# Patient Record
Sex: Male | Born: 1940 | Race: White | Hispanic: No | Marital: Married | Smoking: Former smoker
Health system: Southern US, Community
[De-identification: ages and names within clinical notes are randomized; demographics above are authoritative.]

## PROBLEM LIST (undated history)

## (undated) DIAGNOSIS — E782 Mixed hyperlipidemia: Secondary | ICD-10-CM

## (undated) DIAGNOSIS — E785 Hyperlipidemia, unspecified: Secondary | ICD-10-CM

## (undated) DIAGNOSIS — M1990 Unspecified osteoarthritis, unspecified site: Secondary | ICD-10-CM

## (undated) DIAGNOSIS — I10 Essential (primary) hypertension: Secondary | ICD-10-CM

## (undated) DIAGNOSIS — I2699 Other pulmonary embolism without acute cor pulmonale: Secondary | ICD-10-CM

## (undated) DIAGNOSIS — Z9989 Dependence on other enabling machines and devices: Secondary | ICD-10-CM

## (undated) DIAGNOSIS — E119 Type 2 diabetes mellitus without complications: Secondary | ICD-10-CM

## (undated) DIAGNOSIS — E559 Vitamin D deficiency, unspecified: Secondary | ICD-10-CM

## (undated) DIAGNOSIS — I714 Abdominal aortic aneurysm, without rupture: Secondary | ICD-10-CM

## (undated) DIAGNOSIS — G459 Transient cerebral ischemic attack, unspecified: Secondary | ICD-10-CM

## (undated) DIAGNOSIS — J189 Pneumonia, unspecified organism: Secondary | ICD-10-CM

## (undated) DIAGNOSIS — F0390 Unspecified dementia without behavioral disturbance: Secondary | ICD-10-CM

## (undated) DIAGNOSIS — Z9981 Dependence on supplemental oxygen: Secondary | ICD-10-CM

## (undated) DIAGNOSIS — J45909 Unspecified asthma, uncomplicated: Secondary | ICD-10-CM

## (undated) DIAGNOSIS — G4733 Obstructive sleep apnea (adult) (pediatric): Secondary | ICD-10-CM

## (undated) DIAGNOSIS — K922 Gastrointestinal hemorrhage, unspecified: Secondary | ICD-10-CM

## (undated) DIAGNOSIS — I429 Cardiomyopathy, unspecified: Secondary | ICD-10-CM

## (undated) DIAGNOSIS — I480 Paroxysmal atrial fibrillation: Secondary | ICD-10-CM

## (undated) DIAGNOSIS — Z7189 Other specified counseling: Secondary | ICD-10-CM

## (undated) HISTORY — DX: Other specified counseling: Z71.89

## (undated) HISTORY — DX: Abdominal aortic aneurysm, without rupture: I71.4

## (undated) HISTORY — DX: Vitamin D deficiency, unspecified: E55.9

## (undated) HISTORY — PX: JOINT REPLACEMENT: SHX530

## (undated) HISTORY — PX: BACK SURGERY: SHX140

## (undated) HISTORY — PX: ABDOMINAL SURGERY: SHX537

## (undated) HISTORY — DX: Mixed hyperlipidemia: E78.2

## (undated) HISTORY — PX: TOTAL KNEE ARTHROPLASTY: SHX125

## (undated) HISTORY — DX: Unspecified osteoarthritis, unspecified site: M19.90

---

## 1990-11-13 HISTORY — PX: LUMBAR DISC SURGERY: SHX700

## 1997-11-13 DIAGNOSIS — I714 Abdominal aortic aneurysm, without rupture, unspecified: Secondary | ICD-10-CM

## 1997-11-13 HISTORY — DX: Abdominal aortic aneurysm, without rupture: I71.4

## 1997-11-13 HISTORY — PX: ABDOMINAL SURGERY: SHX537

## 1997-11-13 HISTORY — DX: Abdominal aortic aneurysm, without rupture, unspecified: I71.40

## 2002-09-20 ENCOUNTER — Emergency Department (HOSPITAL_COMMUNITY): Admission: EM | Admit: 2002-09-20 | Discharge: 2002-09-20 | Payer: Self-pay | Admitting: Emergency Medicine

## 2002-11-13 HISTORY — PX: COLONOSCOPY WITH ESOPHAGOGASTRODUODENOSCOPY (EGD): SHX5779

## 2002-12-25 ENCOUNTER — Encounter: Payer: Self-pay | Admitting: Unknown Physician Specialty

## 2002-12-25 ENCOUNTER — Ambulatory Visit (HOSPITAL_COMMUNITY): Admission: RE | Admit: 2002-12-25 | Discharge: 2002-12-25 | Payer: Self-pay | Admitting: Unknown Physician Specialty

## 2003-02-27 ENCOUNTER — Encounter (INDEPENDENT_AMBULATORY_CARE_PROVIDER_SITE_OTHER): Payer: Self-pay | Admitting: Specialist

## 2003-02-27 ENCOUNTER — Ambulatory Visit (HOSPITAL_COMMUNITY): Admission: RE | Admit: 2003-02-27 | Discharge: 2003-02-27 | Payer: Self-pay | Admitting: Gastroenterology

## 2004-10-11 ENCOUNTER — Ambulatory Visit: Payer: Self-pay | Admitting: Family Medicine

## 2004-12-20 ENCOUNTER — Ambulatory Visit: Payer: Self-pay | Admitting: Family Medicine

## 2005-02-09 ENCOUNTER — Ambulatory Visit: Payer: Self-pay | Admitting: Family Medicine

## 2005-03-28 ENCOUNTER — Ambulatory Visit: Payer: Self-pay | Admitting: Family Medicine

## 2005-09-20 ENCOUNTER — Ambulatory Visit: Payer: Self-pay | Admitting: Family Medicine

## 2005-11-22 ENCOUNTER — Ambulatory Visit: Payer: Self-pay | Admitting: Family Medicine

## 2006-01-25 ENCOUNTER — Ambulatory Visit: Payer: Self-pay | Admitting: Family Medicine

## 2006-04-24 ENCOUNTER — Ambulatory Visit: Payer: Self-pay | Admitting: Family Medicine

## 2006-06-18 ENCOUNTER — Ambulatory Visit: Payer: Self-pay | Admitting: Family Medicine

## 2006-10-17 ENCOUNTER — Ambulatory Visit: Payer: Self-pay | Admitting: Family Medicine

## 2006-12-31 ENCOUNTER — Ambulatory Visit: Payer: Self-pay | Admitting: Family Medicine

## 2007-02-25 ENCOUNTER — Ambulatory Visit: Payer: Self-pay | Admitting: Family Medicine

## 2007-04-23 ENCOUNTER — Ambulatory Visit: Payer: Self-pay | Admitting: Family Medicine

## 2011-03-31 NOTE — Op Note (Signed)
Rodney Orozco, Rodney Orozco                          ACCOUNT NO.:  [id number]   MEDICAL RECORD NO.:  [id number]                   PATIENT TYPE:  AMB   LOCATION:  ENDO                                 FACILITY:  [facility]   PHYSICIAN:  John C. Madilyn Fireman, M.D.                 DATE OF BIRTH:  [birth year]   DATE OF PROCEDURE:  02/27/2003  DATE OF DISCHARGE:                                 OPERATIVE REPORT   PROCEDURE:  Esophagogastroduodenoscopy with biopsy.   INDICATION FOR PROCEDURE:  Patient under consideration for EGD after surgery  for perforated ulcer.  We have discussed indications for EGD, and he decided  to hold off on this earlier after treatment for eradication of Helicobacter  but subsequently on CT scan was found to have a questionable inflammatory  process in the right upper quadrant, possibly associated with the hepatic  flexure area of the colon.  We thus decided to perform a colonoscopy for  this reason and for colon cancer screening and decided to pursue EGD as  well.   DESCRIPTION OF PROCEDURE:  The patient was placed in the left lateral  decubitus position and placed on the pulse monitor with continuous low-flow  oxygen delivered by nasal cannula.  He was sedated with 75 mcg IV fentanyl  and 6 mg IV Versed.  The Olympus video endoscope was advanced under direct  vision into the oropharynx and esophagus.  The esophagus was straight and of  normal caliber with the squamocolumnar line at 38 cm.  There was no visible  hiatal hernia, ring, stricture, or other abnormality at the GE junction.  The stomach was entered, and a small amount of liquid secretions were  suctioned from the fundus.  Retroflexed view of the cardia was unremarkable.  The fundus and body appeared normal.  The antrum showed some erythema and  granularity consistent with mild antral gastritis and no focal erosions or  ulcers.  The pylorus was nondeformed and easily allowed passage of the  endoscope tip into the  duodenum.  Both the bulb and second portion were well-  inspected and appeared to be within normal limits, and I could not obviously  identify any surgical changes.  The scope was withdrawn back into the  stomach, and a CLOtest was obtained.  The scope was then withdrawn and the  patient prepared for colonoscopy.  He tolerated the procedure well, and  there were no immediate complications.   IMPRESSION:  1. Antral gastritis.  2. Otherwise normal endoscopy.   PLAN:  1. Await CLOtest  2. Will proceed with colonoscopy.                                               John C. Madilyn Fireman, M.D.    JCH/MEDQ  D:  02/27/2003  T:  02/27/2003  Job:  [id number]

## 2011-03-31 NOTE — Op Note (Signed)
Rodney Orozco, Rodney Orozco                          ACCOUNT NO.:  [id number]   MEDICAL RECORD NO.:  [id number]                   PATIENT TYPE:  AMB   LOCATION:  ENDO                                 FACILITY:  [facility]   PHYSICIAN:  John C. Madilyn Fireman, M.D.                 DATE OF BIRTH:  August 27, 1941   DATE OF PROCEDURE:  02/27/2003  DATE OF DISCHARGE:                                 OPERATIVE REPORT   PROCEDURE:  Colonoscopy.   INDICATIONS FOR PROCEDURE:  Colon cancer screening.  Also, the patient had a  questionable area of inflammation or thickening in right upper quadrant felt  possibly to be involving the hepatic flexure area of the colon.   DESCRIPTION OF PROCEDURE:  The patient was placed in the left lateral  decubitus position and placed on the pulse monitor with continuous low-flow  oxygen delivered by nasal cannula.  He was sedated with 12.5 mcg IV fentanyl  and 2.5 mg IV Versed in addition to the medicine given for the previous EGD.  The Olympus video colonoscope was inserted into the rectum and advanced to  the cecum, confirmed by transillumination at McBurney's point and  visualization of the ileocecal valve and appendiceal orifice.  The prep was  generally good distal to the hepatic flexure, but it was suboptimal proximal  to this, and I could not rule out small lesions from the cecum and ascending  colon less than 1 cm in diameter.  Also, the colon was very long and  redundant, and it took approximately 20 minutes to reach it.  The cecum  appeared normal with no masses, polyps, diverticula, or other mucosal  abnormalities.  At least one diverticulum was seen in the ascending colon.  There was no specific inflammatory changes, mass, or intrinsic narrowing or  suggestion of extrinsic compression in the ascending colon, hepatic flexure  area, or transverse colon, and the remainder of the transverse colon  appeared normal.  The descending colon likewise appeared normal.  Within the  sigmoid colon, there was seen a few scattered diverticula and no other  abnormalities.  Within the rectum there was a 6 mm sessile polyp at  approximately 12 cm from the anal verge, and this was fulgurated by hot  biopsy.  The scope was then withdrawn, and the patient returned to the  recovery room in stable condition.  He tolerated the procedure well, and  there were no immediate complications.   IMPRESSION:  1. Long, redundant colon.  2. Small rectal polyp.  3. Few scattered diverticula.                                               John C. Madilyn Fireman, M.D.    JCH/MEDQ  D:  02/27/2003  T:  02/27/2003  Job:  [id number]

## 2013-10-13 DIAGNOSIS — J189 Pneumonia, unspecified organism: Secondary | ICD-10-CM

## 2013-10-13 HISTORY — DX: Pneumonia, unspecified organism: J18.9

## 2013-12-14 DIAGNOSIS — J189 Pneumonia, unspecified organism: Secondary | ICD-10-CM

## 2013-12-14 HISTORY — DX: Pneumonia, unspecified organism: J18.9

## 2014-01-06 ENCOUNTER — Inpatient Hospital Stay (HOSPITAL_COMMUNITY)
Admission: EM | Admit: 2014-01-06 | Discharge: 2014-01-08 | DRG: 193 | Disposition: A | Discharge status: Home / Self Care | Payer: Medicare HMO | Attending: Internal Medicine | Admitting: Internal Medicine

## 2014-01-06 ENCOUNTER — Emergency Department (HOSPITAL_COMMUNITY): Payer: Medicare HMO

## 2014-01-06 ENCOUNTER — Encounter (HOSPITAL_COMMUNITY): Payer: Self-pay | Admitting: Emergency Medicine

## 2014-01-06 DIAGNOSIS — Z7982 Long term (current) use of aspirin: Secondary | ICD-10-CM

## 2014-01-06 DIAGNOSIS — J101 Influenza due to other identified influenza virus with other respiratory manifestations: Secondary | ICD-10-CM

## 2014-01-06 DIAGNOSIS — IMO0001 Reserved for inherently not codable concepts without codable children: Secondary | ICD-10-CM | POA: Diagnosis present

## 2014-01-06 DIAGNOSIS — J984 Other disorders of lung: Secondary | ICD-10-CM | POA: Diagnosis present

## 2014-01-06 DIAGNOSIS — Z87891 Personal history of nicotine dependence: Secondary | ICD-10-CM

## 2014-01-06 DIAGNOSIS — J9600 Acute respiratory failure, unspecified whether with hypoxia or hypercapnia: Secondary | ICD-10-CM | POA: Diagnosis present

## 2014-01-06 DIAGNOSIS — J441 Chronic obstructive pulmonary disease with (acute) exacerbation: Secondary | ICD-10-CM

## 2014-01-06 DIAGNOSIS — E860 Dehydration: Secondary | ICD-10-CM | POA: Diagnosis present

## 2014-01-06 DIAGNOSIS — J13 Pneumonia due to Streptococcus pneumoniae: Secondary | ICD-10-CM

## 2014-01-06 DIAGNOSIS — E785 Hyperlipidemia, unspecified: Secondary | ICD-10-CM | POA: Diagnosis present

## 2014-01-06 DIAGNOSIS — E1165 Type 2 diabetes mellitus with hyperglycemia: Secondary | ICD-10-CM

## 2014-01-06 DIAGNOSIS — J1100 Influenza due to unidentified influenza virus with unspecified type of pneumonia: Principal | ICD-10-CM | POA: Diagnosis present

## 2014-01-06 DIAGNOSIS — R911 Solitary pulmonary nodule: Secondary | ICD-10-CM | POA: Diagnosis present

## 2014-01-06 DIAGNOSIS — I10 Essential (primary) hypertension: Secondary | ICD-10-CM | POA: Diagnosis present

## 2014-01-06 DIAGNOSIS — J189 Pneumonia, unspecified organism: Secondary | ICD-10-CM

## 2014-01-06 DIAGNOSIS — E119 Type 2 diabetes mellitus without complications: Secondary | ICD-10-CM

## 2014-01-06 DIAGNOSIS — J9601 Acute respiratory failure with hypoxia: Secondary | ICD-10-CM

## 2014-01-06 HISTORY — DX: Pneumonia, unspecified organism: J18.9

## 2014-01-06 HISTORY — DX: Hyperlipidemia, unspecified: E78.5

## 2014-01-06 HISTORY — DX: Essential (primary) hypertension: I10

## 2014-01-06 LAB — CBC WITH DIFFERENTIAL/PLATELET
Basophils Absolute: 0.1 10*3/uL (ref 0.0–0.1)
Basophils Relative: 1 % (ref 0–1)
EOS ABS: 0.2 10*3/uL (ref 0.0–0.7)
Eosinophils Relative: 2 % (ref 0–5)
HCT: 45 % (ref 39.0–52.0)
HEMOGLOBIN: 15.5 g/dL (ref 13.0–17.0)
LYMPHS PCT: 8 % — AB (ref 12–46)
Lymphs Abs: 0.8 10*3/uL (ref 0.7–4.0)
MCH: 30.7 pg (ref 26.0–34.0)
MCHC: 34.4 g/dL (ref 30.0–36.0)
MCV: 89.1 fL (ref 78.0–100.0)
MONOS PCT: 11 % (ref 3–12)
Monocytes Absolute: 1.1 10*3/uL — ABNORMAL HIGH (ref 0.1–1.0)
NEUTROS PCT: 79 % — AB (ref 43–77)
Neutro Abs: 7.8 10*3/uL — ABNORMAL HIGH (ref 1.7–7.7)
Platelets: 235 10*3/uL (ref 150–400)
RBC: 5.05 MIL/uL (ref 4.22–5.81)
RDW: 13.3 % (ref 11.5–15.5)
WBC: 9.8 10*3/uL (ref 4.0–10.5)

## 2014-01-06 LAB — PRO B NATRIURETIC PEPTIDE: Pro B Natriuretic peptide (BNP): 301.5 pg/mL — ABNORMAL HIGH (ref 0–125)

## 2014-01-06 LAB — COMPREHENSIVE METABOLIC PANEL
ALK PHOS: 98 U/L (ref 39–117)
ALT: 33 U/L (ref 0–53)
AST: 30 U/L (ref 0–37)
Albumin: 4.3 g/dL (ref 3.5–5.2)
BILIRUBIN TOTAL: 0.3 mg/dL (ref 0.3–1.2)
BUN: 12 mg/dL (ref 6–23)
CHLORIDE: 94 meq/L — AB (ref 96–112)
CO2: 24 mEq/L (ref 19–32)
Calcium: 9 mg/dL (ref 8.4–10.5)
Creatinine, Ser: 1.01 mg/dL (ref 0.50–1.35)
GFR calc non Af Amer: 73 mL/min — ABNORMAL LOW (ref >90)
GFR, EST AFRICAN AMERICAN: 85 mL/min — AB (ref >90)
GLUCOSE: 224 mg/dL — AB (ref 70–99)
POTASSIUM: 4 meq/L (ref 3.7–5.3)
Sodium: 133 mEq/L — ABNORMAL LOW (ref 137–147)
Total Protein: 8.3 g/dL (ref 6.0–8.3)

## 2014-01-06 LAB — MRSA PCR SCREENING: MRSA by PCR: NEGATIVE

## 2014-01-06 LAB — TROPONIN I: Troponin I: <0.3 ng/mL (ref <0.30)

## 2014-01-06 LAB — INFLUENZA PANEL BY PCR (TYPE A & B)
H1N1 flu by pcr: NOT DETECTED
INFLAPCR: NEGATIVE
Influenza B By PCR: POSITIVE — AB

## 2014-01-06 LAB — RAPID STREP SCREEN (MED CTR MEBANE ONLY): Streptococcus, Group A Screen (Direct): POSITIVE — AB

## 2014-01-06 LAB — GLUCOSE, CAPILLARY: GLUCOSE-CAPILLARY: 251 mg/dL — AB (ref 70–99)

## 2014-01-06 MED ORDER — INSULIN ASPART 100 UNIT/ML ~~LOC~~ SOLN
0.0000 [IU] | Freq: Every day | SUBCUTANEOUS | Status: DC
  Administered 2014-01-06: 3 [IU] via SUBCUTANEOUS

## 2014-01-06 MED ORDER — VANCOMYCIN HCL 10 G IV SOLR
2000.0000 mg | Freq: Once | INTRAVENOUS | Status: AC
  Administered 2014-01-06: 2000 mg via INTRAVENOUS
  Filled 2014-01-06: qty 2000

## 2014-01-06 MED ORDER — INSULIN ASPART 100 UNIT/ML ~~LOC~~ SOLN
0.0000 [IU] | Freq: Three times a day (TID) | SUBCUTANEOUS | Status: DC
Start: 2014-01-07 — End: 2014-01-07
  Administered 2014-01-07: 8 [IU] via SUBCUTANEOUS

## 2014-01-06 MED ORDER — ASPIRIN EC 81 MG PO TBEC
81.0000 mg | DELAYED_RELEASE_TABLET | Freq: Every day | ORAL | Status: DC
  Administered 2014-01-06 – 2014-01-08 (×3): 81 mg via ORAL
  Filled 2014-01-06 (×3): qty 1

## 2014-01-06 MED ORDER — ALBUTEROL SULFATE (2.5 MG/3ML) 0.083% IN NEBU: 2.5000 mg | INHALATION_SOLUTION | Freq: Four times a day (QID) | RESPIRATORY_TRACT | Status: DC

## 2014-01-06 MED ORDER — SODIUM CHLORIDE 0.9 % IV BOLUS (SEPSIS)
1000.0000 mL | Freq: Once | INTRAVENOUS | Status: AC
  Administered 2014-01-06: 1000 mL via INTRAVENOUS

## 2014-01-06 MED ORDER — ACETAMINOPHEN 325 MG PO TABS
650.0000 mg | ORAL_TABLET | Freq: Four times a day (QID) | ORAL | Status: DC | PRN
Start: 2014-01-06 — End: 2014-01-08
  Administered 2014-01-06: 650 mg via ORAL
  Filled 2014-01-06: qty 2

## 2014-01-06 MED ORDER — SODIUM CHLORIDE 0.9 % IV SOLN
INTRAVENOUS | Status: AC
  Administered 2014-01-07: 05:00:00 via INTRAVENOUS

## 2014-01-06 MED ORDER — GUAIFENESIN-DM 100-10 MG/5ML PO SYRP: 5.0000 mL | ORAL_SOLUTION | ORAL | Status: DC | PRN

## 2014-01-06 MED ORDER — ACETAMINOPHEN 650 MG RE SUPP: 650.0000 mg | Freq: Four times a day (QID) | RECTAL | Status: DC | PRN

## 2014-01-06 MED ORDER — GUAIFENESIN ER 600 MG PO TB12
600.0000 mg | ORAL_TABLET | Freq: Two times a day (BID) | ORAL | Status: DC
  Administered 2014-01-06 – 2014-01-08 (×4): 600 mg via ORAL
  Filled 2014-01-06 (×4): qty 1

## 2014-01-06 MED ORDER — ONDANSETRON HCL 4 MG/2ML IJ SOLN: 4.0000 mg | Freq: Four times a day (QID) | INTRAMUSCULAR | Status: DC | PRN

## 2014-01-06 MED ORDER — IPRATROPIUM-ALBUTEROL 0.5-2.5 (3) MG/3ML IN SOLN
3.0000 mL | Freq: Four times a day (QID) | RESPIRATORY_TRACT | Status: DC
  Administered 2014-01-06 – 2014-01-07 (×5): 3 mL via RESPIRATORY_TRACT
  Filled 2014-01-06 (×5): qty 3

## 2014-01-06 MED ORDER — IPRATROPIUM BROMIDE 0.02 % IN SOLN: 0.5000 mg | Freq: Four times a day (QID) | RESPIRATORY_TRACT | Status: DC

## 2014-01-06 MED ORDER — DEXTROSE 5 % IV SOLN
INTRAVENOUS | Status: AC
  Filled 2014-01-06: qty 1

## 2014-01-06 MED ORDER — SODIUM CHLORIDE 0.9 % IV SOLN
1000.0000 mL | INTRAVENOUS | Status: DC
  Administered 2014-01-06: 1000 mL via INTRAVENOUS

## 2014-01-06 MED ORDER — SODIUM CHLORIDE 0.9 % IV SOLN: INTRAVENOUS | Status: DC

## 2014-01-06 MED ORDER — VANCOMYCIN HCL 10 G IV SOLR
1500.0000 mg | Freq: Two times a day (BID) | INTRAVENOUS | Status: DC
  Administered 2014-01-07: 1500 mg via INTRAVENOUS
  Filled 2014-01-06 (×3): qty 1500

## 2014-01-06 MED ORDER — DEXTROSE 5 % IV SOLN
1.0000 g | Freq: Three times a day (TID) | INTRAVENOUS | Status: DC
  Administered 2014-01-07 (×2): 1 g via INTRAVENOUS
  Filled 2014-01-06 (×5): qty 1

## 2014-01-06 MED ORDER — METHYLPREDNISOLONE SODIUM SUCC 125 MG IJ SOLR
125.0000 mg | Freq: Once | INTRAMUSCULAR | Status: AC
  Administered 2014-01-06: 125 mg via INTRAVENOUS
  Filled 2014-01-06: qty 2

## 2014-01-06 MED ORDER — IOHEXOL 350 MG/ML SOLN
100.0000 mL | Freq: Once | INTRAVENOUS | Status: AC | PRN
  Administered 2014-01-06: 100 mL via INTRAVENOUS

## 2014-01-06 MED ORDER — DILTIAZEM HCL ER COATED BEADS 240 MG PO CP24
240.0000 mg | ORAL_CAPSULE | Freq: Every day | ORAL | Status: DC
  Administered 2014-01-06 – 2014-01-08 (×3): 240 mg via ORAL
  Filled 2014-01-06 (×3): qty 1

## 2014-01-06 MED ORDER — SODIUM CHLORIDE 0.9 % IJ SOLN
3.0000 mL | Freq: Two times a day (BID) | INTRAMUSCULAR | Status: DC
  Administered 2014-01-07: 3 mL via INTRAVENOUS

## 2014-01-06 MED ORDER — LISINOPRIL 10 MG PO TABS
40.0000 mg | ORAL_TABLET | Freq: Every day | ORAL | Status: DC
  Administered 2014-01-06 – 2014-01-08 (×3): 40 mg via ORAL
  Filled 2014-01-06 (×3): qty 4

## 2014-01-06 MED ORDER — ONDANSETRON HCL 4 MG PO TABS: 4.0000 mg | ORAL_TABLET | Freq: Four times a day (QID) | ORAL | Status: DC | PRN

## 2014-01-06 MED ORDER — ALBUTEROL SULFATE (2.5 MG/3ML) 0.083% IN NEBU
5.0000 mg | INHALATION_SOLUTION | Freq: Once | RESPIRATORY_TRACT | Status: AC
  Administered 2014-01-06: 5 mg via RESPIRATORY_TRACT
  Filled 2014-01-06: qty 6

## 2014-01-06 MED ORDER — DEXTROSE 5 % IV SOLN
2.0000 g | Freq: Once | INTRAVENOUS | Status: AC
  Administered 2014-01-06: 2 g via INTRAVENOUS

## 2014-01-06 MED ORDER — ALBUTEROL SULFATE (2.5 MG/3ML) 0.083% IN NEBU: 2.5000 mg | INHALATION_SOLUTION | RESPIRATORY_TRACT | Status: DC | PRN

## 2014-01-06 MED ORDER — METHYLPREDNISOLONE SODIUM SUCC 125 MG IJ SOLR
60.0000 mg | Freq: Four times a day (QID) | INTRAMUSCULAR | Status: DC
  Administered 2014-01-07 – 2014-01-08 (×7): 60 mg via INTRAVENOUS
  Filled 2014-01-06 (×7): qty 2

## 2014-01-06 MED ORDER — ENOXAPARIN SODIUM 60 MG/0.6ML ~~LOC~~ SOLN
50.0000 mg | SUBCUTANEOUS | Status: DC
  Administered 2014-01-06 – 2014-01-07 (×2): 50 mg via SUBCUTANEOUS
  Filled 2014-01-06 (×2): qty 0.6

## 2014-01-06 NOTE — ED Notes (Signed)
Feels weak,  Cough, sob, Had pneumonia in Dec.    Nausea.

## 2014-01-06 NOTE — ED Provider Notes (Signed)
CSN: [id number]     Arrival date & time 01/06/14  1531 History  This chart was scribed for Ezequiel Essex, MD by Ludger Nutting, ED Scribe. This patient was seen in room APA04/APA04 and the patient's care was started 3:47 PM.    Chief Complaint  Patient presents with  . Shortness of Breath      The history is provided by the patient. No language interpreter was used.    HPI Comments: Rodney Orozco is a 73 y.o. male with past medical history of pneumonia, DM, and HTN who presents to the Emergency Department complaining of 1 day of gradual onset, gradually worsening, constant SOB. He reports associated nausea, 1 episode of postussive vomiting, subjective fevers, and cough productive of sputum.  He denies sick contacts. He denies history of cardiac disease or COPD. He denies abdominal pain, diarrhea, chest pain. He does not use an inhaler at home. He is a former smoker.    Past Medical History  Diagnosis Date  . Diabetes mellitus without complication   . Pneumonia December 2014    Had hemoptysis and admitted at [facility].  . Hypertension   . Dyslipidemia    Past Surgical History  Procedure Laterality Date  . Knee surgery    . Back surgery    . Abdominal surgery     History reviewed. No pertinent family history. History  Substance Use Topics  . Smoking status: Former Smoker -- 1.00 packs/day for 55 years    Types: Cigarettes    Quit date: 11/07/2013  . Smokeless tobacco: Not on file  . Alcohol Use: No    Review of Systems  A complete 10 system review of systems was obtained and all systems are negative except as noted in the HPI and PMH.    Allergies  Lipitor and Penicillins  Home Medications   Current Outpatient Rx  Name  Route  Sig  Dispense  Refill  . aspirin EC 81 MG tablet   Oral   Take 81 mg by mouth daily.         Marland Kitchen diltiazem (CARDIZEM CD) 240 MG 24 hr capsule   Oral   Take 240 mg by mouth daily.         Marland Kitchen glipiZIDE (GLUCOTROL XL) 10 MG 24 hr  tablet   Oral   Take 10 mg by mouth daily.         . hydrochlorothiazide (HYDRODIURIL) 25 MG tablet   Oral   Take 25 mg by mouth daily.         Marland Kitchen lisinopril (PRINIVIL,ZESTRIL) 40 MG tablet   Oral   Take 40 mg by mouth daily.         . metFORMIN (GLUCOPHAGE-XR) 500 MG 24 hr tablet   Oral   Take 1,000 mg by mouth 2 (two) times daily.         . rosuvastatin (CRESTOR) 20 MG tablet   Oral   Take 20 mg by mouth 3 (three) times a week.          BP 161/82  Pulse 118  Temp(Src) 98.5 F (36.9 C) (Oral)  Resp 24  Ht 5\' 10"  (1.778 m)  Wt 238 lb (107.956 kg)  BMI 34.15 kg/m2  SpO2 92% Physical Exam  Nursing note and vitals reviewed. Constitutional: He is oriented to person, place, and time. He appears well-developed and well-nourished.  HENT:  Head: Normocephalic and atraumatic.  Neck: Normal range of motion. Neck supple.  Cardiovascular: Regular rhythm, normal  heart sounds and intact distal pulses.  Tachycardia present.   Pulmonary/Chest: Tachypnea noted. No respiratory distress. He has decreased breath sounds (at the bases). He has no wheezes.  No leg swelling.   Abdominal: He exhibits no distension.  Musculoskeletal: He exhibits no edema.  Neurological: He is alert and oriented to person, place, and time.  Skin: Skin is warm and dry.  Psychiatric: He has a normal mood and affect.    ED Course  Procedures (including critical care time)  DIAGNOSTIC STUDIES: Oxygen Saturation is 92% on RA, low by my interpretation.    COORDINATION OF CARE: 3:55 PM Discussed treatment plan with pt at bedside and pt agreed to plan.   Labs Review Labs Reviewed  CBC WITH DIFFERENTIAL - Abnormal; Notable for the following:    Neutrophils Relative % 79 (*)    Neutro Abs 7.8 (*)    Lymphocytes Relative 8 (*)    Monocytes Absolute 1.1 (*)    All other components within normal limits  COMPREHENSIVE METABOLIC PANEL - Abnormal; Notable for the following:    Sodium 133 (*)     Chloride 94 (*)    Glucose, Bld 224 (*)    GFR calc non Af Amer 73 (*)    GFR calc Af Amer 85 (*)    All other components within normal limits  PRO B NATRIURETIC PEPTIDE - Abnormal; Notable for the following:    Pro B Natriuretic peptide (BNP) 301.5 (*)    All other components within normal limits  RAPID STREP SCREEN  TROPONIN I  INFLUENZA PANEL BY PCR (TYPE A & B, H1N1)   Imaging Review Dg Chest 2 View  01/06/2014   CLINICAL DATA:  Shortness of breath.  Nausea.  EXAM: CHEST  2 VIEW  COMPARISON:  11/08/2013.  FINDINGS: Interval patchy opacity at the right lung base. Stable calcified pleural plaque on the right. Interval minimal linear atelectasis at the left lung base. Mildly progressive diffuse peribronchial thickening. Thoracic spine degenerative changes. Normal sized heart.  IMPRESSION: 1. Interval patchy opacity at the right lung base suspicious for pneumonia. 2. Progressive bronchitic changes. 3. Interval minimal linear atelectasis at the left lung base. 4. Stable right calcified pleural plaque compatible with previous asbestos exposure.   Electronically Signed   By: Enrique Sack M.D.   On: 01/06/2014 16:17    EKG Interpretation    Date/Time:  Tuesday January 06 2014 15:32:25 EST Ventricular Rate:  101 PR Interval:  148 QRS Duration: 106 QT Interval:  354 QTC Calculation: 459 R Axis:   -53 Text Interpretation:  Sinus tachycardia Pulmonary disease pattern Left anterior fascicular block Abnormal ECG No previous ECGs available No previous ECGs available Confirmed by Wyvonnia Dusky  MD, Taylormarie Register ([id number]) on 01/06/2014 3:56:08 PM            MDM   Final diagnoses:  HCAP (healthcare-associated pneumonia)   Shortness of breath since last night with generalized weakness and nonproductive cough. No chest pain or fever. One episode of posttussive emesis. No abdominal pain.  he is hypoxic on room air to 80%. Mild tachycardia and mild tachypnea. Chest x-ray shows right lower lobe pneumonia.  He was last hospitalized in December at [facility].  Antibiotics started for HCAP.  Presumably has undiagnosed COPD and is given nebulizers and steroids as well. Abnormal noncontrast CT at [facility] in December.  CTA done today in setting of tachycardia and hypoxia.  No PE seen today. 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 Given oxygen requirement and increased work  of breathing, will need admission. D/w Dr. Algis Liming.  I personally performed the services described in this documentation, which was scribed in my presence. The recorded information has been reviewed and is accurate.   Ezequiel Essex, MD 01/06/14 [phone]

## 2014-01-06 NOTE — H&P (Addendum)
History and Physical  Rodney Orozco LFY:[id number] DOB: 1941-06-10 DOA: 01/06/2014  Referring physician: EDP PCP: Rodney Mustache, MD  Outpatient Specialists:  1. None  Chief Complaint: Cough, fever and difficulty breathing  HPI: Rodney Orozco is a 73 y.o. male with history of DM 2, HTN, dyslipidemia, former heavy smoker, hospitalized at [facility] 12/26-12/29 for? Atypical pneumonia. 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 He was treated with moxifloxacin for hemoptysis presumed to be secondary to LUL. Patient states that he has done well until 2 days ago when he started noticing increased urinary frequency without dysuria, fevers-unknown severity, no chills, nonproductive cough, no hemoptysis, worsening dyspnea on exertion, generalized weakness, nausea, poor appetite but no chest pain. Patient also had mild sore throat but denies earache or headache. No vomiting, abdominal pain or diarrhea. In the ED, afebrile, mild tachycardia, hypoxic, glucose 224, troponin x1 negative, probe BNP 301.5 and chest x-ray suggestive of right lung base pneumonia, progressive bronchitic changes. Patient has received a dose of IV vancomycin and aztreonam for presumed healthcare associated pneumonia. EDP has requested CTA chest to further evaluate given the recent significantly abnormal CT chest without contrast, significant hypoxia in the absence of fever or leukocytosis.  Review of Systems: All systems reviewed and apart from history of presenting illness, are negative.  Past Medical History  Diagnosis Date  . Diabetes mellitus without complication   . Pneumonia December  2014    Had hemoptysis and admitted at [facility].  . Hypertension   . Dyslipidemia    Past Surgical History  Procedure Laterality Date  . Knee surgery    . Back surgery    . Abdominal surgery     Social History:  reports that he quit smoking about 1 months ago. His smoking use included Cigarettes. He has a 55 pack-year smoking history. He does not have any smokeless tobacco history on file. He reports that he does not drink alcohol or use illicit drugs. Married. Independent of activities of daily living.  Allergies  Allergen Reactions  . Lipitor [Atorvastatin]   . Penicillins     History reviewed. No pertinent family history. negative family history.  Prior to Admission medications   Medication Sig Start Date End Date Taking? Authorizing Provider  aspirin EC 81 MG tablet Take 81 mg by mouth daily. 11/11/13  Yes Historical Provider, MD  diltiazem (CARDIZEM CD) 240 MG 24 hr capsule Take 240 mg by mouth daily.   Yes Historical Provider, MD  glipiZIDE (GLUCOTROL XL) 10 MG 24 hr tablet Take 10 mg by mouth daily.   Yes Historical Provider, MD  hydrochlorothiazide (HYDRODIURIL) 25 MG tablet Take 25 mg by mouth daily. 11/11/13 11/11/14 Yes Historical Provider, MD  lisinopril (PRINIVIL,ZESTRIL) 40 MG tablet Take 40 mg by mouth daily.   Yes Historical Provider, MD  metFORMIN (GLUCOPHAGE-XR) 500 MG 24 hr tablet Take 1,000 mg by mouth 2 (two) times daily.   Yes Historical Provider, MD  rosuvastatin (CRESTOR) 20 MG tablet Take 20 mg by mouth 3 (three) times a week.    Historical Provider, MD   Physical Exam: Filed Vitals:   01/06/14 1535 01/06/14 1635 01/06/14 1650 01/06/14 1800  BP: 152/100  144/73 161/82  Pulse: 104  114  118  Temp: 98.5 F (36.9 C)  98.5 F (36.9 C)   TempSrc: Oral     Resp: 18  24 24   Height: 5\' 10"  (1.778 m)     Weight: 107.956 kg (238 lb)     SpO2: 92% 95% 93% 92%     General exam: Moderately built and nourished elderly male patient, lying propped up on  the gurney in mild respiratory distress.  Head, eyes and ENT: Nontraumatic and normocephalic. Pupils equally reacting to light and accommodation. Oral mucosa dry. Throat mildly congested but without drainage.  Neck: Supple. No JVD, carotid bruit or thyromegaly.  Lymphatics: No lymphadenopathy.  Respiratory system: Reduced breath sounds bilaterally, especially in the bases with scattered bibasal coarse crackle but without wheezing or rhonchi. Mild increased work of breathing.  Cardiovascular system: S1 and S2 heard, mild regular tachycardia. No JVD, murmurs, gallops, clicks or pedal edema.  Gastrointestinal system: Abdomen is nondistended, soft and nontender. Normal bowel sounds heard. No organomegaly or masses appreciated.  Central nervous system: Alert and oriented. No focal neurological deficits.  Extremities: Symmetric 5 x 5 power. Peripheral pulses symmetrically felt.   Skin: No rashes or acute findings.  Musculoskeletal system: Negative exam.  Psychiatry: Pleasant and cooperative.   Labs on Admission:  Basic Metabolic Panel:  Recent Labs Lab 01/06/14 1617  NA 133*  K 4.0  CL 94*  CO2 24  GLUCOSE 224*  BUN 12  CREATININE 1.01  CALCIUM 9.0   Liver Function Tests:  Recent Labs Lab 01/06/14 1617  AST 30  ALT 33  ALKPHOS 98  BILITOT 0.3  PROT 8.3  ALBUMIN 4.3   No results found for this basename: LIPASE, AMYLASE,  in the last 168 hours No results found for this basename: AMMONIA,  in the last 168 hours CBC:  Recent Labs Lab 01/06/14 1617  WBC 9.8  NEUTROABS 7.8*  HGB 15.5  HCT 45.0  MCV 89.1  PLT 235   Cardiac Enzymes:  Recent Labs Lab 01/06/14 1617  TROPONINI <0.30    BNP (last 3 results)  Recent Labs  01/06/14 1617  PROBNP 301.5*   CBG: No results found for this basename: GLUCAP,  in the last 168 hours  Radiological Exams on Admission: Dg Chest 2 View  01/06/2014   CLINICAL DATA:  Shortness of breath.  Nausea.  EXAM: CHEST  2  VIEW  COMPARISON:  11/08/2013.  FINDINGS: Interval patchy opacity at the right lung base. Stable calcified pleural plaque on the right. Interval minimal linear atelectasis at the left lung base. Mildly progressive diffuse peribronchial thickening. Thoracic spine degenerative changes. Normal sized heart.  IMPRESSION: 1. Interval patchy opacity at the right lung base suspicious for pneumonia. 2. Progressive bronchitic changes. 3. Interval minimal linear atelectasis at the left lung base. 4. Stable right calcified pleural plaque compatible with previous asbestos exposure.   Electronically Signed   By: Enrique Sack M.D.   On: 01/06/2014 16:17    EKG: Independently reviewed. Sinus tachycardia at 101 beats per minute, LAD,? RBBB no acute changes.  Assessment/Plan Principal Problem:   HCAP (healthcare-associated pneumonia) Active Problems:   Diabetes mellitus without complication   Hypertension   Dyslipidemia   Acute respiratory failure with hypoxia   COPD with acute exacerbation   Healthcare-associated pneumonia   1. Presumed healthcare associated pneumonia: Placed on droplet isolation pending influenza panel PCR. Continue IV vancomycin and aztreonam. Followup CTA chest results. 2. COPD exacerbation: Likely precipitated by pneumonia. Oxygen, bronchodilator nebulization, antibiotics and IV Solu-Medrol.  Does not have formal diagnosis of COPD-will need PFTs as outpatient once acute issues have resolved. 3. Acute hypoxic respiratory failure: Secondary to pneumonia, COPD and underlying chronic lung disease. Management as above. 4. Dehydration: Secondary to poor oral intake. Brief IV fluids 5. Uncontrolled type II DM: Hold oral hypoglycemics. SSI. 6. Hypertension: Controlled. Continue home medications 7. History of dyslipidemia 8. Chronic lung disease by CT chest: Follow up CTA chest. No history of asbestos exposure. Former heavy smoker. 83. Former smoker     Code Status: Full  Family Communication:  Discussed with spouse at bedside  Disposition Plan: Home in medically stable   Time spent: 12 minutes  Libero Puthoff, MD, FACP, [place]. [facility] Pager [phone]  If 7PM-7AM, please contact night-coverage [email or web address] Password [facility] 01/06/2014, 6:38 PM

## 2014-01-06 NOTE — Progress Notes (Signed)
ANTIBIOTIC CONSULT NOTE - INITIAL  Pharmacy Consult for Vancomycin Indication: pneumonia  Allergies  Allergen Reactions  . Lipitor [Atorvastatin]   . Penicillins    Patient Measurements: Height: 5\' 10"  (177.8 cm) Weight: 238 lb (107.956 kg) IBW/kg (Calculated) : 73  Vital Signs: Temp: 98.5 F (36.9 C) (02/24 1535) Temp src: Oral (02/24 1535) BP: 152/100 mmHg (02/24 1535) Pulse Rate: 104 (02/24 1535) Intake/Output from previous day:   Intake/Output from this shift:    Labs: No results found for this basename: WBC, HGB, PLT, LABCREA, CREATININE,  in the last 72 hours CrCl is unknown because no creatinine reading has been taken. No results found for this basename: VANCOTROUGH, VANCOPEAK, VANCORANDOM, GENTTROUGH, GENTPEAK, GENTRANDOM, TOBRATROUGH, TOBRAPEAK, TOBRARND, AMIKACINPEAK, AMIKACINTROU, AMIKACIN,  in the last 72 hours   Microbiology: No results found for this or any previous visit (from the past 720 hour(s)).  Medical History: Past Medical History  Diagnosis Date  . Diabetes mellitus without complication   . Pneumonia   . Hypertension    Medications:  Scheduled:  . albuterol  5 mg Nebulization Once   Assessment: 73yo male admitted with cough and SOB.  Pt treated for pneumonia in December per notes.  CrCl is unknown because no creatinine reading has been taken.  Goal of Therapy:  Vancomycin trough level 15-20 mcg/ml  Plan:  Vancomycin 2000mg  IV now x 1 dose (loading dose) then F/U SCr and renal fxn for maintenance dose. Check trough at steady state Monitor labs, renal fxn, and cultures  Hart Robinsons A 01/06/2014,4:33 PM

## 2014-01-06 NOTE — Progress Notes (Signed)
ANTIBIOTIC CONSULT NOTE - INITIAL  Pharmacy Consult for Vancomycin and Aztreonam Indication: pneumonia  Allergies  Allergen Reactions  . Lipitor [Atorvastatin]   . Penicillins    Patient Measurements: Height: 5\' 10"  (177.8 cm) Weight: 238 lb (107.956 kg) IBW/kg (Calculated) : 73  Vital Signs: Temp: 98.5 F (36.9 C) (02/24 1650) Temp src: Oral (02/24 1535) BP: 161/82 mmHg (02/24 1800) Pulse Rate: 118 (02/24 1800) Intake/Output from previous day:   Intake/Output from this shift:    Labs:  Recent Labs  01/06/14 1617  WBC 9.8  HGB 15.5  PLT 235  CREATININE 1.01   Estimated Creatinine Clearance: 83.7 ml/min (by C-G formula based on Cr of 1.01). No results found for this basename: VANCOTROUGH, VANCOPEAK, VANCORANDOM, GENTTROUGH, GENTPEAK, GENTRANDOM, TOBRATROUGH, TOBRAPEAK, TOBRARND, AMIKACINPEAK, AMIKACINTROU, AMIKACIN,  in the last 72 hours   Microbiology: No results found for this or any previous visit (from the past 720 hour(s)).  Medical History: Past Medical History  Diagnosis Date  . Diabetes mellitus without complication   . Pneumonia December 2014    Had hemoptysis and admitted at [facility].  . Hypertension   . Dyslipidemia    Medications:  Scheduled:    Assessment: 73yo male admitted with cough and SOB.  Pt treated for pneumonia in December per notes.  Estimated Creatinine Clearance: 83.7 ml/min (by C-G formula based on Cr of 1.01).  Goal of Therapy:  Vancomycin trough level 15-20 mcg/ml  Plan:  Vancomycin 2000mg  IV now x 1 dose (loading dose) then Vancomycin 1500mg  IV q12hrs. Check trough at steady state Aztreonam 1gm IV q8hrs Monitor labs, renal fxn, and cultures  Hart Robinsons A 01/06/2014,7:03 PM

## 2014-01-07 DIAGNOSIS — J111 Influenza due to unidentified influenza virus with other respiratory manifestations: Secondary | ICD-10-CM

## 2014-01-07 DIAGNOSIS — J13 Pneumonia due to Streptococcus pneumoniae: Secondary | ICD-10-CM

## 2014-01-07 DIAGNOSIS — J101 Influenza due to other identified influenza virus with other respiratory manifestations: Secondary | ICD-10-CM | POA: Diagnosis present

## 2014-01-07 LAB — GLUCOSE, CAPILLARY
GLUCOSE-CAPILLARY: 371 mg/dL — AB (ref 70–99)
Glucose-Capillary: 298 mg/dL — ABNORMAL HIGH (ref 70–99)
Glucose-Capillary: 234 mg/dL — ABNORMAL HIGH (ref 70–99)
Glucose-Capillary: 298 mg/dL — ABNORMAL HIGH (ref 70–99)

## 2014-01-07 LAB — BASIC METABOLIC PANEL
BUN: 15 mg/dL (ref 6–23)
CALCIUM: 8.7 mg/dL (ref 8.4–10.5)
CO2: 23 mEq/L (ref 19–32)
CREATININE: 0.93 mg/dL (ref 0.50–1.35)
Chloride: 99 mEq/L (ref 96–112)
GFR calc Af Amer: >90 mL/min (ref >90)
GFR, EST NON AFRICAN AMERICAN: 83 mL/min — AB (ref >90)
Glucose, Bld: 315 mg/dL — ABNORMAL HIGH (ref 70–99)
Potassium: 3.6 mEq/L — ABNORMAL LOW (ref 3.7–5.3)
SODIUM: 138 meq/L (ref 137–147)

## 2014-01-07 LAB — CBC
HCT: 45.9 % (ref 39.0–52.0)
Hemoglobin: 15.6 g/dL (ref 13.0–17.0)
MCH: 30.8 pg (ref 26.0–34.0)
MCHC: 34 g/dL (ref 30.0–36.0)
MCV: 90.5 fL (ref 78.0–100.0)
PLATELETS: 259 10*3/uL (ref 150–400)
RBC: 5.07 MIL/uL (ref 4.22–5.81)
RDW: 13.8 % (ref 11.5–15.5)
WBC: 10.7 10*3/uL — ABNORMAL HIGH (ref 4.0–10.5)

## 2014-01-07 MED ORDER — IPRATROPIUM-ALBUTEROL 0.5-2.5 (3) MG/3ML IN SOLN
3.0000 mL | Freq: Four times a day (QID) | RESPIRATORY_TRACT | Status: DC
  Administered 2014-01-08 (×2): 3 mL via RESPIRATORY_TRACT
  Filled 2014-01-07 (×2): qty 3

## 2014-01-07 MED ORDER — INSULIN ASPART 100 UNIT/ML ~~LOC~~ SOLN
0.0000 [IU] | Freq: Three times a day (TID) | SUBCUTANEOUS | Status: DC
  Administered 2014-01-07: 7 [IU] via SUBCUTANEOUS
  Administered 2014-01-07: 20 [IU] via SUBCUTANEOUS
  Administered 2014-01-08: 15 [IU] via SUBCUTANEOUS
  Administered 2014-01-08: 7 [IU] via SUBCUTANEOUS

## 2014-01-07 MED ORDER — OSELTAMIVIR PHOSPHATE 75 MG PO CAPS
75.0000 mg | ORAL_CAPSULE | Freq: Two times a day (BID) | ORAL | Status: DC
  Administered 2014-01-07 – 2014-01-08 (×3): 75 mg via ORAL
  Filled 2014-01-07 (×3): qty 1

## 2014-01-07 MED ORDER — LEVOFLOXACIN IN D5W 750 MG/150ML IV SOLN
750.0000 mg | INTRAVENOUS | Status: DC
  Administered 2014-01-07 – 2014-01-08 (×2): 750 mg via INTRAVENOUS
  Filled 2014-01-07 (×2): qty 150

## 2014-01-07 MED ORDER — INSULIN ASPART 100 UNIT/ML ~~LOC~~ SOLN
0.0000 [IU] | Freq: Every day | SUBCUTANEOUS | Status: DC
  Administered 2014-01-07: 3 [IU] via SUBCUTANEOUS

## 2014-01-07 MED ORDER — INSULIN GLARGINE 100 UNIT/ML ~~LOC~~ SOLN
10.0000 [IU] | Freq: Every day | SUBCUTANEOUS | Status: DC
  Administered 2014-01-07 – 2014-01-08 (×2): 10 [IU] via SUBCUTANEOUS
  Filled 2014-01-07 (×4): qty 0.1

## 2014-01-07 NOTE — Progress Notes (Signed)
Pt transferred to unit from ICU. Pt in stable condition and in NAD. Will continue to monitor.

## 2014-01-07 NOTE — Progress Notes (Signed)
[facility] PROGRESS NOTE  Rodney Orozco NLZ:[id number] DOB: 1940-11-29 DOA: 01/06/2014 PCP: Sherrie Mustache, MD  Assessment/Plan: 1. Acute respiratory failure. COPD exacerbation and underlying pneumonia. Weight down oxygen as tolerated. 2. Pneumococcal pneumonia. The patient had rapid strep positive for pneumococcus. We'll de-escalate antibiotics to Levaquin. 3. COPD exacerbation. Continue antibiotics, bronchodilators. Continue intravenous steroids at current dose for today. We'll start to de-escalate tomorrow. 4. Influenza B. Patient is on Tamiflu 5. Type 2 diabetes. Blood sugars are elevated, especially with steroids. We'll start the patient on Lantus.  6. Hypertension. Stable 7. Right lower lobe lung nodule. Patient will need followup CT chest in 3 months.  Code Status: full code Family Communication: discussed with patient and wife at the bedside Disposition Plan: discharge home once improved   Consultants:    Procedures:    Antibiotics:  Vancomycin 2/24>>2/25  Aztreonam 2/24>>2/25  Levaquin 2/25>>  HPI/Subjective: Feeling better, breathing improving, has non productive cough, also has sore throat.  Objective: Filed Vitals:   01/07/14 0900  BP: 136/75  Pulse:   Temp:   Resp: 19    Intake/Output Summary (Last 24 hours) at 01/07/14 1034 Last data filed at 01/07/14 0509  Gross per 24 hour  Intake   1266 ml  Output      0 ml  Net   1266 ml   Filed Weights   01/06/14 1900 01/06/14 1959 01/07/14 0500  Weight: 104.9 kg (231 lb 4.2 oz) 104.9 kg (231 lb 4.2 oz) 104.1 kg (229 lb 8 oz)    Exam:   General:  NAD  Cardiovascular: s1, S2 RRR  Respiratory: diminished breath sounds bilaterally, no wheezing  Abdomen: soft, nt, nd, bs+  Musculoskeletal:  No edema b/l   Data Reviewed: Basic Metabolic Panel:  Recent Labs Lab 01/06/14 1617 01/07/14 0434  NA 133* 138  K 4.0 3.6*  CL 94* 99  CO2 24 23  GLUCOSE 224* 315*  BUN 12 15   CREATININE 1.01 0.93  CALCIUM 9.0 8.7   Liver Function Tests:  Recent Labs Lab 01/06/14 1617  AST 30  ALT 33  ALKPHOS 98  BILITOT 0.3  PROT 8.3  ALBUMIN 4.3   No results found for this basename: LIPASE, AMYLASE,  in the last 168 hours No results found for this basename: AMMONIA,  in the last 168 hours CBC:  Recent Labs Lab 01/06/14 1617 01/07/14 0434  WBC 9.8 10.7*  NEUTROABS 7.8*  --   HGB 15.5 15.6  HCT 45.0 45.9  MCV 89.1 90.5  PLT 235 259   Cardiac Enzymes:  Recent Labs Lab 01/06/14 1617  TROPONINI <0.30   BNP (last 3 results)  Recent Labs  01/06/14 1617  PROBNP 301.5*   CBG:  Recent Labs Lab 01/06/14 2124 01/07/14 0748  GLUCAP 251* 298*    Recent Results (from the past 240 hour(s))  RAPID STREP SCREEN     Status: Abnormal   Collection Time    01/06/14  7:20 PM      Result Value Ref Range Status   Streptococcus, Group A Screen (Direct) POSITIVE (*) NEGATIVE Final  MRSA PCR SCREENING     Status: None   Collection Time    01/06/14  7:46 PM      Result Value Ref Range Status   MRSA by PCR NEGATIVE  NEGATIVE Final   Comment:            The GeneXpert MRSA Assay (FDA     approved for NASAL specimens  only), is one component of a     comprehensive MRSA colonization     surveillance program. It is not     intended to diagnose MRSA     infection nor to guide or     monitor treatment for     MRSA infections.     Studies: Dg Chest 2 View  01/06/2014   CLINICAL DATA:  Shortness of breath.  Nausea.  EXAM: CHEST  2 VIEW  COMPARISON:  11/08/2013.  FINDINGS: Interval patchy opacity at the right lung base. Stable calcified pleural plaque on the right. Interval minimal linear atelectasis at the left lung base. Mildly progressive diffuse peribronchial thickening. Thoracic spine degenerative changes. Normal sized heart.  IMPRESSION: 1. Interval patchy opacity at the right lung base suspicious for pneumonia. 2. Progressive bronchitic changes. 3.  Interval minimal linear atelectasis at the left lung base. 4. Stable right calcified pleural plaque compatible with previous asbestos exposure.   Electronically Signed   By: Enrique Sack M.D.   On: 01/06/2014 16:17   Ct Angio Chest Pe W/cm &/or Wo Cm  01/06/2014   CLINICAL DATA:  Short of breath. Productive cough. Fever. Tachycardia. Hypoxia. Diabetes. Hypertension. Ex-smoker.  EXAM: CT ANGIOGRAPHY CHEST WITH CONTRAST  TECHNIQUE: Multidetector CT imaging of the chest was performed using the standard protocol during bolus administration of intravenous contrast. Multiplanar CT image reconstructions and MIPs were obtained to evaluate the vascular anatomy.  CONTRAST:  165mL OMNIPAQUE IOHEXOL 350 MG/ML SOLN  COMPARISON:  DG CHEST 2 VIEW dated 01/06/2014; DG CHEST 2V dated 11/08/2013  FINDINGS: Lungs/Pleura: Mild motion degradation, especially superiorly. Moderate centrilobular emphysema.  Inferior right upper lobe 8 mm pulmonary nodule on image 43/series 5. This may have fat density within.  Volume loss in the right lung base with partially calcified right-sided pleural plaques.  Minimal left-sided pleural plaque formation with calcification.  Heart/Mediastinum: The quality of this examination for evaluation of pulmonary embolism is moderate. Limitations include motion and suboptimal bolus timing. The bolus is centered in the SVC. No pulmonary embolism to the large segmental level. Smaller emboli cannot be excluded.  Aortic and branch vessel atherosclerosis. Ulcerative plaque involves the descending thoracic aorta. Mild cardiomegaly, without pericardial effusion. Multivessel coronary artery atherosclerosis. Pulmonary artery enlargement, with the outflow tract measuring 4.5 cm.  Borderline left paratracheal adenopathy 11 mm on image 37. Subcarinal node measures 1.6 cm on image 48. A right hilar node measures 1.5 cm on image 42.  1.6 cm prevascular node  Upper Abdomen:  Moderate hepatic steatosis.  Splenules.   Bones/Musculoskeletal:  No acute osseous abnormality.  Review of the MIP images confirms the above findings.  IMPRESSION: 1. Moderate quality evaluation for pulmonary embolism. No embolism to the large segmental level. 2. Moderate emphysema with right base volume loss. Right greater than left partially calcified pleural plaques, consistent with asbestos related pleural disease. 3. 8 mm right lower lobe lung nodule. This may have fat density and given its well-circumscribed appearance, could represent a hamartoma. . Recommend followup at 3 months with thin section CT to evaluate for hamartoma and exclude interval growth. 4. Pulmonary artery enlargement suggests pulmonary arterial hypertension. 5. Cardiomegaly with coronary artery atherosclerosis. 6. Mediastinal adenopathy. Favored to be reactive. Recommend attention on follow-up. 7. Hepatic steatosis.   Electronically Signed   By: Abigail Miyamoto M.D.   On: 01/06/2014 18:56    Scheduled Meds: . aspirin EC  81 mg Oral Daily  . aztreonam  1 g Intravenous Q8H  . diltiazem  240 mg Oral Daily  . enoxaparin (LOVENOX) injection  50 mg Subcutaneous Q24H  . guaiFENesin  600 mg Oral BID  . insulin aspart  0-15 Units Subcutaneous TID WC  . insulin aspart  0-5 Units Subcutaneous QHS  . ipratropium-albuterol  3 mL Nebulization Q6H  . lisinopril  40 mg Oral Daily  . methylPREDNISolone (SOLU-MEDROL) injection  60 mg Intravenous Q6H  . oseltamivir  75 mg Oral BID  . sodium chloride  3 mL Intravenous Q12H  . vancomycin  1,500 mg Intravenous Q12H   Continuous Infusions:   Principal Problem:   Streptococcus pneumoniae pneumonia Active Problems:   Diabetes mellitus without complication   Hypertension   Dyslipidemia   Acute respiratory failure with hypoxia   COPD with acute exacerbation   Healthcare-associated pneumonia   Influenza B    Time spent: 83mins    Jazlin Tapscott  [facility] Pager [phone]. If 7PM-7AM, please contact night-coverage at  [email or web address], password [facility] 01/07/2014, 10:34 AM  LOS: 1 day

## 2014-01-07 NOTE — Progress Notes (Signed)
Report called to S. Heath,RN. Patient transferred to 320 in stable condition via wheelchair.

## 2014-01-07 NOTE — Progress Notes (Signed)
Inpatient Diabetes Program Recommendations  AACE/ADA: New Consensus Statement on Inpatient Glycemic Control (2013)  Target Ranges:  Prepandial:   less than 140 mg/dL      Peak postprandial:   less than 180 mg/dL (1-2 hours)      Critically ill patients:  140 - 180 mg/dL   Results for Rodney Orozco, Rodney Orozco (MRN [id number]) as of 01/07/2014 07:48  Ref. Range 01/06/2014 16:17 01/07/2014 04:34  Glucose Latest Range: 70-99 mg/dL 224 (H) 315 (H)   Diabetes history: DM2 Outpatient Diabetes medications: Glipizide 10 mg daily, Metformin 1000 mg BID Current orders for Inpatient glycemic control: Novolog 0-15 units AC, Novolog 0-5 units HS  Inpatient Diabetes Program Recommendations Insulin - Basal: Please consider ordering Levemir 10 units daily (starting now). Correction (SSI): Please consider increasing Novolog correction scale to resistant scale. HgbA1C: Please order an A1C to evaluate glycemic control over the past 2-3 months.  Thanks, Barnie Alderman, RN, MSN, CCRN Diabetes Coordinator Inpatient Diabetes Program [phone] (Team Pager) [phone] (AP office) [phone] [facility] office)

## 2014-01-07 NOTE — Progress Notes (Signed)
UR chart review completed.  

## 2014-01-08 LAB — BASIC METABOLIC PANEL
BUN: 20 mg/dL (ref 6–23)
CALCIUM: 8.6 mg/dL (ref 8.4–10.5)
CO2: 24 meq/L (ref 19–32)
Chloride: 104 mEq/L (ref 96–112)
Creatinine, Ser: 0.84 mg/dL (ref 0.50–1.35)
GFR calc Af Amer: >90 mL/min (ref >90)
GFR, EST NON AFRICAN AMERICAN: 87 mL/min — AB (ref >90)
Glucose, Bld: 248 mg/dL — ABNORMAL HIGH (ref 70–99)
Potassium: 4 mEq/L (ref 3.7–5.3)
Sodium: 140 mEq/L (ref 137–147)

## 2014-01-08 LAB — GLUCOSE, CAPILLARY
Glucose-Capillary: 250 mg/dL — ABNORMAL HIGH (ref 70–99)
Glucose-Capillary: 306 mg/dL — ABNORMAL HIGH (ref 70–99)

## 2014-01-08 LAB — CBC
HCT: 40.9 % (ref 39.0–52.0)
Hemoglobin: 14 g/dL (ref 13.0–17.0)
MCH: 30.9 pg (ref 26.0–34.0)
MCHC: 34.2 g/dL (ref 30.0–36.0)
MCV: 90.3 fL (ref 78.0–100.0)
PLATELETS: 247 10*3/uL (ref 150–400)
RBC: 4.53 MIL/uL (ref 4.22–5.81)
RDW: 13.8 % (ref 11.5–15.5)
WBC: 22.1 10*3/uL — ABNORMAL HIGH (ref 4.0–10.5)

## 2014-01-08 MED ORDER — PREDNISONE 10 MG PO TABS: ORAL_TABLET | ORAL | Status: DC

## 2014-01-08 MED ORDER — LEVOFLOXACIN 750 MG PO TABS: 750.0000 mg | ORAL_TABLET | Freq: Every day | ORAL | Status: DC

## 2014-01-08 MED ORDER — ALBUTEROL SULFATE HFA 108 (90 BASE) MCG/ACT IN AERS
2.0000 | INHALATION_SPRAY | RESPIRATORY_TRACT | Status: DC | PRN
Start: 2014-01-08 — End: 2016-04-25

## 2014-01-08 MED ORDER — OSELTAMIVIR PHOSPHATE 75 MG PO CAPS: 75.0000 mg | ORAL_CAPSULE | Freq: Two times a day (BID) | ORAL | Status: DC

## 2014-01-08 NOTE — Discharge Instructions (Signed)

## 2014-01-08 NOTE — Progress Notes (Signed)
ANTIBIOTIC CONSULT NOTE  Pharmacy Consult for Levaquin Indication: pneumonia  Allergies  Allergen Reactions  . Lipitor [Atorvastatin]   . Penicillins    Patient Measurements: Height: 5\' 10"  (177.8 cm) Weight: 231 lb (104.781 kg) IBW/kg (Calculated) : 73  Vital Signs: Temp: 97.5 F (36.4 C) (02/26 0422) Temp src: Oral (02/26 0422) BP: 156/77 mmHg (02/26 0729) Pulse Rate: 77 (02/26 0422) Intake/Output from previous day: 02/25 0701 - 02/26 0700 In: 1350 [P.O.:1200; IV Piggyback:150] Out: -  Intake/Output from this shift:    Labs:  Recent Labs  01/06/14 1617 01/07/14 0434 01/08/14 0528  WBC 9.8 10.7* 22.1*  HGB 15.5 15.6 14.0  PLT 235 259 247  CREATININE 1.01 0.93 0.84   Estimated Creatinine Clearance: 99.2 ml/min (by C-G formula based on Cr of 0.84). No results found for this basename: VANCOTROUGH, Corlis Leak, VANCORANDOM, [place], GENTPEAK, [place], [place], TOBRAPEAK, TOBRARND, AMIKACINPEAK, AMIKACINTROU, AMIKACIN,  in the last 72 hours   Microbiology: Recent Results (from the past 720 hour(s))  RAPID STREP SCREEN     Status: Abnormal   Collection Time    01/06/14  7:20 PM      Result Value Ref Range Status   Streptococcus, Group A Screen (Direct) POSITIVE (*) NEGATIVE Final  MRSA PCR SCREENING     Status: None   Collection Time    01/06/14  7:46 PM      Result Value Ref Range Status   MRSA by PCR NEGATIVE  NEGATIVE Final   Comment:            The GeneXpert MRSA Assay (FDA     approved for NASAL specimens     only), is one component of a     comprehensive MRSA colonization     surveillance program. It is not     intended to diagnose MRSA     infection nor to guide or     monitor treatment for     MRSA infections.    Medical History: Past Medical History  Diagnosis Date  . Diabetes mellitus without complication   . Pneumonia December 2014    Had hemoptysis and admitted at [facility].  . Hypertension   . Dyslipidemia     Medications:  Scheduled:  . aspirin EC  81 mg Oral Daily  . diltiazem  240 mg Oral Daily  . enoxaparin (LOVENOX) injection  50 mg Subcutaneous Q24H  . guaiFENesin  600 mg Oral BID  . insulin aspart  0-20 Units Subcutaneous TID WC  . insulin aspart  0-5 Units Subcutaneous QHS  . insulin glargine  10 Units Subcutaneous Daily  . ipratropium-albuterol  3 mL Nebulization QID  . levofloxacin (LEVAQUIN) IV  750 mg Intravenous Q24H  . lisinopril  40 mg Oral Daily  . methylPREDNISolone (SOLU-MEDROL) injection  60 mg Intravenous Q6H  . oseltamivir  75 mg Oral BID  . sodium chloride  3 mL Intravenous Q12H   Assessment: 73yo male +pneumococcal pneumonia and Influenza B.  Antibiotics have been de-escalated to Levaquin & Tamiflu.  Clinically improving.  Renal function has improved to baseline.   Tamiflu 2/25>> Levaquin 2/25>> Vancomycin 2/24>>2/25 Aztreonam 2/24>>2/25  Goal of Therapy:  Eradicate infection.  Plan:  Levaquin 750mg  po q24h Duration of therapy per MD- recommend 7 days No further dose adjustments anticipated.  Pharmacy to sign off.  Re-consult as needed.   Biagio Borg 01/08/2014,10:19 AM

## 2014-01-08 NOTE — Discharge Summary (Signed)
Physician Discharge Summary  Rodney Orozco PZW:[id number] DOB: 01-21-1941 DOA: 01/06/2014  PCP: Sherrie Mustache, MD  Admit date: 01/06/2014 Discharge date: 01/08/2014  Time spent: 40 minutes  Recommendations for Outpatient Follow-up:  1. Follow up with primary care physician in 1 week 2. Repeat CBC in 1 week to evaluate leukocytosis 3. Repeat Chest CT with contrast in 3 months to evaluate RLL lung nodule  Discharge Diagnoses:  Principal Problem:   Streptococcus pneumoniae pneumonia Active Problems:   Diabetes mellitus without complication   Hypertension   Dyslipidemia   Acute respiratory failure with hypoxia   COPD with acute exacerbation   Healthcare-associated pneumonia   Influenza B   Discharge Condition: improved  Diet recommendation: low salt, low carb  Filed Weights   01/06/14 1959 01/07/14 0500 01/08/14 0422  Weight: 104.9 kg (231 lb 4.2 oz) 104.1 kg (229 lb 8 oz) 104.781 kg (231 lb)    History of present illness:  Rodney Orozco is a 73 y.o. male with history of DM 2, HTN, dyslipidemia, former heavy smoker, hospitalized at [facility] 12/26-12/29 for? Atypical pneumonia. 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 He was treated with moxifloxacin for hemoptysis presumed to be secondary to LUL. Patient states that he has done well until 2 days ago when he started noticing increased urinary frequency without dysuria, fevers-unknown severity, no chills, nonproductive cough, no hemoptysis, worsening dyspnea on exertion, generalized weakness, nausea, poor appetite but no chest pain. Patient also had mild sore throat but denies earache or  headache. No vomiting, abdominal pain or diarrhea. In the ED, afebrile, mild tachycardia, hypoxic, glucose 224, troponin x1 negative, probe BNP 301.5 and chest x-ray suggestive of right lung base pneumonia, progressive bronchitic changes. Patient has received a dose of IV vancomycin and aztreonam for presumed healthcare associated pneumonia. EDP has requested CTA chest to further evaluate given the recent significantly abnormal CT chest without contrast, significant hypoxia in the absence of fever or leukocytosis.   Hospital Course:  This patient was admitted to the hospital for a variety of symptoms including shortness of breath, fevers, generalized weakness. He has a long history of tobacco use and likely has some underlying COPD. He was admitted with acute respiratory failure felt to be multifactorial from COPD exacerbation as well as underlying pneumococcal pneumonia. He was also found to be positive for influenza. The patient was started on antibiotics, steroids and bronchodilators. He was also started on Tamiflu. CT of the chest was done which did not indicate any underlying pulmonary embolus but did reveal a right lower lobe nodule which will need outpatient surveillance. The patient has significantly improved. He is ambulating on room air without difficulty and feels ready to go home. He'll be transitioned to oral antibiotics, prednisone taper and will be given an albuterol inhaler. He has a followup with his primary care physician in one week. It was noted that he had a significant leukocytosis, but remained afebrile does not appear toxic. This was felt to be related to steroids and will need to be followed up.  Procedures:  none  Consultations:  none  Discharge Exam: Filed Vitals:   01/08/14 1311  BP: 154/64  Pulse: 92  Temp:   Resp: 20    General: NAD  Cardiovascular: S1, s2 ,rrr Respiratory: CTA B  Discharge Instructions      Discharge Orders   Future Orders Complete By  Expires   Call MD for:  difficulty breathing, headache or visual disturbances  As directed    Call MD for:  temperature >100.4  As directed    Diet - low sodium heart healthy  As directed    Increase activity slowly  As directed        Medication List         albuterol 108 (90 BASE) MCG/ACT inhaler  Commonly known as:  PROVENTIL HFA;VENTOLIN HFA  Inhale 2 puffs into the lungs every 4 (four) hours as needed for wheezing or shortness of breath.     aspirin EC 81 MG tablet  Take 81 mg by mouth daily.     diltiazem 240 MG 24 hr capsule  Commonly known as:  CARDIZEM CD  Take 240 mg by mouth daily.     glipiZIDE 10 MG 24 hr tablet  Commonly known as:  GLUCOTROL XL  Take 10 mg by mouth daily.     hydrochlorothiazide 25 MG tablet  Commonly known as:  HYDRODIURIL  Take 25 mg by mouth daily.     levofloxacin 750 MG tablet  Commonly known as:  LEVAQUIN  Take 1 tablet (750 mg total) by mouth daily.  Start taking on:  01/09/2014     lisinopril 40 MG tablet  Commonly known as:  PRINIVIL,ZESTRIL  Take 40 mg by mouth daily.     metFORMIN 500 MG 24 hr tablet  Commonly known as:  GLUCOPHAGE-XR  Take 1,000 mg by mouth 2 (two) times daily.     oseltamivir 75 MG capsule  Commonly known as:  TAMIFLU  Take 1 capsule (75 mg total) by mouth 2 (two) times daily.     predniSONE 10 MG tablet  Commonly known as:  DELTASONE  Starting 2/27: take 40mg  po daily for 2 days then 30mg  po daily for 2 days then 20mg  po daily for 2 days then 10mg  po daily for 2 days then stop     rosuvastatin 20 MG tablet  Commonly known as:  CRESTOR  Take 20 mg by mouth 3 (three) times a week.       Allergies  Allergen Reactions  . Lipitor [Atorvastatin]   . Penicillins    Follow-up Information   Follow up with Sherrie Mustache, MD. Schedule an appointment as soon as possible for a visit in 2 weeks.   Specialty:  Family Medicine   Contact information:   [place]  [phone]        The results of significant diagnostics from this hospitalization (including imaging, microbiology, ancillary and laboratory) are listed below for reference.    Significant Diagnostic Studies: Dg Chest 2 View  01/06/2014   CLINICAL DATA:  Shortness of breath.  Nausea.  EXAM: CHEST  2 VIEW  COMPARISON:  11/08/2013.  FINDINGS: Interval patchy opacity at the right lung base. Stable calcified pleural plaque on the right. Interval minimal linear atelectasis at the left lung base. Mildly progressive diffuse peribronchial thickening. Thoracic spine degenerative changes. Normal sized heart.  IMPRESSION: 1. Interval patchy opacity at the right lung base suspicious for pneumonia. 2. Progressive bronchitic changes. 3. Interval minimal linear atelectasis at the left lung base. 4. Stable right calcified pleural plaque compatible with previous asbestos exposure.   Electronically Signed   By: Enrique Sack M.D.   On: 01/06/2014 16:17  Ct Angio Chest Pe W/cm &/or Wo Cm  01/06/2014   CLINICAL DATA:  Short of breath. Productive cough. Fever. Tachycardia. Hypoxia. Diabetes. Hypertension. Ex-smoker.  EXAM: CT ANGIOGRAPHY CHEST WITH CONTRAST  TECHNIQUE: Multidetector CT imaging of the chest was performed using the standard protocol during bolus administration of intravenous contrast. Multiplanar CT image reconstructions and MIPs were obtained to evaluate the vascular anatomy.  CONTRAST:  171mL OMNIPAQUE IOHEXOL 350 MG/ML SOLN  COMPARISON:  DG CHEST 2 VIEW dated 01/06/2014; DG CHEST 2V dated 11/08/2013  FINDINGS: Lungs/Pleura: Mild motion degradation, especially superiorly. Moderate centrilobular emphysema.  Inferior right upper lobe 8 mm pulmonary nodule on image 43/series 5. This may have fat density within.  Volume loss in the right lung base with partially calcified right-sided pleural plaques.  Minimal left-sided pleural plaque formation with calcification.  Heart/Mediastinum: The quality of this  examination for evaluation of pulmonary embolism is moderate. Limitations include motion and suboptimal bolus timing. The bolus is centered in the SVC. No pulmonary embolism to the large segmental level. Smaller emboli cannot be excluded.  Aortic and branch vessel atherosclerosis. Ulcerative plaque involves the descending thoracic aorta. Mild cardiomegaly, without pericardial effusion. Multivessel coronary artery atherosclerosis. Pulmonary artery enlargement, with the outflow tract measuring 4.5 cm.  Borderline left paratracheal adenopathy 11 mm on image 37. Subcarinal node measures 1.6 cm on image 48. A right hilar node measures 1.5 cm on image 42.  1.6 cm prevascular node  Upper Abdomen:  Moderate hepatic steatosis.  Splenules.  Bones/Musculoskeletal:  No acute osseous abnormality.  Review of the MIP images confirms the above findings.  IMPRESSION: 1. Moderate quality evaluation for pulmonary embolism. No embolism to the large segmental level. 2. Moderate emphysema with right base volume loss. Right greater than left partially calcified pleural plaques, consistent with asbestos related pleural disease. 3. 8 mm right lower lobe lung nodule. This may have fat density and given its well-circumscribed appearance, could represent a hamartoma. . Recommend followup at 3 months with thin section CT to evaluate for hamartoma and exclude interval growth. 4. Pulmonary artery enlargement suggests pulmonary arterial hypertension. 5. Cardiomegaly with coronary artery atherosclerosis. 6. Mediastinal adenopathy. Favored to be reactive. Recommend attention on follow-up. 7. Hepatic steatosis.   Electronically Signed   By: Abigail Miyamoto M.D.   On: 01/06/2014 18:56    Microbiology: Recent Results (from the past 240 hour(s))  RAPID STREP SCREEN     Status: Abnormal   Collection Time    01/06/14  7:20 PM      Result Value Ref Range Status   Streptococcus, Group A Screen (Direct) POSITIVE (*) NEGATIVE Final  MRSA PCR SCREENING      Status: None   Collection Time    01/06/14  7:46 PM      Result Value Ref Range Status   MRSA by PCR NEGATIVE  NEGATIVE Final   Comment:            The GeneXpert MRSA Assay (FDA     approved for NASAL specimens     only), is one component of a     comprehensive MRSA colonization     surveillance program. It is not     intended to diagnose MRSA     infection nor to guide or     monitor treatment for     MRSA infections.     Labs: Basic Metabolic Panel:  Recent Labs Lab 01/06/14 1617 01/07/14 0434 01/08/14 0528  NA 133* 138 140  K 4.0 3.6*  4.0  CL 94* 99 104  CO2 24 23 24   GLUCOSE 224* 315* 248*  BUN 12 15 20   CREATININE 1.01 0.93 0.84  CALCIUM 9.0 8.7 8.6   Liver Function Tests:  Recent Labs Lab 01/06/14 1617  AST 30  ALT 33  ALKPHOS 98  BILITOT 0.3  PROT 8.3  ALBUMIN 4.3   No results found for this basename: LIPASE, AMYLASE,  in the last 168 hours No results found for this basename: AMMONIA,  in the last 168 hours CBC:  Recent Labs Lab 01/06/14 1617 01/07/14 0434 01/08/14 0528  WBC 9.8 10.7* 22.1*  NEUTROABS 7.8*  --   --   HGB 15.5 15.6 14.0  HCT 45.0 45.9 40.9  MCV 89.1 90.5 90.3  PLT 235 259 247   Cardiac Enzymes:  Recent Labs Lab 01/06/14 1617  TROPONINI <0.30   BNP: BNP (last 3 results)  Recent Labs  01/06/14 1617  PROBNP 301.5*   CBG:  Recent Labs Lab 01/07/14 1147 01/07/14 1704 01/07/14 2115 01/08/14 0739 01/08/14 1155  GLUCAP 371* 234* 298* 250* 306*       Signed:  MEMON,JEHANZEB  [facility] 01/08/2014, 7:25 PM

## 2014-01-08 NOTE — Progress Notes (Signed)
Pt is to be discharged home today. Pt is in NAD, IV is out, all paperwork has been reviewed/discussed with patient, and there are no questions/concerns at this time. Assessment is unchanged from this morning. Pt is to be accompanied downstairs by staff and family via wheelchair.  

## 2014-01-08 NOTE — Progress Notes (Signed)
Ambulated with pt in hallway and checked O2 saturations. Pt never dropped below 91% on RA while ambulating. Will continue to monitor.

## 2014-01-08 NOTE — Progress Notes (Signed)
Inpatient Diabetes Program Recommendations  AACE/ADA: New Consensus Statement on Inpatient Glycemic Control (2013)  Target Ranges:  Prepandial:   less than 140 mg/dL      Peak postprandial:   less than 180 mg/dL (1-2 hours)      Critically ill patients:  140 - 180 mg/dL   Results for Rodney Orozco, Rodney Orozco (MRN [id number]) as of 01/08/2014 07:56  Ref. Range 01/07/2014 07:48 01/07/2014 11:47 01/07/2014 17:04 01/07/2014 21:15 01/08/2014 07:39  Glucose-Capillary Latest Range: 70-99 mg/dL 298 (H) 371 (H) 234 (H) 298 (H) 250 (H)   Diabetes history: DM2 Outpatient Diabetes medications: Glipizide 10 mg daily, Metformin 1000 mg BID  Current orders for Inpatient glycemic control: Novolog 0-20 units AC, Novolog 0-5 units HS, Lantus 10 units daily  Inpatient Diabetes Program Recommendations Insulin - Basal: Please consider increasing Lantus to 20 units daily. Insulin - Meal Coverage: Please consider ordering Novolog 6 units TID with meals for meal coverage.   Thanks, Barnie Alderman, RN, MSN, CCRN Diabetes Coordinator Inpatient Diabetes Program [phone] (Team Pager) [phone] (AP office) [phone] [facility] office)

## 2014-01-08 NOTE — Progress Notes (Deleted)
Physician Discharge Summary  [name] PFX:[id number] DOB: 1941/02/03 DOA: 01/06/2014  PCP: Sherrie Mustache, MD  Admit date: 01/06/2014 Discharge date: 01/08/2014  Time spent: 40 minutes  Recommendations for Outpatient Follow-up:  1. Follow up with primary care physician in 1 week 2. Repeat CBC in 1 week to evaluate leukocytosis 3. Repeat Chest CT with contrast in 3 months to evaluate RLL lung nodule  Discharge Diagnoses:  Principal Problem:   Streptococcus pneumoniae pneumonia Active Problems:   Diabetes mellitus without complication   Hypertension   Dyslipidemia   Acute respiratory failure with hypoxia   COPD with acute exacerbation   Healthcare-associated pneumonia   Influenza B   Discharge Condition: improved  Diet recommendation: low salt, low carb  Filed Weights   01/06/14 1959 01/07/14 0500 01/08/14 0422  Weight: 104.9 kg (231 lb 4.2 oz) 104.1 kg (229 lb 8 oz) 104.781 kg (231 lb)    History of present illness:  Rodney Orozco is a 73 y.o. male with history of DM 2, HTN, dyslipidemia, former heavy smoker, hospitalized at [facility] 12/26-12/29 for? Atypical pneumonia. 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 He was treated with moxifloxacin for hemoptysis presumed to be secondary to LUL. Patient states that he has done well until 2 days ago when he started noticing increased urinary frequency without dysuria, fevers-unknown severity, no chills, nonproductive cough, no hemoptysis, worsening dyspnea on exertion, generalized weakness, nausea, poor appetite but no chest pain. Patient also had mild sore throat but denies earache or  headache. No vomiting, abdominal pain or diarrhea. In the ED, afebrile, mild tachycardia, hypoxic, glucose 224, troponin x1 negative, probe BNP 301.5 and chest x-ray suggestive of right lung base pneumonia, progressive bronchitic changes. Patient has received a dose of IV vancomycin and aztreonam for presumed healthcare associated pneumonia. EDP has requested CTA chest to further evaluate given the recent significantly abnormal CT chest without contrast, significant hypoxia in the absence of fever or leukocytosis.   Hospital Course:  This patient was admitted to the hospital for a variety of symptoms including shortness of breath, fevers, generalized weakness. He has a long history of tobacco use and likely has some underlying COPD. He was admitted with acute respiratory failure felt to be multifactorial from COPD exacerbation as well as underlying pneumococcal pneumonia. He was also found to be positive for influenza. The patient was started on antibiotics, steroids and bronchodilators. He was also started on Tamiflu. CT of the chest was done which did not indicate any underlying pulmonary embolus but did reveal a right lower lobe nodule which will need outpatient surveillance. The patient has significantly improved. He is ambulating on room air without difficulty and feels ready to go home. He'll be transitioned to oral antibiotics, prednisone taper and will be given an albuterol inhaler. He has a followup with his primary care physician in one week. It was noted that he had a significant leukocytosis, but remained afebrile does not appear toxic. This was felt to be related to steroids and will need to be followed up.  Procedures:  none  Consultations:  none  Discharge Exam: Filed Vitals:   01/08/14 1311  BP: 154/64  Pulse: 92  Temp:   Resp: 20    General: NAD  Cardiovascular: S1, s2 ,rrr Respiratory: CTA B  Discharge Instructions  Discharge Orders   Future Orders Complete By Expires    Call MD for:  difficulty breathing, headache or visual disturbances  As directed    Call MD for:  temperature >100.4  As directed    Diet - low sodium heart healthy  As directed    Increase activity slowly  As directed        Medication List         albuterol 108 (90 BASE) MCG/ACT inhaler  Commonly known as:  PROVENTIL HFA;VENTOLIN HFA  Inhale 2 puffs into the lungs every 4 (four) hours as needed for wheezing or shortness of breath.     aspirin EC 81 MG tablet  Take 81 mg by mouth daily.     diltiazem 240 MG 24 hr capsule  Commonly known as:  CARDIZEM CD  Take 240 mg by mouth daily.     glipiZIDE 10 MG 24 hr tablet  Commonly known as:  GLUCOTROL XL  Take 10 mg by mouth daily.     hydrochlorothiazide 25 MG tablet  Commonly known as:  HYDRODIURIL  Take 25 mg by mouth daily.     levofloxacin 750 MG tablet  Commonly known as:  LEVAQUIN  Take 1 tablet (750 mg total) by mouth daily.  Start taking on:  01/09/2014     lisinopril 40 MG tablet  Commonly known as:  PRINIVIL,ZESTRIL  Take 40 mg by mouth daily.     metFORMIN 500 MG 24 hr tablet  Commonly known as:  GLUCOPHAGE-XR  Take 1,000 mg by mouth 2 (two) times daily.     oseltamivir 75 MG capsule  Commonly known as:  TAMIFLU  Take 1 capsule (75 mg total) by mouth 2 (two) times daily.     predniSONE 10 MG tablet  Commonly known as:  DELTASONE  Starting 2/27: take 40mg  po daily for 2 days then 30mg  po daily for 2 days then 20mg  po daily for 2 days then 10mg  po daily for 2 days then stop     rosuvastatin 20 MG tablet  Commonly known as:  CRESTOR  Take 20 mg by mouth 3 (three) times a week.       Allergies  Allergen Reactions  . Lipitor [Atorvastatin]   . Penicillins        Follow-up Information   Follow up with Sherrie Mustache, MD. Schedule an appointment as soon as possible for a visit in 2 weeks.   Specialty:  Family Medicine   Contact information:   [place] [place]  [id number] [phone]        The results of significant diagnostics from this hospitalization (including imaging, microbiology, ancillary and laboratory) are listed below for reference.    Significant Diagnostic Studies: Dg Chest 2 View  01/06/2014   CLINICAL DATA:  Shortness of breath.  Nausea.  EXAM: CHEST  2 VIEW  COMPARISON:  11/08/2013.  FINDINGS: Interval patchy opacity at the right lung base. Stable calcified pleural plaque on the right. Interval minimal linear atelectasis at the left lung base. Mildly progressive diffuse peribronchial thickening. Thoracic spine degenerative changes. Normal sized heart.  IMPRESSION: 1. Interval patchy opacity at the right lung base suspicious for pneumonia. 2. Progressive bronchitic changes. 3. Interval minimal linear atelectasis at the left lung base. 4. Stable right calcified pleural plaque compatible with previous asbestos exposure.   Electronically Signed   By: Enrique Sack M.D.   On: 01/06/2014 16:17  Ct Angio Chest Pe W/cm &/or Wo Cm  01/06/2014   CLINICAL DATA:  Short of breath. Productive cough. Fever. Tachycardia. Hypoxia. Diabetes. Hypertension. Ex-smoker.  EXAM: CT ANGIOGRAPHY CHEST WITH CONTRAST  TECHNIQUE: Multidetector CT imaging of the chest was performed using the standard protocol during bolus administration of intravenous contrast. Multiplanar CT image reconstructions and MIPs were obtained to evaluate the vascular anatomy.  CONTRAST:  145mL OMNIPAQUE IOHEXOL 350 MG/ML SOLN  COMPARISON:  DG CHEST 2 VIEW dated 01/06/2014; DG CHEST 2V dated 11/08/2013  FINDINGS: Lungs/Pleura: Mild motion degradation, especially superiorly. Moderate centrilobular emphysema.  Inferior right upper lobe 8 mm pulmonary nodule on image 43/series 5. This may have fat density within.  Volume loss in the right lung base with partially calcified right-sided pleural plaques.  Minimal left-sided pleural plaque formation with calcification.  Heart/Mediastinum: The quality of this  examination for evaluation of pulmonary embolism is moderate. Limitations include motion and suboptimal bolus timing. The bolus is centered in the SVC. No pulmonary embolism to the large segmental level. Smaller emboli cannot be excluded.  Aortic and branch vessel atherosclerosis. Ulcerative plaque involves the descending thoracic aorta. Mild cardiomegaly, without pericardial effusion. Multivessel coronary artery atherosclerosis. Pulmonary artery enlargement, with the outflow tract measuring 4.5 cm.  Borderline left paratracheal adenopathy 11 mm on image 37. Subcarinal node measures 1.6 cm on image 48. A right hilar node measures 1.5 cm on image 42.  1.6 cm prevascular node  Upper Abdomen:  Moderate hepatic steatosis.  Splenules.  Bones/Musculoskeletal:  No acute osseous abnormality.  Review of the MIP images confirms the above findings.  IMPRESSION: 1. Moderate quality evaluation for pulmonary embolism. No embolism to the large segmental level. 2. Moderate emphysema with right base volume loss. Right greater than left partially calcified pleural plaques, consistent with asbestos related pleural disease. 3. 8 mm right lower lobe lung nodule. This may have fat density and given its well-circumscribed appearance, could represent a hamartoma. . Recommend followup at 3 months with thin section CT to evaluate for hamartoma and exclude interval growth. 4. Pulmonary artery enlargement suggests pulmonary arterial hypertension. 5. Cardiomegaly with coronary artery atherosclerosis. 6. Mediastinal adenopathy. Favored to be reactive. Recommend attention on follow-up. 7. Hepatic steatosis.   Electronically Signed   By: Abigail Miyamoto M.D.   On: 01/06/2014 18:56    Microbiology: Recent Results (from the past 240 hour(s))  RAPID STREP SCREEN     Status: Abnormal   Collection Time    01/06/14  7:20 PM      Result Value Ref Range Status   Streptococcus, Group A Screen (Direct) POSITIVE (*) NEGATIVE Final  MRSA PCR SCREENING      Status: None   Collection Time    01/06/14  7:46 PM      Result Value Ref Range Status   MRSA by PCR NEGATIVE  NEGATIVE Final   Comment:            The GeneXpert MRSA Assay (FDA     approved for NASAL specimens     only), is one component of a     comprehensive MRSA colonization     surveillance program. It is not     intended to diagnose MRSA     infection nor to guide or     monitor treatment for     MRSA infections.     Labs: Basic Metabolic Panel:  Recent Labs Lab 01/06/14 1617 01/07/14 0434 01/08/14 0528  NA 133* 138 140  K 4.0 3.6*  4.0  CL 94* 99 104  CO2 24 23 24   GLUCOSE 224* 315* 248*  BUN 12 15 20   CREATININE 1.01 0.93 0.84  CALCIUM 9.0 8.7 8.6   Liver Function Tests:  Recent Labs Lab 01/06/14 1617  AST 30  ALT 33  ALKPHOS 98  BILITOT 0.3  PROT 8.3  ALBUMIN 4.3   No results found for this basename: LIPASE, AMYLASE,  in the last 168 hours No results found for this basename: AMMONIA,  in the last 168 hours CBC:  Recent Labs Lab 01/06/14 1617 01/07/14 0434 01/08/14 0528  WBC 9.8 10.7* 22.1*  NEUTROABS 7.8*  --   --   HGB 15.5 15.6 14.0  HCT 45.0 45.9 40.9  MCV 89.1 90.5 90.3  PLT 235 259 247   Cardiac Enzymes:  Recent Labs Lab 01/06/14 1617  TROPONINI <0.30   BNP: BNP (last 3 results)  Recent Labs  01/06/14 1617  PROBNP 301.5*   CBG:  Recent Labs Lab 01/07/14 1147 01/07/14 1704 01/07/14 2115 01/08/14 0739 01/08/14 1155  GLUCAP 371* 234* 298* 250* 306*       Signed:  MEMON,JEHANZEB  [facility] 01/08/2014, 6:42 PM

## 2014-04-27 DIAGNOSIS — E785 Hyperlipidemia, unspecified: Secondary | ICD-10-CM | POA: Insufficient documentation

## 2014-08-18 DIAGNOSIS — E785 Hyperlipidemia, unspecified: Secondary | ICD-10-CM

## 2014-08-18 DIAGNOSIS — E1169 Type 2 diabetes mellitus with other specified complication: Secondary | ICD-10-CM | POA: Insufficient documentation

## 2014-12-14 DIAGNOSIS — I2699 Other pulmonary embolism without acute cor pulmonale: Secondary | ICD-10-CM

## 2014-12-14 HISTORY — DX: Other pulmonary embolism without acute cor pulmonale: I26.99

## 2015-01-21 DIAGNOSIS — T81718A Complication of other artery following a procedure, not elsewhere classified, initial encounter: Secondary | ICD-10-CM

## 2015-01-21 DIAGNOSIS — I2699 Other pulmonary embolism without acute cor pulmonale: Secondary | ICD-10-CM | POA: Insufficient documentation

## 2015-02-09 DIAGNOSIS — M1711 Unilateral primary osteoarthritis, right knee: Secondary | ICD-10-CM | POA: Insufficient documentation

## 2015-02-09 DIAGNOSIS — I2699 Other pulmonary embolism without acute cor pulmonale: Secondary | ICD-10-CM | POA: Insufficient documentation

## 2015-07-29 DIAGNOSIS — K219 Gastro-esophageal reflux disease without esophagitis: Secondary | ICD-10-CM | POA: Insufficient documentation

## 2016-04-25 ENCOUNTER — Encounter: Payer: Self-pay | Admitting: Neurology

## 2016-04-25 ENCOUNTER — Ambulatory Visit (INDEPENDENT_AMBULATORY_CARE_PROVIDER_SITE_OTHER): Payer: Medicare HMO | Admitting: Neurology

## 2016-04-25 VITALS — BP 145/90 | HR 72 | Ht 70.0 in | Wt 241.2 lb

## 2016-04-25 DIAGNOSIS — E538 Deficiency of other specified B group vitamins: Secondary | ICD-10-CM

## 2016-04-25 DIAGNOSIS — R4189 Other symptoms and signs involving cognitive functions and awareness: Secondary | ICD-10-CM | POA: Insufficient documentation

## 2016-04-25 DIAGNOSIS — R413 Other amnesia: Secondary | ICD-10-CM | POA: Diagnosis not present

## 2016-04-25 DIAGNOSIS — R5383 Other fatigue: Secondary | ICD-10-CM | POA: Diagnosis not present

## 2016-04-25 DIAGNOSIS — R0681 Apnea, not elsewhere classified: Secondary | ICD-10-CM | POA: Diagnosis not present

## 2016-04-25 DIAGNOSIS — R0683 Snoring: Secondary | ICD-10-CM | POA: Diagnosis not present

## 2016-04-25 MED ORDER — DONEPEZIL HCL 10 MG PO TABS: 10.0000 mg | ORAL_TABLET | Freq: Every day | ORAL | Status: DC

## 2016-04-25 NOTE — Progress Notes (Signed)
[facility]    Provider:  Dr Jaynee Eagles Referring Provider: [name] Primary Care Physician:  [name], DO  CC:  Speech difficulties  HPI:  Rodney Orozco is a 75 y.o. male here as a referral from Dr. Edrick Oh for altered mental status. Patient has a past medical history of diabetes, hypertension, mixed hyperlipidemia, smoker for 50 years 2 packs per day, vitamin D deficient osteomalacia, altered mental status. He had back surgery in 1992 and replacement in February 2016 in December 2016, B12 deficiency, likely untreated sleep apnea. He is feeling a lot better as far as his knees but his memory is worsened since. He has not been acting like himself since the surgery. Wife provides most information. He gets confused about things, he will try to tell you something and can't think of the words, his personality has changed. He is more agitated. He has always been more laid back. Patient says he does get agitated sometimes but unsure why. His memory is worsening. He forgets things he needs to do. Wife says they have 9 great grand children and he gets one confused as far the name goes. He loses track of conversations, forgets people he recently meets, he gets people confused as far as names go. Patient pays the bills and he needs help with the bank account and wife has to help with medication as well. No hallucinations. He does not want to go out and socialize. Patient feels his memory is worsening. Has not been himself. Wife feels like there is depression, he acts sad, but patient denies feeling sad. He feels frustrated because he can't remember things. His mother had dementia. She was 66 and had some memory changes also had a history of strokes. He has been on aspirin for one year before that never took aspirin. Now he takes '81mg'$  daily. He is on a medication for cholesterol. Reviewed MRI of the brain with patient and wife with multiple lacunar infarcts could be small vessel disease  due to vascular risk factors and long-term history of smoking. Will not change management unclear how old the infarcts are and he has only been on aspirin daily for the last year.  Reviewed notes, labs and imaging from outside physicians, which showed: I reviewed records from primary care. Patient has had some episodes of low a.m. blood sugars. Wife has voiced some problems of memory and irritability since his last surgery. Apparently he has not been back to normal or bit himself and surgery for his last total knee replacement, having more agitation, usually has no temper, is more snappy, whole personality change. Note stated he is acting funny. She has been quizzing him today about things he has been getting wrong or mixed up. Exam of cavity, throat, neck thyroid, skin, heart, lungs, abdomen, extremities and neurologic were normal.  Reviewed  MRI brain completed 04/11/2016. Personally reviewed images and agree with the following:  Remote lacunar infarcts are present within the caudate heads bilaterally in the anterior right lentiform nucleus. Dilated perivascular spaces are present within the basal ganglia. A remote medial left baclofen or infarct is present. A remote lacunar infarct is present in the right cerebellar white matter. Moderate periventricular diffuse subcortical white matter changes present bilaterally as well. White matter disease extends into the brainstem. The internal auditory canals are within normal limits bilaterally. Flow is present in the major intracranial arteries. The globes and orbits are intact. The paranasal sinuses are clear. There is some fluid in the mastoid air cells.  Impression was multiple and remote lacunar infarcts in the basal ganglia bilaterally. No acute or subacute abnormality. Bilateral mastoid effusions without obstructing nasopharyngeal lesion.  Review of Systems: Patient complains of symptoms per HPI as well as the following symptoms: No chest pain, no shortness  of breath. Pertinent negatives per HPI. All others negative.   Social History   Social History  . Marital Status: Married    Spouse Name: [name]  . Number of Children: 3  . Years of Education: 11   Occupational History  . Retired    Social History Main Topics  . Smoking status: Former Smoker -- 1.00 packs/day for 55 years    Types: Cigarettes    Quit date: 11/07/2013  . Smokeless tobacco: Not on file  . Alcohol Use: No  . Drug Use: No  . Sexual Activity: Not on file   Other Topics Concern  . Not on file   Social History Narrative   Lives with wife   Caffeine use: 4 cups /day (coffee)    Family History  Problem Relation Age of Onset  . Dementia Mother     Past Medical History  Diagnosis Date  . Diabetes mellitus without complication ([place])   . Pneumonia December 2014    Had hemoptysis and admitted at [facility].  . Hypertension   . Dyslipidemia     Past Surgical History  Procedure Laterality Date  . Knee surgery  Feb 2016/Dec 2016  . Back surgery  1992  . Abdominal surgery  1999    Current Outpatient Prescriptions  Medication Sig Dispense Refill  . amLODipine (NORVASC) 10 MG tablet Take 10 mg by mouth daily.    Marland Kitchen aspirin EC 81 MG tablet Take 81 mg by mouth daily.    Marland Kitchen losartan (COZAAR) 100 MG tablet Take 100 mg by mouth daily.    . metFORMIN (GLUCOPHAGE-XR) 500 MG 24 hr tablet Take 1,000 mg by mouth 2 (two) times daily.    Marland Kitchen NOVOLIN N RELION 100 UNIT/ML injection Inject 10 Units into the skin 2 (two) times daily.    Marland Kitchen VICTOZA 18 MG/3ML SOPN Inject 1.2 mg into the skin daily.    Marland Kitchen donepezil (ARICEPT) 10 MG tablet Take 1 tablet (10 mg total) by mouth at bedtime. 30 tablet 12   No current facility-administered medications for this visit.    Allergies as of 04/25/2016 - Review Complete 04/25/2016  Allergen Reaction Noted  . Codeine Other (See Comments) 04/25/2016  . Tramadol Dermatitis 04/25/2016  . Lipitor [atorvastatin]  01/06/2014  .  Penicillins  01/06/2014    Vitals: BP 145/90 mmHg  Pulse 72  Ht '5\' 10"'$  (1.778 m)  Wt 241 lb 3.2 oz (109.408 kg)  BMI 34.61 kg/m2  SpO2 93% Last Weight:  Wt Readings from Last 1 Encounters:  04/25/16 241 lb 3.2 oz (109.408 kg)   Last Height:   Ht Readings from Last 1 Encounters:  04/25/16 '5\' 10"'$  (1.778 m)   Physical exam: Exam: Gen: NAD, conversant, well nourised, obese, well groomed                     CV: RRR, no MRG. No Carotid Bruits. No peripheral edema, warm, nontender Eyes: Conjunctivae clear without exudates or hemorrhage  Neuro: Detailed Neurologic Exam  Speech:    Speech is normal; fluent and spontaneous with normal comprehension.  Cognition:    The patient is oriented to person, place, and time;     recent and remote memory intact;  language fluent;     normal attention, concentration,     fund of knowledge  MMSE - Mini Mental State Exam 04/25/2016  Orientation to time 5  Orientation to Place 4  Registration 3  Attention/ Calculation 5  Recall 0  Language- name 2 objects 2  Language- repeat 1  Language- follow 3 step command 3  Language- read & follow direction 1  Write a sentence 1  Copy design 1  Total score 26   Cranial Nerves:    The pupils are equal, round, and reactive to light. Attempted funduscopic exam could not visualize due to small pupils. Visual fields are full to finger confrontation. Extraocular movements are intact. Trigeminal sensation is intact and the muscles of mastication are normal. The face is symmetric. The palate elevates in the midline. Hearing intact. Voice is normal. Shoulder shrug is normal. The tongue has normal motion without fasciculations.   Coordination:    Normal finger to nose and heel to shin. Normal rapid alternating movements.   Gait:    Heel-toe and tandem gait are normal.   Motor Observation:    No asymmetry, no atrophy, and no involuntary movements noted. Tone:    Normal muscle tone.    Posture:     Posture is normal. normal erect    Strength:    Strength is V/V in the upper and lower limbs.      Sensation: intact to LT     Reflex Exam:  DTR's: Absent lowers (knees surgical) otherwise deep tendon reflexes in the upper extremities are normal bilaterally.   Toes:    The toes are downgoing bilaterally.   Clonus:    Clonus is absent.       Assessment/Plan:  75 y.o. male here as a referral from Dr. Edrick Oh for altered mental status. Patient has a past medical history of diabetes, hypertension, mixed hyperlipidemia, smoker for 50 years 2 packs per day, vitamin D deficient osteomalacia, altered mental status, multiple remote lacunar infarcts likely due to small vessel disease secondary to multiple vascular risk factors. He had back surgery in 1992 and replacement in February 2016 in December 2016, B12 deficiency, likely untreated sleep apnea. He is feeling a lot better as far as his knees but his memory is worsened since.Neuro exam is non focal.   - Labs today: B12, tsh, rpr - Snoring, memory loss, witnessed apneic events, daytime somnolence, stroke: Needs a sleep eval - Memory test today MMSE 26/30 - Aricept '10mg'$ : start with 1/2 a pill then increase in 2 weeks if tolerating - Long discussion, it sounds like patient has significant sleep apnea. He needs a sleep evaluation. Untreated obstructive sleep apnea can cause multiple medical problems including perceived memory loss, increased risk of stroke, hypertension, obesity, cardiac and pulmonary problems and others. Patient needs to have this evaluated and treated before we can adequately assess his memory. - Follow up in 6 months in the meantime will refer to sleep studies, can better assess his memory when his sleep apnea is addressed. Also discussed possible neurocognitive testing if they would like.    CC: Dr. Jac Canavan, MD  [facility] [address] [place], [place] [address]  Phone  [phone] Fax [phone]

## 2016-04-25 NOTE — Patient Instructions (Signed)
Remember to drink plenty of fluid, eat healthy meals and do not skip any meals. Try to eat protein with a every meal and eat a healthy snack such as fruit or nuts in between meals. Try to keep a regular sleep-wake schedule and try to exercise daily, particularly in the form of walking, 20-30 minutes a day, if you can.   As far as your medications are concerned, I would like to suggest: Aricept(Donepezil), start with 1/2 tab and increase to a whol etablet in 2 weeks.  As far as diagnostic testing: Labs, sleep evaluation  I would like to see you back in 6 months, sooner if we need to. Please call us with any interim questions, concerns, problems, updates or refill requests.   Our phone number is [phone]. We also have an after hours call service for urgent matters and there is a physician on-call for urgent questions. For any emergencies you know to call 911 or go to the nearest emergency room

## 2016-04-26 ENCOUNTER — Telehealth: Payer: Self-pay | Admitting: *Deleted

## 2016-04-26 LAB — TSH: TSH: 0.772 u[IU]/mL (ref 0.450–4.500)

## 2016-04-26 LAB — RPR: RPR: NONREACTIVE

## 2016-04-26 LAB — B12 AND FOLATE PANEL
Folate: 15.1 ng/mL (ref >3.0)
VITAMIN B 12: 793 pg/mL (ref 211–946)

## 2016-04-26 NOTE — Telephone Encounter (Signed)
-----   Message from Melvenia Beam, MD sent at 04/26/2016  1:13 PM EDT ----- Labs normal thanks

## 2016-04-26 NOTE — Telephone Encounter (Signed)
Called and spoke to pt wife about normal labs per Dr Jaynee Eagles. She verbalized understanding.

## 2016-05-02 ENCOUNTER — Telehealth: Payer: Self-pay

## 2016-05-02 NOTE — Telephone Encounter (Signed)
Dr. Rexene Alberts is ooo on Monday, spoke to wife and moved appt to 7/3.

## 2016-05-08 ENCOUNTER — Institutional Professional Consult (permissible substitution): Payer: Medicare HMO | Admitting: Neurology

## 2016-05-15 ENCOUNTER — Ambulatory Visit (INDEPENDENT_AMBULATORY_CARE_PROVIDER_SITE_OTHER): Payer: Medicare HMO | Admitting: Neurology

## 2016-05-15 ENCOUNTER — Encounter: Payer: Self-pay | Admitting: Neurology

## 2016-05-15 VITALS — BP 172/88 | HR 78 | Resp 18 | Ht 70.0 in | Wt 236.0 lb

## 2016-05-15 DIAGNOSIS — G4710 Hypersomnia, unspecified: Secondary | ICD-10-CM | POA: Diagnosis not present

## 2016-05-15 DIAGNOSIS — R51 Headache: Secondary | ICD-10-CM

## 2016-05-15 DIAGNOSIS — R351 Nocturia: Secondary | ICD-10-CM | POA: Diagnosis not present

## 2016-05-15 DIAGNOSIS — G4733 Obstructive sleep apnea (adult) (pediatric): Secondary | ICD-10-CM | POA: Diagnosis not present

## 2016-05-15 DIAGNOSIS — R413 Other amnesia: Secondary | ICD-10-CM | POA: Diagnosis not present

## 2016-05-15 DIAGNOSIS — R519 Headache, unspecified: Secondary | ICD-10-CM

## 2016-05-15 NOTE — Progress Notes (Signed)
Subjective:    Patient ID: Rodney Orozco is a 75 y.o. male.  HPI     Star Age, MD, PhD [facility] [address]O. [place], [place] [address]  Dear Berta Minor,   I saw your patient, Rodney Orozco, upon your kind request in my clinic today for initial consultation of his sleep disorder, in particular, concern for underlying obstructive sleep apnea. The patient is accompanied by his wife today. As you know, Rodney Orozco is a 75 year old right-handed gentleman with an underlying medical history of diabetes, history of pneumonia, hypertension, hyperlipidemia, arthritis, status post knee surgeries (TKA in Feb. and Dec. 2016), s/p low back surgery, status post remote abdominal surgery, remote history of smoking but previously heavy smoking history (quit about 4 years ago), and obesity, who reports snoring and excessive daytime somnolence. I reviewed your office note from 04/25/2016. Patient has a history of memory loss. He also has a history of B12 deficiency. You have started him on Aricept recently. His Epworth sleepiness score is 16 out of 24 today, his fatigue score is 44 out of 63. He goes to bed around 10 PM and falls asleep fairly quickly. Wake up time is around 4:30 to 5 AM. They are early risers. He does get up to use the bathroom about 3-4 times on an average night. He has fairly frequent morning headaches and often takes [place] powder every day. He does not have any restless leg symptoms and is not known to twitch his legs or cake and his sleep according to the wife. She has witnessed breathing pauses while he is asleep. He takes a nap usually once in the afternoon after lunch and sleeps for about an hour. He has a family history of obstructive sleep apnea in 3 sisters who all have CPAP machines. He would be willing to try CPAP himself. He does not watch TV at night. He quit smoking about 4 years ago. He is retired and lives with his wife. They have grown  children, one next door. He has been tolerating Aricept at 10 mg once daily.  His Past Medical History Is Significant For: Past Medical History  Diagnosis Date  . Diabetes mellitus without complication ([place])   . Pneumonia December 2014    Had hemoptysis and admitted at [facility].  . Hypertension   . Dyslipidemia     His Past Surgical History Is Significant For: Past Surgical History  Procedure Laterality Date  . Knee surgery  Feb 2016/Dec 2016  . Back surgery  1992  . Abdominal surgery  1999    His Family History Is Significant For: Family History  Problem Relation Age of Onset  . Dementia Mother   . Stroke Mother   . Heart attack Father     His Social History Is Significant For: Social History   Social History  . Marital Status: Married    Spouse Name: [name]  . Number of Children: 3  . Years of Education: 11   Occupational History  . Retired    Social History Main Topics  . Smoking status: Former Smoker -- 1.00 packs/day for 55 years    Types: Cigarettes    Quit date: 11/07/2013  . Smokeless tobacco: None  . Alcohol Use: No  . Drug Use: No  . Sexual Activity: Not Asked   Other Topics Concern  . None   Social History Narrative   Lives with wife   Caffeine use: 4 cups /day (coffee)  His Allergies Are:  Allergies  Allergen Reactions  . Codeine Other (See Comments)    Hallucinations  . Tramadol Dermatitis  . Lipitor [Atorvastatin]     Joint pain  . Penicillins     HIves/rash  :   His Current Medications Are:  Outpatient Encounter Prescriptions as of 05/15/2016  Medication Sig  . amLODipine (NORVASC) 10 MG tablet Take 10 mg by mouth daily.  Marland Kitchen aspirin EC 81 MG tablet Take 81 mg by mouth daily.  Marland Kitchen donepezil (ARICEPT) 10 MG tablet Take 1 tablet (10 mg total) by mouth at bedtime.  Marland Kitchen losartan (COZAAR) 100 MG tablet Take 100 mg by mouth daily.  . metFORMIN (GLUCOPHAGE-XR) 500 MG 24 hr tablet Take 1,000 mg by mouth 2 (two) times daily.  Marland Kitchen  NOVOLIN N RELION 100 UNIT/ML injection Inject 10 Units into the skin 2 (two) times daily.  Marland Kitchen VICTOZA 18 MG/3ML SOPN Inject 1.2 mg into the skin daily.   No facility-administered encounter medications on file as of 05/15/2016.  :  Review of Systems:  Out of a complete 14 point review of systems, all are reviewed and negative with the exception of these symptoms as listed below:   Review of Systems  Neurological:       Patient has trouble staying asleep, snoring, witnessed apnea, wakes up feeling tired, morning headaches, daytime tiredness, takes naps.   Psychiatric/Behavioral: Positive for confusion.  Epworth Sleepiness Scale 0= would never doze 1= slight chance of dozing 2= moderate chance of dozing 3= high chance of dozing  Sitting and reading:3 Watching TV:3 Sitting inactive in a public place (ex. Theater or meeting):2 As a passenger in a car for an hour without a break: 1 Lying down to rest in the afternoon:3 Sitting and talking to someone:1 Sitting quietly after lunch (no alcohol):3 In a car, while stopped in traffic:0 Total:16   Objective:  Neurologic Exam  Physical Exam Physical Examination:   Filed Vitals:   05/15/16 1115  BP: 172/88  Pulse: 78  Resp: 18   General Examination: The patient is a very pleasant 75 y.o. male in no acute distress. He appears well-developed and well-nourished and well groomed.   HEENT: Normocephalic, atraumatic, pupils are equal, round and reactive to light and accommodation. Extraocular tracking is good without limitation to gaze excursion or nystagmus noted. Normal smooth pursuit is noted. Hearing is mildly impaired with bilateral hearing aids in place. Face is symmetric with normal facial animation and normal facial sensation. Speech is clear with no dysarthria noted. There is no hypophonia. There is no lip, neck/head, jaw or voice tremor. Neck is supple with full range of passive and active motion. There are no carotid bruits on  auscultation. Oropharynx exam reveals: mild mouth dryness, adequate dental hygiene and moderate airway crowding, due to wider tongue, tonsils in place, redundant soft palate with elongated uvula. Mallampati is class II. Tongue protrudes centrally and palate elevates symmetrically. Tonsils are small in size. Neck size is 17.5 inches.   Chest: Clear to auscultation without wheezing, rhonchi or crackles noted.  Heart: S1+S2+0, regular and normal without murmurs, rubs or gallops noted.   Abdomen: Soft, non-tender and non-distended with normal bowel sounds appreciated on auscultation.  Extremities: There is no pitting edema in the distal lower extremities bilaterally. Pedal pulses are intact.  Skin: Warm and dry without trophic changes noted. There are no varicose veins.  Musculoskeletal: exam reveals no obvious joint deformities, tenderness or joint swelling or erythema. Status post low back surgery  and bilateral knee replacement surgeries.   Neurologically:  Mental status: The patient is awake, alert and oriented in all 4 spheres. His immediate and remote memory, attention, language skills and fund of knowledge are mildly impaired. There is no evidence of aphasia, agnosia, apraxia or anomia. Speech is clear with normal prosody and enunciation. Thought process is linear. Mood is normal and affect is normal.  Cranial nerves II - XII are as described above under HEENT exam. In addition: shoulder shrug is normal with equal shoulder height noted. Motor exam: Normal bulk, strength and tone is noted. There is no drift, tremor or rebound. Romberg is negative. Reflexes are 1+ in the upper extremities and absent in the lower extremities. Fine motor skills and coordination: intact with normal finger taps, normal hand movements, normal rapid alternating patting, normal foot taps and normal foot agility.  Cerebellar testing: No dysmetria or intention tremor on finger to nose testing. Heel to shin is unremarkable  bilaterally. There is no truncal or gait ataxia.  Sensory exam: intact to light touch in the upper and lower extremities.  Gait, station and balance: He stands with mild difficulty.  posture is age-appropriate, he stands very mildly wide-based. He has no limp, but tandem walk is not possible.   Assessment and Plan:   In summary, Rodney Orozco is a very pleasant 75 y.o.-year old male with an underlying medical history of diabetes, history of pneumonia, hypertension, hyperlipidemia, arthritis, status post knee surgeries (TKA in Feb. and Dec. 2016), s/p low back surgery, status post remote abdominal surgery, remote history of smoking but previously heavy smoking history (quit about 4 years ago), and obesity, whose history and physical exam are in keeping with obstructive sleep apnea (OSA). I had a long chat with the patient and his wife about my findings and the diagnosis of OSA, its prognosis and treatment options. We talked about medical treatments, surgical interventions and non-pharmacological approaches. I explained in particular the risks and ramifications of untreated moderate to severe OSA, especially with respect to developing cardiovascular disease down the Road, including congestive heart failure, difficult to treat hypertension, cardiac arrhythmias, or stroke. Even type 2 diabetes has, in part, been linked to untreated OSA. Symptoms of untreated OSA include daytime sleepiness, memory problems, mood irritability and mood disorder such as depression and anxiety, lack of energy, as well as recurrent headaches, especially morning headaches. We talked about trying to maintain a healthy lifestyle in general, as well as the importance of weight control. I encouraged the patient to eat healthy, exercise daily and keep well hydrated, to keep a scheduled bedtime and wake time routine, to not skip any meals and eat healthy snacks in between meals. I advised the patient not to drive when feeling sleepy. Of  note, he does not drive. His wife will drop him and pick him up for the sleep study. I recommended the following at this time: sleep study with potential positive airway pressure titration. (We will score hypopneas at 4% and split the sleep study into diagnostic and treatment portion, if the estimated. 2 hour AHI is >15/h).   I explained the sleep test procedure to the patient and also outlined possible surgical and non-surgical treatment options of OSA, including the use of a custom-made dental device (which would require a referral to a specialist dentist or oral surgeon), upper airway surgical options, such as pillar implants, radiofrequency surgery, tongue base surgery, and UPPP (which would involve a referral to an ENT surgeon). Rarely, jaw surgery such  as mandibular advancement may be considered.  I also explained the CPAP treatment option to the patient, who indicated that he would be willing to try CPAP if the need arises. I explained the importance of being compliant with PAP treatment, not only for insurance purposes but primarily to improve His symptoms, and for the patient's long term health benefit, including to reduce His cardiovascular risks. I answered all their questions today and the patient and his wife were in agreement. I would like to see him back after the sleep study is completed and encouraged him to call with any interim questions, concerns, problems or updates.   Thank you very much for allowing me to participate in the care of this nice patient. If I can be of any further assistance to you please do not hesitate to talk to me.  Sincerely,   Star Age, MD, PhD

## 2016-05-15 NOTE — Patient Instructions (Signed)

## 2016-05-30 ENCOUNTER — Ambulatory Visit (INDEPENDENT_AMBULATORY_CARE_PROVIDER_SITE_OTHER): Payer: Medicare HMO | Admitting: Neurology

## 2016-05-30 DIAGNOSIS — G4731 Primary central sleep apnea: Secondary | ICD-10-CM

## 2016-05-30 DIAGNOSIS — G479 Sleep disorder, unspecified: Secondary | ICD-10-CM

## 2016-05-30 DIAGNOSIS — G4733 Obstructive sleep apnea (adult) (pediatric): Secondary | ICD-10-CM | POA: Diagnosis not present

## 2016-05-30 DIAGNOSIS — G4734 Idiopathic sleep related nonobstructive alveolar hypoventilation: Secondary | ICD-10-CM

## 2016-05-30 DIAGNOSIS — R9431 Abnormal electrocardiogram [ECG] [EKG]: Secondary | ICD-10-CM

## 2016-06-05 ENCOUNTER — Telehealth: Payer: Self-pay | Admitting: Neurology

## 2016-06-05 DIAGNOSIS — G4733 Obstructive sleep apnea (adult) (pediatric): Secondary | ICD-10-CM

## 2016-06-05 DIAGNOSIS — G4734 Idiopathic sleep related nonobstructive alveolar hypoventilation: Secondary | ICD-10-CM

## 2016-06-05 DIAGNOSIS — G4731 Primary central sleep apnea: Secondary | ICD-10-CM

## 2016-06-05 DIAGNOSIS — Z9989 Dependence on other enabling machines and devices: Secondary | ICD-10-CM

## 2016-06-05 NOTE — Telephone Encounter (Signed)
Patient referred by Dr. Jaynee Eagles, seen by me on 05/15/16, split night sleep study on 05/30/16, ins: [employer] MCR.   Please call and notify the patient that the recent sleep study did confirm the diagnosis of severe obstructive sleep apnea but CPAP did not help and he was tried on BiPAP and oxygen due to persistent low O2.  I will prescribe BiPAP and O2 for now, but we need to bring him back for another sleep study to find the right treatment modality, and O2 level. This will require a repeat sleep study for proper titration and mask fitting and O2 titration. Please explain to patient and arrange for a CPAP titration study. I have placed an order in the chart. Thanks, and please route to [facility] for scheduling next sleep study.  Star Age, MD, PhD [facility])

## 2016-06-05 NOTE — Telephone Encounter (Signed)
I spoke to husband and wife. They are aware of results and are willing to proceed with titration study. They will be out of town 8/5-8/10.   I will send BiPAP orders to [facility]. I will send report to PCP. Patient will get a letter reminding him to make f/u appt and stress the importance of compliance.

## 2016-06-16 ENCOUNTER — Ambulatory Visit (INDEPENDENT_AMBULATORY_CARE_PROVIDER_SITE_OTHER): Payer: Medicare HMO | Admitting: Neurology

## 2016-06-16 DIAGNOSIS — G4733 Obstructive sleep apnea (adult) (pediatric): Secondary | ICD-10-CM | POA: Diagnosis not present

## 2016-06-16 DIAGNOSIS — R9431 Abnormal electrocardiogram [ECG] [EKG]: Secondary | ICD-10-CM

## 2016-06-16 DIAGNOSIS — G4731 Primary central sleep apnea: Secondary | ICD-10-CM

## 2016-06-16 DIAGNOSIS — G479 Sleep disorder, unspecified: Secondary | ICD-10-CM

## 2016-06-16 DIAGNOSIS — G4734 Idiopathic sleep related nonobstructive alveolar hypoventilation: Secondary | ICD-10-CM

## 2016-06-21 ENCOUNTER — Telehealth: Payer: Self-pay | Admitting: Neurology

## 2016-06-21 DIAGNOSIS — Z9989 Dependence on other enabling machines and devices: Secondary | ICD-10-CM

## 2016-06-21 DIAGNOSIS — G4734 Idiopathic sleep related nonobstructive alveolar hypoventilation: Secondary | ICD-10-CM

## 2016-06-21 DIAGNOSIS — G4731 Primary central sleep apnea: Secondary | ICD-10-CM

## 2016-06-21 NOTE — Telephone Encounter (Signed)
LM for patient to call back for results

## 2016-06-21 NOTE — Telephone Encounter (Signed)
Patient referred by Dr. Jaynee Eagles, seen by me on 05/15/16, split night sleep study on 05/30/16, pap study 06/16/16, ins: [employer] MCR.  Please inform patient, that he did not do well enough during this last study, we tried BiPAP and ASV, a more sophisticated BiPAP machine, he sleep very LITTLE, poor sleep consolidation, no dream sleep.  I have previously prescribed BiPAP and O2, he should continue with that and pls make a FU app in 6 weeks. In the interim, I would like for him to see an lung specialist to find out, why his O2 levels were low - during wake and asleep.  Referral placed, pls facilitate and make FU appt, ensure patient is indeed on home BiPAP and O2?

## 2016-06-28 NOTE — Telephone Encounter (Signed)
I spoke to patient and he is aware of results and recommendations. He is ok with referral to Pulmonology. Looks like PCP has also placed a referral for Pulmonology. I will send report to PCP.

## 2016-06-29 ENCOUNTER — Telehealth: Payer: Self-pay | Admitting: Cardiovascular Disease

## 2016-06-29 NOTE — Telephone Encounter (Signed)
Received records from [facility] for appointment on 07/12/16 with Dr Gwenlyn Found.  Records given to [facility] (medical records) for Dr Kennon Holter schedule on 07/12/16. lp

## 2016-07-12 ENCOUNTER — Encounter: Payer: Self-pay | Admitting: Cardiovascular Disease

## 2016-07-12 ENCOUNTER — Encounter (INDEPENDENT_AMBULATORY_CARE_PROVIDER_SITE_OTHER): Payer: Self-pay

## 2016-07-12 ENCOUNTER — Ambulatory Visit (INDEPENDENT_AMBULATORY_CARE_PROVIDER_SITE_OTHER): Payer: Medicare HMO | Admitting: Cardiovascular Disease

## 2016-07-12 VITALS — BP 128/70 | HR 90 | Ht 70.5 in | Wt 228.0 lb

## 2016-07-12 DIAGNOSIS — I714 Abdominal aortic aneurysm, without rupture, unspecified: Secondary | ICD-10-CM | POA: Insufficient documentation

## 2016-07-12 DIAGNOSIS — Z01818 Encounter for other preprocedural examination: Secondary | ICD-10-CM

## 2016-07-12 DIAGNOSIS — I5041 Acute combined systolic (congestive) and diastolic (congestive) heart failure: Secondary | ICD-10-CM | POA: Insufficient documentation

## 2016-07-12 DIAGNOSIS — I480 Paroxysmal atrial fibrillation: Secondary | ICD-10-CM | POA: Diagnosis not present

## 2016-07-12 DIAGNOSIS — I519 Heart disease, unspecified: Secondary | ICD-10-CM | POA: Insufficient documentation

## 2016-07-12 MED ORDER — DILTIAZEM HCL ER COATED BEADS 180 MG PO CP24: 180.0000 mg | ORAL_CAPSULE | Freq: Every day | ORAL | 3 refills | Status: DC

## 2016-07-12 NOTE — Assessment & Plan Note (Signed)
History of PAF during recent hospitalization at [facility] during an  episode of COPD exacerbation. He converted to normal sinus rhythm and was placed on Coumadin anticoagulation.

## 2016-07-12 NOTE — Assessment & Plan Note (Signed)
History of hyperlipidemia intolerant to statin therapy

## 2016-07-12 NOTE — Patient Instructions (Addendum)
Medication Instructions:  Your physician has recommended you make the following change in your medication:  1- STOP TAKING YOUR Amlodipine. 2- INCREASE Cartia to 180 mg (1 tablet) by mouth once a day.   Testing/Procedures: Your physician has requested that you have a lexiscan myoview. For further information please visit [email or web address]. Please follow instruction sheet, as given.  Your physician has requested that you have an abdominal aorta duplex. During this test, an ultrasound is used to evaluate the aorta. Allow 30 minutes for this exam. Do not eat after midnight the day before and avoid carbonated beverages   Follow-Up: You have been referred to [facility] FOR EVALUATION.   Your physician recommends that you schedule a follow-up appointment in: [place].    Any Other Special Instructions Will Be Listed Below (If Applicable).  Pharmacologic Stress Electrocardiogram A pharmacologic stress electrocardiogram is a heart (cardiac) test that uses nuclear imaging to evaluate the blood supply to your heart. This test may also be called a pharmacologic stress electrocardiography. Pharmacologic means that a medicine is used to increase your heart rate and blood pressure.  This stress test is done to find areas of poor blood flow to the heart by determining the extent of coronary artery disease (CAD). Some people exercise on a treadmill, which naturally increases the blood flow to the heart. For those people unable to exercise on a treadmill, a medicine is used. This medicine stimulates your heart and will cause your heart to beat harder and more quickly, as if you were exercising.  Pharmacologic stress tests can help determine:  The adequacy of blood flow to your heart during increased levels of activity in order to clear you for discharge home.  The extent of coronary artery blockage caused by CAD.  Your prognosis if you have suffered a heart attack.  The effectiveness  of cardiac procedures done, such as an angioplasty, which can increase the circulation in your coronary arteries.  Causes of chest pain or pressure. LET [facility] CARE PROVIDER KNOW ABOUT:  Any allergies you have.  All medicines you are taking, including vitamins, herbs, eye drops, creams, and over-the-counter medicines.  Previous problems you or members of your family have had with the use of anesthetics.  Any blood disorders you have.  Previous surgeries you have had.  Medical conditions you have.  Possibility of pregnancy, if this applies.  If you are currently breastfeeding. RISKS AND COMPLICATIONS Generally, this is a safe procedure. However, as with any procedure, complications can occur. Possible complications include:  You develop pain or pressure in the following areas:  Chest.  Jaw or neck.  Between your shoulder blades.  Radiating down your left arm.  Headache.  Dizziness or light-headedness.  Shortness of breath.  Increased or irregular heartbeat.  Low blood pressure.  Nausea or vomiting.  Flushing.  Redness going up the arm and slight pain during injection of medicine.  Heart attack (rare). BEFORE THE PROCEDURE   Avoid all forms of caffeine for 24 hours before your test or as directed by your health care provider. This includes coffee, tea (even decaffeinated tea), caffeinated sodas, chocolate, cocoa, and certain pain medicines.  Follow your health care provider's instructions regarding eating and drinking before the test.  Take your medicines as directed at regular times with water unless instructed otherwise. Exceptions may include:  If you have diabetes, ask how you are to take your insulin or pills. It is common to adjust insulin dosing the morning of  the test.  If you are taking beta-blocker medicines, it is important to talk to your health care provider about these medicines well before the date of your test. Taking beta-blocker  medicines may interfere with the test. In some cases, these medicines need to be changed or stopped 24 hours or more before the test.  If you wear a nitroglycerin patch, it may need to be removed prior to the test. Ask your health care provider if the patch should be removed before the test.  If you use an inhaler for any breathing condition, bring it with you to the test.  If you are an outpatient, bring a snack so you can eat right after the stress phase of the test.  Do not smoke for 4 hours prior to the test or as directed by your health care provider.  Do not apply lotions, powders, creams, or oils on your chest prior to the test.  Wear comfortable shoes and clothing. Let your health care provider know if you were unable to complete or follow the preparations for your test. PROCEDURE   Multiple patches (electrodes) will be put on your chest. If needed, small areas of your chest may be shaved to get better contact with the electrodes. Once the electrodes are attached to your body, multiple wires will be attached to the electrodes, and your heart rate will be monitored.  An IV access will be started. A nuclear trace (isotope) is given. The isotope may be given intravenously, or it may be swallowed. Nuclear refers to several types of radioactive isotopes, and the nuclear isotope lights up the arteries so that the nuclear images are clear. The isotope is absorbed by your body. This results in low radiation exposure.  A resting nuclear image is taken to show how your heart functions at rest.  A medicine is given through the IV access.  A second scan is done about 1 hour after the medicine injection and determines how your heart functions under stress.  During this stress phase, you will be connected to an electrocardiogram machine. Your blood pressure and oxygen levels will be monitored. AFTER THE PROCEDURE   Your heart rate and blood pressure will be monitored after the test.  You may  return to your normal schedule, including diet,activities, and medicines, unless your health care provider tells you otherwise.   This information is not intended to replace advice given to you by your health care provider. Make sure you discuss any questions you have with your health care provider.   Document Released: 03/18/2009 Document Revised: 11/04/2013 Document Reviewed: 07/07/2013 Elsevier Interactive Patient Education [employer].     If you need a refill on your cardiac medications before your next appointment, please call your pharmacy.

## 2016-07-12 NOTE — Assessment & Plan Note (Signed)
History of abdominal aortic aneurysm recently measured at 5.7 cm by CT scan. We will remeasure by abdominal ultrasound. He probably will need this revascularized would be high-risk for an open procedure. Hopefully he can be done percutaneously with endoluminal stent graft. I'm going to get a pharmacologic Myoview stress test to risk stratify him and refer him to Dr. Trula Slade for surgical evaluation.

## 2016-07-12 NOTE — Assessment & Plan Note (Signed)
2-D echocardiogram at [facility] during recent hospitalization earlier this month revealed an ejection fraction of 40-45% with mild to moderate RV dysfunction.

## 2016-07-12 NOTE — Assessment & Plan Note (Signed)
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

## 2016-07-12 NOTE — Assessment & Plan Note (Signed)
The patient was hospitalized earlier this month with COPD exacerbation. 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 2-D echo revealed an EF of 45%. He remains on furosemide 40 mg by mouth twice a day and is aware of some restriction.

## 2016-07-12 NOTE — Progress Notes (Signed)
07/12/2016 Rodney Orozco   03-06-1941  [id number]  Primary Physician Rodney Graves, DO Primary Cardiologist: Rodney Harp MD Rodney Orozco  HPI:  Rodney Orozco is a 75 year old mildly overweight married Caucasian male father of 28, grandfather benign great-grandchildren was accompanied by his wife Rodney Orozco. He was referred by his PCP, Dr. Octavio Orozco, for cardiovascular evaluation. He has greater than 100-pack-year history of tobacco abuse having smoked 2 packs a day for 50 years and stopped 5 years ago. In addition, history of hypertension, diabetes and hyperlipidemia intolerant to statin therapy. He does have a family history of heart disease with a brother who died of a myocardial infarction at 73 years old although he has never had a heart attack. He has had TIAs in the past. He complains of chronic shortness of breath. He does have a 5.7 cm abdominal aortic aneurysm recently measured by abdominal CT scan earlier this month. He has a remote pulmonary and was in February of last year after a right total knee replacement. He was on Coumadin anticoagulation for 6 months after that. He was recently hospitalized in [facility] for COPD exacerbation with concomitant acute systolic and diastolic heart failure. He was in the ICU for 5 days. He was diuresed and his COPD exacerbation was apparently treated. He was in A. fib with RVR and converted to normal sinus rhythm. He was placed on Coumadin anticoagulation. A 2-D echo revealed an ejection fraction of 40-45% with moderate RV dysfunction.   Current Outpatient Prescriptions  Medication Sig Dispense Refill  . albuterol (PROVENTIL) (2.5 MG/3ML) 0.083% nebulizer solution Inhale 3 mLs into the lungs as needed for shortness of breath.    Rodney Orozco XT 120 MG 24 hr capsule Take 1 capsule by mouth daily.    Marland Kitchen donepezil (ARICEPT) 10 MG tablet Take 1 tablet (10 mg total) by mouth at bedtime. 30 tablet 12  .  famotidine (PEPCID) 20 MG tablet Take 1 tablet by mouth 2 (two) times daily.    . furosemide (LASIX) 40 MG tablet Take 1 tablet by mouth 2 (two) times daily.    Marland Kitchen losartan (COZAAR) 100 MG tablet Take 100 mg by mouth daily.    . metFORMIN (GLUCOPHAGE-XR) 500 MG 24 hr tablet Take 1,000 mg by mouth 2 (two) times daily.    . metoprolol tartrate (LOPRESSOR) 25 MG tablet Take 1 tablet by mouth 2 (two) times daily.    Marland Kitchen NOVOLIN N RELION 100 UNIT/ML injection Inject 35 Units into the skin 2 (two) times daily.     Marland Kitchen VICTOZA 18 MG/3ML SOPN Inject 1.2 mg into the skin daily.    Marland Kitchen warfarin (COUMADIN) 5 MG tablet Take 1.5 tablets by mouth daily. For 14 days. Being checked again 07/13/16     No current facility-administered medications for this visit.     Allergies  Allergen Reactions  . Codeine Other (See Comments)    Hallucinations  . Tramadol Dermatitis  . Lipitor [Atorvastatin] Other (See Comments)    Joint pain  . Penicillins Rash    HIves/rash    Social History   Social History  . Marital status: Married    Spouse name: Rodney Orozco  . Number of children: 3  . Years of education: 71   Occupational History  . Retired    Social History Main Topics  . Smoking status: Former Smoker    Packs/day: 1.00    Years: 55.00    Types: Cigarettes  Quit date: 11/07/2013  . Smokeless tobacco: Never Used  . Alcohol use No  . Drug use: No  . Sexual activity: Not on file   Other Topics Concern  . Not on file   Social History Narrative   Lives with wife   Caffeine use: 4 cups /day (coffee)     Review of Systems: General: negative for chills, fever, night sweats or weight changes.  Cardiovascular: negative for chest pain, dyspnea on exertion, edema, orthopnea, palpitations, paroxysmal nocturnal dyspnea or shortness of breath Dermatological: negative for rash Respiratory: negative for cough or wheezing Urologic: negative for hematuria Abdominal: negative for nausea, vomiting, diarrhea,  bright red blood per rectum, melena, or hematemesis Neurologic: negative for visual changes, syncope, or dizziness All other systems reviewed and are otherwise negative except as noted above.    Blood pressure 128/70, pulse 90, height 5' 10.5" (1.791 m), weight 228 lb (103.4 kg).  General appearance: alert and no distress Neck: no adenopathy, no carotid bruit, no JVD, supple, symmetrical, trachea midline and thyroid not enlarged, symmetric, no tenderness/mass/nodules Lungs: clear to auscultation bilaterally Heart: regular rate and rhythm, S1, S2 normal, no murmur, click, rub or gallop Extremities: extremities normal, atraumatic, no cyanosis or edema  EKG not performed today  ASSESSMENT AND PLAN:   Hypertension History of hypertension blood pressure measured today at 128/70. He is on amlodipine, Cartia, losartan and metoprolol. I'm going to stop the amlodipine and increase the Cartia 120-180 mg a day.  Dyslipidemia History of hyperlipidemia intolerant to statin therapy  Pulmonary embolism without acute cor pulmonale (HCC) History of pulmonary embolism in February 2016 after a right total knee replacement on Coumadin anticoagulation for 6 months.  Paroxysmal atrial fibrillation (HCC) History of PAF during recent hospitalization at [facility] during an  episode of COPD exacerbation. He converted to normal sinus rhythm and was placed on Coumadin anticoagulation.  Left ventricular dysfunction 2-D echocardiogram at [facility] during recent hospitalization earlier this month revealed an ejection fraction of 40-45% with mild to moderate RV dysfunction.  Acute combined systolic and diastolic heart failure ([place]) The patient was hospitalized earlier this month with COPD exacerbation. 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 2-D echo revealed an EF of 45%. He remains on furosemide 40 mg by mouth twice a day and is  aware of some restriction.  Abdominal aortic aneurysm (AAA) ([place]) History of abdominal aortic aneurysm recently measured at 5.7 cm by CT scan. We will remeasure by abdominal ultrasound. He probably will need this revascularized would be high-risk for an open procedure. Hopefully he can be done percutaneously with endoluminal stent graft. I'm going to get a pharmacologic Myoview stress test to risk stratify him and refer him to Dr. Trula Slade for surgical evaluation.      Rodney Harp MD FACP,FACC,FAHA, [facility] 07/12/2016 10:27 AM

## 2016-07-12 NOTE — Assessment & Plan Note (Signed)
History of hypertension blood pressure measured today at 128/70. He is on amlodipine, Cartia, losartan and metoprolol. I'm going to stop the amlodipine and increase the Cartia 120-180 mg a day.

## 2016-07-18 ENCOUNTER — Telehealth (HOSPITAL_COMMUNITY): Payer: Self-pay

## 2016-07-18 NOTE — Telephone Encounter (Signed)
Encounter complete. 

## 2016-07-20 ENCOUNTER — Ambulatory Visit (HOSPITAL_COMMUNITY)
Admission: RE | Admit: 2016-07-20 | Discharge: 2016-07-20 | Disposition: A | Discharge status: Home / Self Care | Payer: Medicare HMO | Source: Ambulatory Visit | Attending: Cardiovascular Disease | Admitting: Cardiovascular Disease

## 2016-07-20 DIAGNOSIS — Z8673 Personal history of transient ischemic attack (TIA), and cerebral infarction without residual deficits: Secondary | ICD-10-CM | POA: Diagnosis not present

## 2016-07-20 DIAGNOSIS — R0602 Shortness of breath: Secondary | ICD-10-CM | POA: Insufficient documentation

## 2016-07-20 DIAGNOSIS — Z01818 Encounter for other preprocedural examination: Secondary | ICD-10-CM | POA: Insufficient documentation

## 2016-07-20 DIAGNOSIS — I714 Abdominal aortic aneurysm, without rupture, unspecified: Secondary | ICD-10-CM

## 2016-07-20 DIAGNOSIS — J449 Chronic obstructive pulmonary disease, unspecified: Secondary | ICD-10-CM | POA: Insufficient documentation

## 2016-07-20 DIAGNOSIS — Z87891 Personal history of nicotine dependence: Secondary | ICD-10-CM | POA: Insufficient documentation

## 2016-07-20 DIAGNOSIS — Z8249 Family history of ischemic heart disease and other diseases of the circulatory system: Secondary | ICD-10-CM | POA: Diagnosis not present

## 2016-07-20 DIAGNOSIS — I119 Hypertensive heart disease without heart failure: Secondary | ICD-10-CM | POA: Diagnosis not present

## 2016-07-20 DIAGNOSIS — I480 Paroxysmal atrial fibrillation: Secondary | ICD-10-CM | POA: Diagnosis not present

## 2016-07-20 DIAGNOSIS — E119 Type 2 diabetes mellitus without complications: Secondary | ICD-10-CM | POA: Insufficient documentation

## 2016-07-20 LAB — MYOCARDIAL PERFUSION IMAGING
CHL CUP NUCLEAR SDS: 1
CHL CUP NUCLEAR SRS: 3
CHL CUP NUCLEAR SSS: 4
CHL CUP RESTING HR STRESS: 64 {beats}/min
CHL CUP STRESS STAGE 1 DBP: 82 mmHg
CHL CUP STRESS STAGE 1 SBP: 138 mmHg
CHL CUP STRESS STAGE 3 GRADE: 0 %
CHL CUP STRESS STAGE 3 SPEED: 0 mph
CHL CUP STRESS STAGE 4 SBP: 136 mmHg
CSEPEW: 1 METS
CSEPPBP: 150 mmHg
CSEPPHR: 76 {beats}/min
LV dias vol: 166 mL (ref 62–150)
LVSYSVOL: 98 mL
NUC STRESS TID: 1.03
Percent of predicted max HR: 51 %
Stage 1 Grade: 0 %
Stage 1 HR: 62 {beats}/min
Stage 1 Speed: 0 mph
Stage 2 Grade: 0 %
Stage 2 HR: 61 {beats}/min
Stage 2 Speed: 0 mph
Stage 3 DBP: 83 mmHg
Stage 3 HR: 76 {beats}/min
Stage 3 SBP: 150 mmHg
Stage 4 DBP: 75 mmHg
Stage 4 Grade: 0 %
Stage 4 HR: 77 {beats}/min
Stage 4 Speed: 0 mph

## 2016-07-20 MED ORDER — TECHNETIUM TC 99M TETROFOSMIN IV KIT
11.0000 | PACK | Freq: Once | INTRAVENOUS | Status: AC | PRN
  Administered 2016-07-20: 11 via INTRAVENOUS
  Filled 2016-07-20: qty 11

## 2016-07-20 MED ORDER — TECHNETIUM TC 99M TETROFOSMIN IV KIT
31.0000 | PACK | Freq: Once | INTRAVENOUS | Status: AC | PRN
  Administered 2016-07-20: 31 via INTRAVENOUS
  Filled 2016-07-20: qty 31

## 2016-07-20 MED ORDER — REGADENOSON 0.4 MG/5ML IV SOLN
0.4000 mg | Freq: Once | INTRAVENOUS | Status: AC
  Administered 2016-07-20: 0.4 mg via INTRAVENOUS

## 2016-07-21 ENCOUNTER — Ambulatory Visit (INDEPENDENT_AMBULATORY_CARE_PROVIDER_SITE_OTHER)
Admission: RE | Admit: 2016-07-21 | Discharge: 2016-07-21 | Disposition: A | Discharge status: Home / Self Care | Payer: Medicare HMO | Source: Ambulatory Visit | Attending: Internal Medicine | Admitting: Internal Medicine

## 2016-07-21 ENCOUNTER — Ambulatory Visit (INDEPENDENT_AMBULATORY_CARE_PROVIDER_SITE_OTHER): Payer: Medicare HMO | Admitting: Internal Medicine

## 2016-07-21 ENCOUNTER — Encounter: Payer: Self-pay | Admitting: Internal Medicine

## 2016-07-21 VITALS — BP 120/70 | HR 68 | Ht 70.5 in | Wt 230.0 lb

## 2016-07-21 DIAGNOSIS — J9601 Acute respiratory failure with hypoxia: Secondary | ICD-10-CM

## 2016-07-21 DIAGNOSIS — J449 Chronic obstructive pulmonary disease, unspecified: Secondary | ICD-10-CM

## 2016-07-21 NOTE — Assessment & Plan Note (Signed)
Spirometry 07/21/2016  FEV1 1.05 (32%)  Ratio 39  But f/v not physiologic  Based on baseline activity tolerance he's likely truly at GOLD I/II and has presently Pt is Group B in terms of symptom/risk and laba/lama therefore appropriate rx at this point but already has home neb he's not using as not really limited from doing desired activities at present.    Main risksfor surgery is low Cabd / pain limiting deep breathing/ cough mechanics which I discussed at length in terms of how to minimize them  But they are not in my opinion prohibitive in setting of AAA nearing 6 cm diam due to risk of death from rupture  Discussed in detail all the  indications, usual  risks and alternatives  relative to the benefits with patient who agrees to proceed with surgical eval as planned  Total time devoted to counseling  = 35/84mreview case with pt/wife and hosp records from [place]/ discussion of options/alternatives/ personally creating written instructions  in presence of pt  then going over those specific  Instructions directly with the pt including how to use all of the meds but in particular covering each new medication in detail and the difference between the maintenance/automatic meds and the prns using an action plan format for the latter.

## 2016-07-21 NOTE — Assessment & Plan Note (Signed)
sats ok now on 2lpm and uses bipap/ 2lpm hs and should not consider changing rx until well out from AAA surgery > we can see him for this or let Dr Melina Copa handle it noting the bipap is per neurology, not sleep medicine here.

## 2016-07-21 NOTE — Assessment & Plan Note (Signed)
Body mass index is 32.54   Lab Results  Component Value Date   TSH 0.772 04/25/2016     Contributing to gerd tendency/ doe/reviewed the need and the process to achieve and maintain neg calorie balance > defer f/u primary care including intermittently monitoring thyroid status

## 2016-07-21 NOTE — Patient Instructions (Addendum)
Please remember to go to the  x-ray department downstairs for your tests - we will call you with the results when they are available.  The goal for 02 saturations is 94 or better   You are cleared for surgery but will need to mobilize as soon as possible and use your Incentive spirometry as much as possible post op   We will need to see you 2 weeks after you get out home from surgery with all active medications in hand or let Dr Melina Copa handle this

## 2016-07-21 NOTE — Progress Notes (Signed)
Subjective:     Patient ID: Rodney Orozco, male   DOB: Aug 29, 1941,    MRN: [id number]  HPI   68 yowm  Quit smoking 10/2013  At 17 with MO/ dm/ osa acutely ill at [facility] with CAP 11/20/82  complicated afib no life support but d/c on 02 2lpm  / home neb referred to pulmonary clinic 07/21/2016 by Dr  Melina Copa re AAA clear for surgery.   07/21/2016 1st [facility] office visit/ Attie Nawabi   Chief Complaint  Patient presents with  . Pulmonary Consult    Referred by Dr. [name]. Pt was on vacation at [facility] 1 month ago and got sick with PNA and was sent home 06/20/16 with O2. He has known OSA and is on BIPAP. He was started on o2 recent admit to hospital on 06/20/16. He states that his breathgin has improved, but not back to his normal baseline.    baseline = doe [facility] = can't walk a nl pace on a flat grade s sob but does fine slow and flat eg walking on beach Now doe  = [facility] = can't walk 100 yards even at a slow pace at a flat grade s stopping due to sob  - can't do [employer] even on 02  Cough was productive green, now gone Able to lie back on bipap almost flat  No obvious day to day or daytime variability or ongoing excess/ purulent sputum or mucus plugs or hemoptysis or cp or chest tightness, subjective wheeze or overt sinus or hb symptoms. No unusual exp hx or h/o childhood pna/ asthma or knowledge of premature birth.  Sleeping ok without nocturnal  or early am exacerbation  of respiratory  c/o's or need for noct saba. Also denies any obvious fluctuation of symptoms with weather or environmental changes or other aggravating or alleviating factors except as outlined above   Current Medications, Allergies, Complete Past Medical History, Past Surgical History, Family History, and Social History were reviewed in [employer] record.  ROS  The following are not active complaints unless bolded sore throat, dysphagia, dental problems, itching, sneezing,  nasal  congestion or excess/ purulent secretions, ear ache,   fever, chills, sweats, unintended wt loss, classically pleuritic or exertional cp,  orthopnea pnd or leg swelling, presyncope, palpitations, abdominal pain, anorexia, nausea, vomiting, diarrhea  or change in bowel or bladder habits, change in stools or urine, dysuria,hematuria,  rash, arthralgias, visual complaints, headache, numbness, weakness or ataxia or problems with walking or coordination,  change in mood/affect or memory.                Review of Systems     Objective:   Physical Exam   Obese amb wm nad  Wt Readings from Last 3 Encounters:  07/21/16 230 lb (104.3 kg)  07/20/16 228 lb (103.4 kg)  07/12/16 228 lb (103.4 kg)    Vital signs reviewed - note 97% on arrival on 2lpm     HEENT: nl  turbinates, and oropharynx. Nl external ear canals without cough reflex- full set dentures    NECK :  without JVD/Nodes/TM/ nl carotid upstrokes bilaterally   LUNGS: no acc muscle use,  Nl contour chest which is clear to A and P bilaterally without cough on insp or exp maneuvers   CV:  RRR  no s3 or murmur or increase in P2, no edema   ABD:  soft and nontender with limited inspiratory excursion in the supine position and  end insp hoover's. No bruits or organomegaly, bowel sounds nl/ surgical scar linear from xiphoid to umbilicus   MS:  Nl gait/ ext warm without deformities, calf tenderness, cyanosis or clubbing No obvious joint restrictions   SKIN: warm and dry without lesions    NEURO:  alert, approp, nl sensorium with  no motor deficits     CXR PA and Lateral:   07/21/2016 :    I personally reviewed images and agree with radiology impression as follows:    Resolution of changes in the right lung base. 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    Assessment:

## 2016-07-22 ENCOUNTER — Encounter: Payer: Self-pay | Admitting: Internal Medicine

## 2016-07-24 ENCOUNTER — Telehealth: Payer: Self-pay | Admitting: Internal Medicine

## 2016-07-24 ENCOUNTER — Ambulatory Visit (HOSPITAL_COMMUNITY)
Admission: RE | Admit: 2016-07-24 | Discharge: 2016-07-24 | Disposition: A | Discharge status: Home / Self Care | Payer: Medicare HMO | Source: Ambulatory Visit | Attending: Cardiology | Admitting: Cardiology

## 2016-07-24 DIAGNOSIS — I480 Paroxysmal atrial fibrillation: Secondary | ICD-10-CM | POA: Insufficient documentation

## 2016-07-24 DIAGNOSIS — I700 Atherosclerosis of aorta: Secondary | ICD-10-CM | POA: Insufficient documentation

## 2016-07-24 DIAGNOSIS — I714 Abdominal aortic aneurysm, without rupture, unspecified: Secondary | ICD-10-CM

## 2016-07-24 DIAGNOSIS — I708 Atherosclerosis of other arteries: Secondary | ICD-10-CM | POA: Insufficient documentation

## 2016-07-24 DIAGNOSIS — Z01818 Encounter for other preprocedural examination: Secondary | ICD-10-CM | POA: Insufficient documentation

## 2016-07-24 DIAGNOSIS — I723 Aneurysm of iliac artery: Secondary | ICD-10-CM | POA: Insufficient documentation

## 2016-07-24 NOTE — Progress Notes (Signed)
lmtcb x1 

## 2016-07-24 NOTE — Telephone Encounter (Signed)
Notes Recorded by Osa Craver, CMA on 07/24/2016 at 9:25 AM EDT lmtcb x1 ------ Notes Recorded by Tanda Rockers, MD on 07/21/2016 at 5:15 PM EDT Call pt: Reviewed cxr and no acute change so no change in recommendations made at ov -------------------------------------------------------------------------------- Spoke with pt's wife, [place]. She is aware of his results. Nothing further was needed.

## 2016-07-26 ENCOUNTER — Telehealth: Payer: Self-pay | Admitting: *Deleted

## 2016-07-26 NOTE — Telephone Encounter (Signed)
-----   Message from Lorretta Harp, MD sent at 07/20/2016  8:52 PM EDT ----- Low risk study No ischemia. LVEF by gated SPECT similar to 2D EF

## 2016-07-26 NOTE — Progress Notes (Signed)
Spoke with pt and notified of results per Dr. Wert. Pt verbalized understanding and denied any questions. 

## 2016-07-26 NOTE — Telephone Encounter (Signed)
-----   Message from Lorretta Harp, MD sent at 07/24/2016  3:24 PM EDT ----- Return office visit with me to discuss results at next available

## 2016-07-26 NOTE — Telephone Encounter (Signed)
Spoke to patient's wife. Myoview,AAA duplex Result given . Verbalized understanding  appointment schedule for 10 /11/17 at 3:45.  wife states patient has an appointment with Dr Scot Dock concerning  AAA.- 08/03/16 . APPOINTMENT WAS set up by Dr Gwenlyn Found.

## 2016-07-28 ENCOUNTER — Encounter: Payer: Self-pay | Admitting: Vascular Surgery

## 2016-08-03 ENCOUNTER — Encounter: Payer: Self-pay | Admitting: Vascular Surgery

## 2016-08-03 ENCOUNTER — Ambulatory Visit (INDEPENDENT_AMBULATORY_CARE_PROVIDER_SITE_OTHER): Payer: Medicare HMO | Admitting: Vascular Surgery

## 2016-08-03 VITALS — BP 181/70 | HR 72 | Ht 70.5 in | Wt 239.0 lb

## 2016-08-03 DIAGNOSIS — I714 Abdominal aortic aneurysm, without rupture, unspecified: Secondary | ICD-10-CM

## 2016-08-03 NOTE — Progress Notes (Signed)
Patient name: Rodney Orozco MRN: [id number] DOB: 1941/08/27 Sex: male  REASON FOR CONSULT: Abdominal aortic aneurysm referred by Dr. Quay Burow.Marland Kitchen   HPI: Rodney Orozco is a 75 y.o. male, who is referred with an abdominal aortic aneurysm. He was hospitalized in February and underwent a CT scan of the chest to rule out a pulmonary embolus. An incidental finding was a 5.7 cm infrarenal abdominal aortic aneurysm and I do not see that a formal CT of the abdomen was performed. This was done in new [facility]. The patient says that he was seen many years ago with a small abdominal aortic aneurysm but then was lost to follow up. He is unaware of any family history of aneurysmal disease.  He denies any abdominal pain or back pain.  He has had previous abdominal surgery for peptic ulcer disease.  I have reviewed the records that were sent with the patient. He has a greater than 100-pack-year history of smoking. He quit smoking 5 years ago. He does not tolerate statins. He was recently hospitalized in new [place] with an exacerbation of COPD. He also had acute systolic and diastolic heart failure. During that admission he had A. fib with rapid ventricular response and converted back to normal sinus rhythm. Note Sadie was placed on Coumadin for this. However, he is also on Coumadin because he had a pulmonary embolus in February 2016 after a right total knee replacement.  Past Medical History:  Diagnosis Date  . A-fib ([place])   . AAA (abdominal aortic aneurysm) ([place])   . Atrial fibrillation with RVR ([place])   . Atypical psychosis   . CHF (congestive heart failure) ([place])   . Diabetes mellitus without complication ([place])   . Dyslipidemia   . Hypertension   . Mixed hyperlipidemia   . Osteoarthritis   . Pneumonia December 2014   Had hemoptysis and admitted at [facility].  . Stroke [facility])   . Vitamin D deficiency     Family History  Problem Relation Age of Onset  .  Dementia Mother   . Stroke Mother   . Heart attack Father     SOCIAL HISTORY: Social History   Social History  . Marital status: Married    Spouse name: [name]  . Number of children: 3  . Years of education: 5   Occupational History  . Retired    Social History Main Topics  . Smoking status: Former Smoker    Packs/day: 2.00    Years: 50.00    Types: Cigarettes    Quit date: 11/07/2013  . Smokeless tobacco: Never Used  . Alcohol use No  . Drug use: No  . Sexual activity: Not on file   Other Topics Concern  . Not on file   Social History Narrative   Lives with wife   Caffeine use: 4 cups /day (coffee)    Allergies  Allergen Reactions  . Codeine Other (See Comments)    Hallucinations  . Tramadol Dermatitis  . Lipitor [Atorvastatin] Other (See Comments)    Joint pain  . Penicillins Rash    HIves/rash    Current Outpatient Prescriptions  Medication Sig Dispense Refill  . albuterol (PROVENTIL) (2.5 MG/3ML) 0.083% nebulizer solution Inhale 3 mLs into the lungs as needed for shortness of breath.    . diltiazem (CARDIZEM CD) 180 MG 24 hr capsule Take 1 capsule (180 mg total) by mouth daily. 90 capsule 3  . donepezil (ARICEPT) 10 MG tablet  Take 1 tablet (10 mg total) by mouth at bedtime. 30 tablet 12  . famotidine (PEPCID) 20 MG tablet Take 1 tablet by mouth 2 (two) times daily.    . furosemide (LASIX) 40 MG tablet Take 1 tablet by mouth 2 (two) times daily.    Marland Kitchen losartan (COZAAR) 100 MG tablet Take 100 mg by mouth daily.    . metFORMIN (GLUCOPHAGE-XR) 500 MG 24 hr tablet Take 1,000 mg by mouth 2 (two) times daily.    . metoprolol tartrate (LOPRESSOR) 25 MG tablet Take 1 tablet by mouth 2 (two) times daily.    Marland Kitchen NOVOLIN N RELION 100 UNIT/ML injection Inject 35 Units into the skin 2 (two) times daily.     . OXYGEN 2lpm 24/7  DME- [employer]    . VICTOZA 18 MG/3ML SOPN Inject 1.2 mg into the skin daily.    Marland Kitchen warfarin (COUMADIN) 5 MG tablet Take 1.5 tablets by mouth  daily. For 14 days. Being checked again 07/13/16     No current facility-administered medications for this visit.     REVIEW OF SYSTEMS:  '[X]'$  denotes positive finding, '[ ]'$  denotes negative finding Cardiac  Comments:  Chest pain or chest pressure:    Shortness of breath upon exertion: X   Short of breath when lying flat:    Irregular heart rhythm: X History of A. fib       Vascular    Pain in calf, thigh, or hip brought on by ambulation:    Pain in feet at night that wakes you up from your sleep:     Blood clot in your veins:    Leg swelling:         Pulmonary    Oxygen at home: X   Productive cough:     Wheezing:         Neurologic    Sudden weakness in arms or legs:     Sudden numbness in arms or legs:     Sudden onset of difficulty speaking or slurred speech:    Temporary loss of vision in one eye:     Problems with dizziness:         Gastrointestinal    Blood in stool:     Vomited blood:         Genitourinary    Burning when urinating:     Blood in urine:        Psychiatric    Major depression:         Hematologic    Bleeding problems:    Problems with blood clotting too easily:        Skin    Rashes or ulcers:        Constitutional    Fever or chills:      PHYSICAL EXAM: Vitals:   08/03/16 0829  BP: (!) 173/91  Pulse: 72  SpO2: 93%  Weight: 239 lb (108.4 kg)  Height: 5' 10.5" (1.791 m)  Body mass index is 33.81 kg/m.   GENERAL: The patient is a well-nourished male, in no acute distress. The vital signs are documented above. CARDIAC: There is a regular rate and rhythm.  VASCULAR: I do not detect carotid bruits. On the right side, he has a palpable femoral, popliteal, and posterior tibial pulse. On the left side, he has a palpable femoral pulse and posterior tibial pulse. He does not have any significant lower extremity swelling. PULMONARY: There is good air exchange bilaterally without wheezing or rales. ABDOMEN: Soft and  non-tender with  normal pitched bowel sounds.  He is obese and I cannot palpate his abdominal aortic aneurysm. He has a large midline incision. MUSCULOSKELETAL: There are no major deformities or cyanosis. NEUROLOGIC: No focal weakness or paresthesias are detected. SKIN: There are no ulcers or rashes noted. PSYCHIATRIC: The patient has a normal affect.  DATA:   ULTRASOUND OF ABDOMINAL AORTA: Ultrasound of his abdominal aorta shows a 5.7 cm infrarenal abdominal aortic aneurysm.  CT scan of the chest in February showed a pulmonary embolus. He is on Coumadin for this.  MYOCARDIAL PERFUSION SCAN: This was a low risk study with no areas of ischemia noted.  2-D ECHO: His most recent 2-D echo earlier this year showed an ejection fraction of 45%.   MEDICAL ISSUES:  5.7 CM INFRARENAL ABDOMINAL AORTIC ANEURYSM: Given the size of the aneurysm I would recommend elective repair. The risk of rupture for an aneurysm of this size is approximately 10%-15 per year. He is at increased risk for surgery for multiple reasons including his obesity, COPD (on home O2), and history of significant congestive heart failure. I have ordered a CT angioma of the abdomen and pelvis in order to determine if he is a candidate for an endovascular stent graft which would be ideal in this case. However we need to be sure that anatomically he is an acceptable candidate. He is also on Coumadin because of his history of pulmonary embolus and we'll need to decide if this can be discontinued prior to surgery indefinitely or if it needs to be restarted postoperatively. Once the CT scan is done I'll bring him back and discuss repair of his aneurysm. We have today briefly discussed the indications for repair and the potential complications of open and endovascular repair. Fortunately he is now longer a smoker.   Deitra Mayo [facility] [phone]

## 2016-08-03 NOTE — Addendum Note (Signed)
Addended by: Kaleen Mask on: 08/03/2016 01:20 PM   Modules accepted: Orders

## 2016-08-08 ENCOUNTER — Encounter: Payer: Self-pay | Admitting: Nurse Practitioner

## 2016-08-08 ENCOUNTER — Ambulatory Visit: Payer: Self-pay | Admitting: Neurology

## 2016-08-08 ENCOUNTER — Telehealth: Payer: Self-pay

## 2016-08-08 ENCOUNTER — Ambulatory Visit (INDEPENDENT_AMBULATORY_CARE_PROVIDER_SITE_OTHER): Payer: Medicare HMO | Admitting: Nurse Practitioner

## 2016-08-08 VITALS — BP 158/85 | HR 74 | Ht 70.5 in | Wt 237.2 lb

## 2016-08-08 DIAGNOSIS — G4731 Primary central sleep apnea: Secondary | ICD-10-CM | POA: Insufficient documentation

## 2016-08-08 DIAGNOSIS — G4733 Obstructive sleep apnea (adult) (pediatric): Secondary | ICD-10-CM

## 2016-08-08 NOTE — Telephone Encounter (Signed)
I spoke to wife and reschedule pt to see Hoyle Sauer NP today, Dr. Rexene Alberts is out sick.

## 2016-08-08 NOTE — Patient Instructions (Signed)
High leak on compliance report Order to refit mask given to patient  F/U with Dr. Rexene Alberts in 3 months

## 2016-08-08 NOTE — Progress Notes (Signed)
[facility]  PATIENT: Rodney Orozco DOB: 03-02-1941   REASON FOR VISIT: Follow-up for obstructive sleep apnea on BiPAP HISTORY FROM: Patient and wife    HISTORY OF PRESENT ILLNESS: 05/15/16 SAconsultation of his sleep disorder, in particular, concern for underlying obstructive sleep apnea. The patient is accompanied by his wife today. As you know, Rodney Orozco is a 75 year old right-handed gentleman with an underlying medical history of diabetes, history of pneumonia, hypertension, hyperlipidemia, arthritis, status post knee surgeries (TKA in Feb. and Dec. 2016), s/p low back surgery, status post remote abdominal surgery, remote history of smoking but previously heavy smoking history (quit about 4 years ago), and obesity, who reports snoring and excessive daytime somnolence. I reviewed your office note from 04/25/2016. Patient has a history of memory loss. He also has a history of B12 deficiency. You have started him on Aricept recently. His Epworth sleepiness score is 16 out of 24 today, his fatigue score is 44 out of 63. He goes to bed around 10 PM and falls asleep fairly quickly. Wake up time is around 4:30 to 5 AM. They are early risers. He does get up to use the bathroom about 3-4 times on an average night. He has fairly frequent morning headaches and often takes [place] powder every day. He does not have any restless leg symptoms and is not known to twitch his legs or cake and his sleep according to the wife. She has witnessed breathing pauses while he is asleep. He takes a nap usually once in the afternoon after lunch and sleeps for about an hour. He has a family history of obstructive sleep apnea in 3 sisters who all have CPAP machines. He would be willing to try CPAP himself. He does not watch TV at night. He quit smoking about 4 years ago. He is retired and lives with his wife. They have grown children, one next door. He has been tolerating Aricept at 10 mg once daily.  UPDATE 08/08/2016 CMMr.Salamone, 75 year old male returns for follow-up. He had split night study on 05/30/2016 which concluded obstructive sleep apnea, complex sleep apnea, nocturnal hypoxemia and nonspecific abnormal electrocardiogram. He is currently using BiPAP. He was seen by pulmonology and his O2 sats were normal at that visit. [place] the [place] and admitted to the hospital 06/21/2016 with increasing shortness of breath associated with a cough,  O2 sats were in the 80s, he then went into atrial fibrillation with a rapid ventricular response . CT of the chest consistent with COPD and pneumonia and incidental finding of abdominal aortic aneurysm was noted. He is due to see vascular surgery in a  couple of weeks. He returns today for BiPAP compliance. He has 97% compliance for average usage 7 hours 33 minutes . IPAP of 13 cm EPAP 9 cm. AHI 2.3 moderate leak.  REVIEW OF SYSTEMS: Full 14 system review of systems performed and notable only for those listed, all others are neg:  Constitutional: neg  Cardiovascular: neg Ear/Nose/Throat: neg  Skin: neg Eyes: neg Respiratory: neg Gastroitestinal: neg  Hematology/Lymphatic: neg  Endocrine: neg Musculoskeletal:neg Allergy/Immunology: neg Neurological: neg Psychiatric: neg Sleep : Obstructive sleep apnea with BiPAP ALLERGIES: Allergies  Allergen Reactions  . Codeine Other (See Comments)    Hallucinations  . Tramadol Dermatitis  . Lipitor [Atorvastatin] Other (See Comments)    Joint pain  . Penicillins Rash    HIves/rash    HOME MEDICATIONS: Outpatient Medications Prior to Visit  Medication Sig Dispense Refill  .  albuterol (PROVENTIL) (2.5 MG/3ML) 0.083% nebulizer solution Inhale 3 mLs into the lungs as needed for shortness of breath.    . diltiazem (CARDIZEM CD) 180 MG 24 hr capsule Take 1 capsule (180 mg total) by mouth daily. 90 capsule 3  . donepezil (ARICEPT) 10 MG tablet Take 1 tablet (10 mg total) by mouth at bedtime. 30  tablet 12  . famotidine (PEPCID) 20 MG tablet Take 1 tablet by mouth 2 (two) times daily.    . furosemide (LASIX) 40 MG tablet Take 1 tablet by mouth 2 (two) times daily.    Rodney Orozco losartan (COZAAR) 100 MG tablet Take 100 mg by mouth daily.    . metFORMIN (GLUCOPHAGE-XR) 500 MG 24 hr tablet Take 1,000 mg by mouth 2 (two) times daily.    . metoprolol tartrate (LOPRESSOR) 25 MG tablet Take 1 tablet by mouth 2 (two) times daily.    Rodney Orozco NOVOLIN N RELION 100 UNIT/ML injection Inject 35 Units into the skin 2 (two) times daily.     . OXYGEN 2lpm 24/7  DME- [facility]    . VICTOZA 18 MG/3ML SOPN Inject 1.2 mg into the skin daily.    Rodney Orozco warfarin (COUMADIN) 5 MG tablet Take 1.5 tablets by mouth daily. For 14 days. Being checked again 07/13/16     No facility-administered medications prior to visit.     PAST MEDICAL HISTORY: Past Medical History:  Diagnosis Date  . A-fib ([place])   . AAA (abdominal aortic aneurysm) ([place])   . Atrial fibrillation with RVR ([place])   . Atypical psychosis   . CHF (congestive heart failure) ([place])   . Diabetes mellitus without complication ([place])   . Dyslipidemia   . Hypertension   . Mixed hyperlipidemia   . Osteoarthritis   . Pneumonia December 2014   Had hemoptysis and admitted at [facility].  . Stroke [facility])   . Vitamin D deficiency     PAST SURGICAL HISTORY: Past Surgical History:  Procedure Laterality Date  . ABDOMINAL SURGERY  1999  . BACK SURGERY  1992  . KNEE SURGERY  Feb 2016/Dec 2016    FAMILY HISTORY: Family History  Problem Relation Age of Onset  . Dementia Mother   . Stroke Mother   . Heart attack Father     SOCIAL HISTORY: Social History   Social History  . Marital status: Married    Spouse name: [name]  . Number of children: 3  . Years of education: 34   Occupational History  . Retired    Social History Main Topics  . Smoking status: Former Smoker    Packs/day: 2.00    Years: 50.00    Types: Cigarettes    Quit date: 11/07/2013  .  Smokeless tobacco: Never Used  . Alcohol use No  . Drug use: No  . Sexual activity: Not on file   Other Topics Concern  . Not on file   Social History Narrative   Lives with wife   Caffeine use: 4 cups /day (coffee)     PHYSICAL EXAM  Vitals:   08/08/16 1327  BP: (!) 158/85  Pulse: 74  Weight: 237 lb 3.2 oz (107.6 kg)  Height: 5' 10.5" (1.791 m)   Body mass index is 33.55 kg/m.  Generalized: Well developed, in no acute distress ,Well-groomed Head: normocephalic and atraumatic,. Oropharynx benign  Neck: Supple, no carotid bruits  Cardiac regular rate and rhythm Neurological examination   Mentation: Alert oriented to time, place, history taking. Attention span and concentration  appropriate. Recent and remote memory intact.  Follows all commands speech and language fluent.   Cranial nerve II-XII: Fundoscopic exam reveals sharp disc margins.Pupils were equal round reactive to light extraocular movements were full, visual field were full on confrontational test. Facial sensation and strength were normal. hearing was intact to finger rubbing bilaterally. Uvula tongue midline. head turning and shoulder shrug were normal and symmetric.Tongue protrusion into cheek strength was normal. Motor: normal bulk and tone, full strength in the BUE, BLE, fine finger movements normal, no pronator drift.  Sensory: normal and symmetric to light touch, pinprick, and  Vibration, in the upper and lower extremities Coordination: finger-nose-finger, heel-to-shin bilaterally, no dysmetria Reflexes: Symmetric upper and lower, plantar responses were flexor bilaterally. Gait and Station: Rising up from seated position without assistance, wide based  stance,  tandem gait is unsteady DIAGNOSTIC DATA (LABS, IMAGING, TESTING) - I reviewed patient records, labs, notes, testing and imaging myself where available.    Lab Results  Component Value Date   [id number] 04/25/2016   Lab Results  Component  Value Date   TSH 0.772 04/25/2016      ASSESSMENT AND PLAN  75 y.o. year old male  has a past medical history of A-fib ([place]); AAA (abdominal aortic aneurysm) ([place]); Atrial fibrillation with RVR ([place]);  CHF (congestive heart failure) ([place]); Diabetes mellitus without complication ([place]); Dyslipidemia; Hypertension; Mixed hyperlipidemia; Osteoarthritis; Pneumonia (December 2014); Stroke [facility]); and Vitamin D deficiency. to follow-up for his obstructive sleep apnea with BiPAP.Admitted to the hospital 06/21/2016 with increasing shortness of breath associated with a cough,  O2 sats were in the 80s, he then went into atrial fibrillation with a rapid ventricular response . CT of the chest consistent with COPD and pneumonia and incidental finding of abdominal aortic aneurysm was noted. The patient is a current patient of Dr. Rexene Alberts  who is out of the office today . This note is sent to the work in doctor.     PLAN: High leak on compliance report Order to refit mask given to patient he will go by [facility] Greater than 50% of time during this 25 minute visit was spent on counseling,explanation and education of diagnosis, planning of further management, discussion with patient and coordination of care and reviewing hospital records. F/U with Dr. Rexene Alberts in 3 months Dennie Bible, [facility], [facility], [facility] [address] [place], [place] [address] [phone]

## 2016-08-09 NOTE — Progress Notes (Signed)
I agree with the assessment and plan as directed by NP .The patient is not known to me . Kateri Mc   Tabathia Knoche, MD

## 2016-08-15 ENCOUNTER — Ambulatory Visit (HOSPITAL_COMMUNITY)
Admission: RE | Admit: 2016-08-15 | Discharge: 2016-08-15 | Disposition: A | Discharge status: Home / Self Care | Payer: Medicare HMO | Source: Ambulatory Visit | Attending: Vascular Surgery | Admitting: Vascular Surgery

## 2016-08-15 DIAGNOSIS — I714 Abdominal aortic aneurysm, without rupture, unspecified: Secondary | ICD-10-CM

## 2016-08-15 DIAGNOSIS — R599 Enlarged lymph nodes, unspecified: Secondary | ICD-10-CM | POA: Insufficient documentation

## 2016-08-15 DIAGNOSIS — K4090 Unilateral inguinal hernia, without obstruction or gangrene, not specified as recurrent: Secondary | ICD-10-CM | POA: Insufficient documentation

## 2016-08-15 DIAGNOSIS — I708 Atherosclerosis of other arteries: Secondary | ICD-10-CM | POA: Insufficient documentation

## 2016-08-15 DIAGNOSIS — K760 Fatty (change of) liver, not elsewhere classified: Secondary | ICD-10-CM | POA: Diagnosis not present

## 2016-08-15 DIAGNOSIS — K5730 Diverticulosis of large intestine without perforation or abscess without bleeding: Secondary | ICD-10-CM | POA: Diagnosis not present

## 2016-08-15 LAB — POCT I-STAT CREATININE: CREATININE: 1.2 mg/dL (ref 0.61–1.24)

## 2016-08-15 MED ORDER — IOPAMIDOL (ISOVUE-370) INJECTION 76%
100.0000 mL | Freq: Once | INTRAVENOUS | Status: AC | PRN
  Administered 2016-08-15: 100 mL via INTRAVENOUS

## 2016-08-17 ENCOUNTER — Telehealth: Payer: Self-pay | Admitting: Neurology

## 2016-08-17 NOTE — Telephone Encounter (Signed)
I spoke to wife and advised her why Dr. Rexene Alberts recommended a pulmonology consult. Wife states that after the last sleep study patient was admitted to the hospital a couple of days later with pneumonia. He was placed on BiPAP in ICU during stay. PCP also referred patient to pulmonology and he was seen by Dr. Melvyn Novas in September. Patient is currently on BiPAP and oxygen at home. Wife is cancelling appt with Dr. Elsworth Soho (from our referral) since they have already been seen. Wife also states, with treatment, patient's oxygen does not drop below 90%.  I told her that I will update Dr. Rexene Alberts and she is aware of f/u appt in January.

## 2016-08-17 NOTE — Telephone Encounter (Signed)
Pt's wife called said she looked at [facility] notes and saw he had been referred to Dr Elsworth Soho on 10/11. She called that office and was told Dr Rexene Alberts referred the pt to that office. She is confused and wants to know why. She said she was not aware of this appt.

## 2016-08-18 ENCOUNTER — Encounter: Payer: Self-pay | Admitting: Vascular Surgery

## 2016-08-23 ENCOUNTER — Institutional Professional Consult (permissible substitution): Payer: Medicare HMO | Admitting: Pulmonary Disease

## 2016-08-23 ENCOUNTER — Other Ambulatory Visit: Payer: Self-pay

## 2016-08-23 ENCOUNTER — Ambulatory Visit: Payer: Medicare HMO | Admitting: Cardiovascular Disease

## 2016-08-23 ENCOUNTER — Ambulatory Visit (INDEPENDENT_AMBULATORY_CARE_PROVIDER_SITE_OTHER): Payer: Medicare HMO | Admitting: Vascular Surgery

## 2016-08-23 ENCOUNTER — Encounter: Payer: Self-pay | Admitting: Vascular Surgery

## 2016-08-23 VITALS — BP 161/93 | HR 73 | Temp 97.6°F | Resp 16 | Ht 70.5 in | Wt 236.0 lb

## 2016-08-23 DIAGNOSIS — I714 Abdominal aortic aneurysm, without rupture, unspecified: Secondary | ICD-10-CM

## 2016-08-23 NOTE — Progress Notes (Signed)
Patient name: Rodney Orozco MRN: [id number] DOB: 04/01/1941 Sex: male  REASON FOR VISIT: Follow up of abdominal aortic aneurysm.  HPI: Rodney Orozco is a 75 y.o. male, who I saw in consultation on 08/03/2016 with a 5.7 cm infrarenal abdominal aortic aneurysm. He was hospitalized in February of this year and underwent a CT of the chest to rule out a pulmonary embolus. An incidental finding was his abdominal aortic aneurysm. His scan was done elsewhere. He had a known small abdominal aortic aneurysm and was being followed elsewhere but was then lost to follow up.  He has multiple complicating factors.  He's had previous abdominal surgery for peptic ulcer disease. He has a greater than 100-pack-year history of smoking. He did quit 5 years ago. He does not tolerate statins. He has COPD and also a history of systolic and diastolic heart failure. He is on Coumadin because during his last admission he had atrial fibrillation with a rapid ventricular response and he had a pulmonary embolus in February 2016 after a right total knee replacement.  Past Medical History:  Diagnosis Date  . A-fib ([place])   . AAA (abdominal aortic aneurysm) ([place])   . Atrial fibrillation with RVR ([place])   . Atypical psychosis   . CHF (congestive heart failure) ([place])   . Diabetes mellitus without complication ([place])   . Dyslipidemia   . Hypertension   . Mixed hyperlipidemia   . Osteoarthritis   . Pneumonia December 2014   Had hemoptysis and admitted at [facility].  . Stroke [facility])   . Vitamin D deficiency     Family History  Problem Relation Age of Onset  . Dementia Mother   . Stroke Mother   . Heart attack Father     SOCIAL HISTORY: Social History   Social History  . Marital status: Married    Spouse name: [name]  . Number of children: 3  . Years of education: 91   Occupational History  . Retired    Social History Main Topics  . Smoking status: Former Smoker    Packs/day: 2.00    Years: 50.00     Types: Cigarettes    Quit date: 11/07/2013  . Smokeless tobacco: Never Used  . Alcohol use No  . Drug use: No  . Sexual activity: Not on file   Other Topics Concern  . Not on file   Social History Narrative   Lives with wife   Caffeine use: 4 cups /day (coffee)    Allergies  Allergen Reactions  . Codeine Other (See Comments)    Hallucinations  . Tramadol Dermatitis  . Lipitor [Atorvastatin] Other (See Comments)    Joint pain  . Penicillins Rash    HIves/rash    Current Outpatient Prescriptions  Medication Sig Dispense Refill  . albuterol (PROVENTIL) (2.5 MG/3ML) 0.083% nebulizer solution Inhale 3 mLs into the lungs as needed for shortness of breath.    . diltiazem (CARDIZEM CD) 180 MG 24 hr capsule Take 1 capsule (180 mg total) by mouth daily. 90 capsule 3  . donepezil (ARICEPT) 10 MG tablet Take 1 tablet (10 mg total) by mouth at bedtime. 30 tablet 12  . famotidine (PEPCID) 20 MG tablet Take 1 tablet by mouth 2 (two) times daily.    . furosemide (LASIX) 40 MG tablet Take 1 tablet by mouth 2 (two) times daily.    Marland Kitchen losartan (COZAAR) 100 MG tablet Take 100 mg by mouth daily.    . metFORMIN (  GLUCOPHAGE-XR) 500 MG 24 hr tablet Take 1,000 mg by mouth 2 (two) times daily.    . metoprolol tartrate (LOPRESSOR) 25 MG tablet Take 1 tablet by mouth 2 (two) times daily.    Marland Kitchen NOVOLIN N RELION 100 UNIT/ML injection Inject 35 Units into the skin 2 (two) times daily.     . OXYGEN 2lpm 24/7  DME- [facility]    . VICTOZA 18 MG/3ML SOPN Inject 1.2 mg into the skin daily.    Marland Kitchen warfarin (COUMADIN) 5 MG tablet Take 1.5 tablets by mouth daily. For 14 days. Being checked again 07/13/16     No current facility-administered medications for this visit.     REVIEW OF SYSTEMS:  '[X]'$  denotes positive finding, '[ ]'$  denotes negative finding Cardiac  Comments:  Chest pain or chest pressure:    Shortness of breath upon exertion: X   Short of breath when lying flat:    Irregular heart rhythm: X History  of atrial fibrillation.       Vascular    Pain in calf, thigh, or hip brought on by ambulation:    Pain in feet at night that wakes you up from your sleep:     Blood clot in your veins:    Leg swelling:         Pulmonary    Oxygen at home: X   Productive cough:     Wheezing:         Neurologic    Sudden weakness in arms or legs:     Sudden numbness in arms or legs:     Sudden onset of difficulty speaking or slurred speech:    Temporary loss of vision in one eye:     Problems with dizziness:         Gastrointestinal    Blood in stool:     Vomited blood:         Genitourinary    Burning when urinating:     Blood in urine:        Psychiatric    Major depression:         Hematologic    Bleeding problems:    Problems with blood clotting too easily:        Skin    Rashes or ulcers:        Constitutional    Fever or chills:      PHYSICAL EXAM: Vitals:   08/23/16 1104 08/23/16 1106  BP: (!) 158/103 (!) 161/93  Pulse: 73   Resp: 16   Temp: 97.6 F (36.4 C)   TempSrc: Oral   SpO2: 92%   Weight: 236 lb (107 kg)   Height: 5' 10.5" (1.791 m)     GENERAL: The patient is a well-nourished male, in no acute distress. The vital signs are documented above. CARDIAC: There is a regular rate and rhythm.  VASCULAR: I do not detect carotid bruits. On the right side, he has a palpable femoral, popliteal, and posterior tibial pulse. On the left side, he has a palpable femoral pulse and posterior tibial pulse. He does not have any significant lower extremity swelling. PULMONARY: There is good air exchange bilaterally without wheezing or rales. ABDOMEN: Soft and non-tender with normal pitched bowel sounds.  MUSCULOSKELETAL: There are no major deformities or cyanosis. NEUROLOGIC: No focal weakness or paresthesias are detected. SKIN: There are no ulcers or rashes noted. PSYCHIATRIC: The patient has a normal affect.  DATA:   CT ANGIOGRAM ABDOMEN PELVIS: I have reviewed his  CT  angiogram of the abdomen and pelvis. The maximum diameter of the aneurysm is 6.1 cm. The neck is somewhat short he does have some iliac disease however he appears to be an acceptable candidate for an endovascular aneurysm repair. (L- [id number] cm; [id number])  MEDICAL ISSUES:  6.1 CM INFRARENAL ABDOMINAL AORTIC ANEURYSM:  Given the size of his aneurysm, the risk of rupture is a proximally in the 15% per year. I would therefore recommend elective repair. He would be very high-risk for open repair given his previous abdominal surgery for peptic ulcer disease, obesity, COPD on home oxygen, and significant cardiac history. Fortunately, he does appear to be a reasonable candidate for endovascular aneurysm repair although the neck is somewhat short. He also has some iliac disease.  His myocardial perfusion scan was intermediate risk due to his reduced systolic function. His left ventricular ejection fraction was moderately decreased at 30-45%. There was no evidence of ischemia. We will check with Dr. Jenness Corner office to be sure that he is cleared from a cardiac standpoint.  I have discussed the indications for aneurysm repair. I have explained that without repair, the risk of the aneurysm rupturing is 10-15% per year. We have discussed the advantages and disadvantages of open versus endovascular repair. The patient wishes to proceed with endovascular aneurysm repair (EVAR). I have discussed the potential complications of EVAR, including, but not limited to: bleeding, infection, arterial injury, graft migration, endoleak, renal failure, MI or other unpredictable medical problems. We have discussed the possibility of having to convert to open repair. We also discussed the need for continued lifelong follow-up after EVAR. All of the patients questions were answered and they are agreeable to proceed with surgery.   Deitra Mayo [facility] [phone]

## 2016-08-24 ENCOUNTER — Telehealth: Payer: Self-pay | Admitting: *Deleted

## 2016-08-24 NOTE — Telephone Encounter (Signed)
Spoke with patient's wife, ok per DPR. She said Dr [name], PCP, in [place] manages his coumadin. Advised that patient will need lovenox bridging prior to surgery.

## 2016-08-24 NOTE — Telephone Encounter (Signed)
Requesting surgical clearance:   1. Type of surgery: EVAR: [place]  2. Surgeon: Dr Deitra Mayo   3. Surgical date: 08/31/16  4. Medications that need to be held: Coumadin  5. CAD:      6. I will defer to: Dr Gwynne Edinger: Verdie Mosher Fax- [phone] Phone- [phone]

## 2016-08-24 NOTE — Telephone Encounter (Signed)
Given his pulmonary embolism he will need Lovenox bridging.

## 2016-08-24 NOTE — Telephone Encounter (Signed)
Spoke with Jackelyn Poling at Dr Carmie End office. They do not do lovenox bridging at their office.  Spoke with our pharmacist and patient can be added on to appt in the am at 0800.  Verified with patient's wife and they will be here for appt tomorrow at 0800.

## 2016-08-24 NOTE — Telephone Encounter (Signed)
Notified [facility] of patient appt set up for lovenox bridging. Anne Ng will also pull the clearance from [facility] for their record.

## 2016-08-25 ENCOUNTER — Ambulatory Visit (INDEPENDENT_AMBULATORY_CARE_PROVIDER_SITE_OTHER): Payer: Medicare HMO | Admitting: Pharmacist Clinician (PhC)/ Clinical Pharmacy Specialist

## 2016-08-25 ENCOUNTER — Other Ambulatory Visit: Payer: Self-pay | Admitting: Pharmacist Clinician (PhC)/ Clinical Pharmacy Specialist

## 2016-08-25 DIAGNOSIS — I480 Paroxysmal atrial fibrillation: Secondary | ICD-10-CM

## 2016-08-25 DIAGNOSIS — Z7901 Long term (current) use of anticoagulants: Secondary | ICD-10-CM | POA: Diagnosis not present

## 2016-08-25 LAB — POCT INR: INR: 1.5

## 2016-08-25 MED ORDER — RIVAROXABAN 20 MG PO TABS: 20.0000 mg | ORAL_TABLET | Freq: Every day | ORAL | 0 refills | Status: DC

## 2016-08-25 MED ORDER — ENOXAPARIN SODIUM 100 MG/ML ~~LOC~~ SOLN: 100.0000 mg | Freq: Two times a day (BID) | SUBCUTANEOUS | 0 refills | Status: DC

## 2016-08-25 MED ORDER — RIVAROXABAN 20 MG PO TABS
20.0000 mg | ORAL_TABLET | Freq: Every day | ORAL | 3 refills | Status: DC
Start: 2016-08-25 — End: 2016-08-25

## 2016-08-25 NOTE — Patient Instructions (Addendum)
Enoxaprin Dosing Schedule  Enoxparin dose: 100 mg  Date  Warfarin Dose (evenings) Enoxaprin Dose  100 mg  10-13 6 0             9pm  10-14 5 0 9am    9pm  10-15 4 0 9am    9pm  10-16 3 0 9am    9pm  10-17 2 0 9am    9pm  10-18 1 0 9am  10-19 Procedure 0   10-20 1 7.5 mg (1.5 tabs)            9pm  10-'21 2 10 '$ mg (2 tabs) 9am    9pm  10-'22 3 10 '$ mg (2 tabs) 9am    9pm  10-'23 4 10 '$ mg ( 2 tabs) 9am    9pm  10-24 5 7.5 gm (1.5 tabs) 9am    9pm  10-25 6 Repeat INR 9 am

## 2016-08-25 NOTE — Telephone Encounter (Signed)
Patient in office this AM for lovenox bridging for upcoming aneurysm repair scheduled for Thursday Oct 19.  Has been on warfarin since August, at d/c from hospital for AF.   Patient was previously on warfarin for 6 months post provoked embolism after knee surgery.  Also has a history of TIA.  In office this AM discovered that he has only had INR checked 1 time, about 1 week after hospital d/c in August.  Should be followed by Dr. Melina Copa in [place].  At that visit he recalls being told to take an extra 1/2 tablet for 3 days then resume his dose of 7.5 mg daily.  He has not had an INR check since then that he or his wife could recall.  INR in the office today was 1.5.  Unknown if he has been sub-therapeutic for the last 2 months.  Because of this plan was to start lovenox bridge today and instructions were given.  Unfortunately his wife called in mid-afternoon to state that insurance would not cover without prior authorization.    Reviewed with Dr. Stanford Breed (DOD) and we switched the patient to Xarelto 20 mg daily, as monitoring may be a problem.  His wife is to pick up 2 weeks of samples from our [place] office this afternoon, and he will take daily thru Monday.  He will hold Tuesday and Wednesday prior to surgery and restart on Friday once he is home.    Prescription sent to his pharmacy, but if a PA is needed, we now have about 2 weeks of samples to get him thru surgical procedure and will have PA taken care of before samples run out.     Wife voiced understanding of this plan.

## 2016-08-29 ENCOUNTER — Encounter (HOSPITAL_COMMUNITY): Payer: Self-pay

## 2016-08-29 ENCOUNTER — Other Ambulatory Visit (HOSPITAL_COMMUNITY): Payer: Self-pay

## 2016-08-29 ENCOUNTER — Encounter (HOSPITAL_COMMUNITY)
Admission: RE | Admit: 2016-08-29 | Discharge: 2016-08-29 | Disposition: A | Discharge status: Home / Self Care | Payer: Medicare HMO | Source: Ambulatory Visit | Attending: Vascular Surgery | Admitting: Vascular Surgery

## 2016-08-29 LAB — BLOOD GAS, ARTERIAL
ACID-BASE EXCESS: 4.8 mmol/L — AB (ref 0.0–2.0)
BICARBONATE: 28.8 mmol/L — AB (ref 20.0–28.0)
Drawn by: 242311
FIO2: 21
O2 SAT: 93.8 %
PATIENT TEMPERATURE: 98.6
PO2 ART: 67.1 mmHg — AB (ref 83.0–108.0)
pCO2 arterial: 42.1 mmHg (ref 32.0–48.0)
pH, Arterial: 7.449 (ref 7.350–7.450)

## 2016-08-29 LAB — COMPREHENSIVE METABOLIC PANEL
ALK PHOS: 93 U/L (ref 38–126)
ALT: 14 U/L — ABNORMAL LOW (ref 17–63)
ANION GAP: 9 (ref 5–15)
AST: 20 U/L (ref 15–41)
Albumin: 4.1 g/dL (ref 3.5–5.0)
BUN: 16 mg/dL (ref 6–20)
CALCIUM: 9.7 mg/dL (ref 8.9–10.3)
CO2: 29 mmol/L (ref 22–32)
Chloride: 101 mmol/L (ref 101–111)
Creatinine, Ser: 1.29 mg/dL — ABNORMAL HIGH (ref 0.61–1.24)
GFR calc non Af Amer: 53 mL/min — ABNORMAL LOW (ref >60)
Glucose, Bld: 152 mg/dL — ABNORMAL HIGH (ref 65–99)
Potassium: 3.6 mmol/L (ref 3.5–5.1)
SODIUM: 139 mmol/L (ref 135–145)
Total Bilirubin: 0.7 mg/dL (ref 0.3–1.2)
Total Protein: 7.1 g/dL (ref 6.5–8.1)

## 2016-08-29 LAB — SURGICAL PCR SCREEN
MRSA, PCR: NEGATIVE
Staphylococcus aureus: NEGATIVE

## 2016-08-29 LAB — URINALYSIS, ROUTINE W REFLEX MICROSCOPIC
BILIRUBIN URINE: NEGATIVE
Glucose, UA: NEGATIVE mg/dL
Hgb urine dipstick: NEGATIVE
Ketones, ur: NEGATIVE mg/dL
LEUKOCYTES UA: NEGATIVE
NITRITE: NEGATIVE
PH: 6.5 (ref 5.0–8.0)
Protein, ur: NEGATIVE mg/dL
SPECIFIC GRAVITY, URINE: 1.006 (ref 1.005–1.030)

## 2016-08-29 LAB — TYPE AND SCREEN
ABO/RH(D): A NEG
Antibody Screen: NEGATIVE

## 2016-08-29 LAB — CBC
HCT: 42.3 % (ref 39.0–52.0)
Hemoglobin: 14.3 g/dL (ref 13.0–17.0)
MCH: 30.1 pg (ref 26.0–34.0)
MCHC: 33.8 g/dL (ref 30.0–36.0)
MCV: 89.1 fL (ref 78.0–100.0)
PLATELETS: 279 10*3/uL (ref 150–400)
RBC: 4.75 MIL/uL (ref 4.22–5.81)
RDW: 14.7 % (ref 11.5–15.5)
WBC: 10.4 10*3/uL (ref 4.0–10.5)

## 2016-08-29 LAB — APTT: APTT: 32 s (ref 24–36)

## 2016-08-29 LAB — PROTIME-INR
INR: 1.03
Prothrombin Time: 13.5 seconds (ref 11.4–15.2)

## 2016-08-29 LAB — ABO/RH: ABO/RH(D): A NEG

## 2016-08-29 NOTE — Pre-Procedure Instructions (Addendum)
Rodney Orozco  08/29/2016      [facility] [address], [place] [address] Phone: [phone] Fax: [facility] [place], [place] [address] Phone: [phone] Fax: [phone]    Your procedure is scheduled on October 19.  Report to [facility] Admitting at 5:30 A.M.  Call this number if you have problems the morning of surgery:  [phone]   Remember:  Do not eat food or drink liquids after midnight.  Take these medicines the morning of surgery with A SIP OF WATER :diltiazem (CARDIZEM CD), donepezil (ARICEPT),  famotidine (PEPCID), metoprolol tartrate (LOPRESSOR), albuterol (PROVENTIL) STOP ASPIRIN, NSAIDS(ADVIL, ALEVE, IBUPROFEN), HERBAL MEDICATIONS AS OF TODAY XARELTO AS DIRECTED    How to Manage Your Diabetes Before and After Surgery  Why is it important to control my blood sugar before and after surgery? . Improving blood sugar levels before and after surgery helps healing and can limit problems. . A way of improving blood sugar control is eating a healthy diet by: o  Eating less sugar and carbohydrates o  Increasing activity/exercise o  Talking with your doctor about reaching your blood sugar goals . High blood sugars (greater than 180 mg/dL) can raise your risk of infections and slow your recovery, so you will need to focus on controlling your diabetes during the weeks before surgery. . Make sure that the doctor who takes care of your diabetes knows about your planned surgery including the date and location.  How do I manage my blood sugar before surgery? . Check your blood sugar at least 4 times a day, starting 2 days before surgery, to make sure that the level is not too high or low. o Check your blood sugar the morning of your surgery when you wake up and every 2 hours until you get to the Short Stay unit. . If your blood  sugar is less than 70 mg/dL, you will need to treat for low blood sugar: o Do not take insulin. o Treat a low blood sugar (less than 70 mg/dL) with  cup of clear juice (cranberry or apple), 4 glucose tablets, OR glucose gel. o Recheck blood sugar in 15 minutes after treatment (to make sure it is greater than 70 mg/dL). If your blood sugar is not greater than 70 mg/dL on recheck, call [phone] for further instructions. . Report your blood sugar to the short stay nurse when you get to Short Stay.  . If you are admitted to the hospital after surgery: o Your blood sugar will be checked by the staff and you will probably be given insulin after surgery (instead of oral diabetes medicines) to make sure you have good blood sugar levels. o The goal for blood sugar control after surgery is 80-180 mg/dL.   WHAT DO I DO ABOUT MY DIABETES MEDICATION?   Marland Kitchen Do not take oral diabetes medicines (pills) the morning of surgery.  . THE NIGHT BEFORE SURGERY,   TAKE _5_ units of __NOVOLIN N_insulin.       . THE MORNING OF SURGERY,  TAKE _5_ units of _NOVOLIN N_ insulin  . The day of surgery, do not take other diabetes injectables, including Byetta (exenatide), Bydureon (exenatide ER), Victoza (liraglutide), or Trulicity (dulaglutide).  . If your CBG is greater than 220 mg/dL, you may take  of your sliding scale (correction) dose of  insulin.  Reviewed and Endorsed by [facility] Patient Education Committee, August 2015   Do not wear jewelry, make-up or nail polish.  Do not wear lotions, powders, or perfumes, or deoderant.  Do not shave 48 hours prior to surgery.  Men may shave face and neck.  Do not bring valuables to the hospital.  [facility] is not responsible for any belongings or valuables.  Contacts, dentures or bridgework may not be worn into surgery.  Leave your suitcase in the car.  After surgery it may be brought to your room.  For patients admitted to the hospital, discharge time will be  determined by your treatment team.  Patients discharged the day of surgery will not be allowed to drive home.   Name and phone number of your driver:    Special instructions:  PREPARING FOR SURGERY  Please read over the following fact sheets that you were given.

## 2016-08-30 LAB — HEMOGLOBIN A1C
HEMOGLOBIN A1C: 7.4 % — AB (ref 4.8–5.6)
MEAN PLASMA GLUCOSE: 166 mg/dL

## 2016-08-31 ENCOUNTER — Inpatient Hospital Stay (HOSPITAL_COMMUNITY): Payer: Medicare HMO | Admitting: Certified Registered Nurse Anesthetist

## 2016-08-31 ENCOUNTER — Inpatient Hospital Stay (HOSPITAL_COMMUNITY): Payer: Medicare HMO

## 2016-08-31 ENCOUNTER — Encounter (HOSPITAL_COMMUNITY): Payer: Self-pay | Admitting: *Deleted

## 2016-08-31 ENCOUNTER — Encounter (HOSPITAL_COMMUNITY)
Admission: RE | Disposition: A | Discharge status: Home / Self Care | Payer: Self-pay | Source: Ambulatory Visit | Attending: Vascular Surgery

## 2016-08-31 ENCOUNTER — Other Ambulatory Visit: Payer: Self-pay | Admitting: *Deleted

## 2016-08-31 ENCOUNTER — Inpatient Hospital Stay (HOSPITAL_COMMUNITY)
Admission: RE | Admit: 2016-08-31 | Discharge: 2016-09-01 | DRG: 269 | Disposition: A | Discharge status: Home / Self Care | Payer: Medicare HMO | Source: Ambulatory Visit | Attending: Vascular Surgery | Admitting: Vascular Surgery

## 2016-08-31 DIAGNOSIS — I5042 Chronic combined systolic (congestive) and diastolic (congestive) heart failure: Secondary | ICD-10-CM | POA: Diagnosis present

## 2016-08-31 DIAGNOSIS — Z96651 Presence of right artificial knee joint: Secondary | ICD-10-CM | POA: Diagnosis present

## 2016-08-31 DIAGNOSIS — E559 Vitamin D deficiency, unspecified: Secondary | ICD-10-CM | POA: Diagnosis present

## 2016-08-31 DIAGNOSIS — Z8673 Personal history of transient ischemic attack (TIA), and cerebral infarction without residual deficits: Secondary | ICD-10-CM | POA: Diagnosis not present

## 2016-08-31 DIAGNOSIS — Z86711 Personal history of pulmonary embolism: Secondary | ICD-10-CM

## 2016-08-31 DIAGNOSIS — I714 Abdominal aortic aneurysm, without rupture, unspecified: Secondary | ICD-10-CM | POA: Diagnosis present

## 2016-08-31 DIAGNOSIS — I110 Hypertensive heart disease with heart failure: Secondary | ICD-10-CM | POA: Diagnosis present

## 2016-08-31 DIAGNOSIS — Z8679 Personal history of other diseases of the circulatory system: Secondary | ICD-10-CM

## 2016-08-31 DIAGNOSIS — Z8249 Family history of ischemic heart disease and other diseases of the circulatory system: Secondary | ICD-10-CM | POA: Diagnosis not present

## 2016-08-31 DIAGNOSIS — Z87891 Personal history of nicotine dependence: Secondary | ICD-10-CM | POA: Diagnosis not present

## 2016-08-31 DIAGNOSIS — Z8711 Personal history of peptic ulcer disease: Secondary | ICD-10-CM | POA: Diagnosis not present

## 2016-08-31 DIAGNOSIS — Z9981 Dependence on supplemental oxygen: Secondary | ICD-10-CM | POA: Diagnosis not present

## 2016-08-31 DIAGNOSIS — Z823 Family history of stroke: Secondary | ICD-10-CM | POA: Diagnosis not present

## 2016-08-31 DIAGNOSIS — Z95828 Presence of other vascular implants and grafts: Secondary | ICD-10-CM

## 2016-08-31 DIAGNOSIS — Z7901 Long term (current) use of anticoagulants: Secondary | ICD-10-CM | POA: Diagnosis not present

## 2016-08-31 DIAGNOSIS — Z794 Long term (current) use of insulin: Secondary | ICD-10-CM | POA: Diagnosis not present

## 2016-08-31 DIAGNOSIS — Z885 Allergy status to narcotic agent status: Secondary | ICD-10-CM

## 2016-08-31 DIAGNOSIS — J449 Chronic obstructive pulmonary disease, unspecified: Secondary | ICD-10-CM | POA: Diagnosis present

## 2016-08-31 DIAGNOSIS — M1990 Unspecified osteoarthritis, unspecified site: Secondary | ICD-10-CM | POA: Diagnosis present

## 2016-08-31 DIAGNOSIS — I4891 Unspecified atrial fibrillation: Secondary | ICD-10-CM | POA: Diagnosis present

## 2016-08-31 DIAGNOSIS — Z880 Allergy status to penicillin: Secondary | ICD-10-CM

## 2016-08-31 DIAGNOSIS — E782 Mixed hyperlipidemia: Secondary | ICD-10-CM | POA: Diagnosis present

## 2016-08-31 DIAGNOSIS — E119 Type 2 diabetes mellitus without complications: Secondary | ICD-10-CM | POA: Diagnosis present

## 2016-08-31 HISTORY — DX: Pneumonia, unspecified organism: J18.9

## 2016-08-31 HISTORY — DX: Unspecified asthma, uncomplicated: J45.909

## 2016-08-31 HISTORY — DX: Type 2 diabetes mellitus without complications: E11.9

## 2016-08-31 HISTORY — DX: Obstructive sleep apnea (adult) (pediatric): G47.33

## 2016-08-31 HISTORY — DX: Other pulmonary embolism without acute cor pulmonale: I26.99

## 2016-08-31 HISTORY — PX: ABDOMINAL AORTIC ANEURYSM REPAIR: SHX42

## 2016-08-31 HISTORY — DX: Dependence on supplemental oxygen: Z99.81

## 2016-08-31 HISTORY — PX: ABDOMINAL AORTIC ENDOVASCULAR STENT GRAFT: SHX5707

## 2016-08-31 HISTORY — DX: Dependence on other enabling machines and devices: Z99.89

## 2016-08-31 LAB — BASIC METABOLIC PANEL
Anion gap: 7 (ref 5–15)
BUN: 17 mg/dL (ref 6–20)
CO2: 26 mmol/L (ref 22–32)
Calcium: 8.6 mg/dL — ABNORMAL LOW (ref 8.9–10.3)
Chloride: 107 mmol/L (ref 101–111)
Creatinine, Ser: 1.15 mg/dL (ref 0.61–1.24)
GFR calc Af Amer: >60 mL/min (ref >60)
GFR calc non Af Amer: >60 mL/min (ref >60)
Glucose, Bld: 105 mg/dL — ABNORMAL HIGH (ref 65–99)
POTASSIUM: 3.5 mmol/L (ref 3.5–5.1)
SODIUM: 140 mmol/L (ref 135–145)

## 2016-08-31 LAB — GLUCOSE, CAPILLARY
GLUCOSE-CAPILLARY: 154 mg/dL — AB (ref 65–99)
GLUCOSE-CAPILLARY: 232 mg/dL — AB (ref 65–99)
Glucose-Capillary: 102 mg/dL — ABNORMAL HIGH (ref 65–99)
Glucose-Capillary: 98 mg/dL (ref 65–99)

## 2016-08-31 LAB — CBC
HCT: 37.7 % — ABNORMAL LOW (ref 39.0–52.0)
Hemoglobin: 12.6 g/dL — ABNORMAL LOW (ref 13.0–17.0)
MCH: 29.8 pg (ref 26.0–34.0)
MCHC: 33.4 g/dL (ref 30.0–36.0)
MCV: 89.1 fL (ref 78.0–100.0)
Platelets: 252 10*3/uL (ref 150–400)
RBC: 4.23 MIL/uL (ref 4.22–5.81)
RDW: 14.8 % (ref 11.5–15.5)
WBC: 11.5 10*3/uL — ABNORMAL HIGH (ref 4.0–10.5)

## 2016-08-31 LAB — MAGNESIUM: MAGNESIUM: 1.9 mg/dL (ref 1.7–2.4)

## 2016-08-31 LAB — PROTIME-INR
INR: 1.09
PROTHROMBIN TIME: 14.2 s (ref 11.4–15.2)

## 2016-08-31 LAB — CREATININE, SERUM
Creatinine, Ser: 1.19 mg/dL (ref 0.61–1.24)
GFR calc non Af Amer: 59 mL/min — ABNORMAL LOW (ref >60)

## 2016-08-31 LAB — APTT: APTT: 33 s (ref 24–36)

## 2016-08-31 SURGERY — INSERTION, ENDOVASCULAR STENT GRAFT, AORTA, ABDOMINAL
Anesthesia: General

## 2016-08-31 MED ORDER — PHENOL 1.4 % MT LIQD: 1.0000 | OROMUCOSAL | Status: DC | PRN

## 2016-08-31 MED ORDER — MORPHINE SULFATE (PF) 2 MG/ML IV SOLN
2.0000 mg | INTRAVENOUS | Status: DC | PRN
  Administered 2016-09-01: 2 mg via INTRAVENOUS
  Filled 2016-08-31: qty 1

## 2016-08-31 MED ORDER — VANCOMYCIN HCL IN DEXTROSE 1-5 GM/200ML-% IV SOLN
1000.0000 mg | Freq: Two times a day (BID) | INTRAVENOUS | Status: AC
  Administered 2016-08-31 – 2016-09-01 (×2): 1000 mg via INTRAVENOUS
  Filled 2016-08-31 (×2): qty 200

## 2016-08-31 MED ORDER — INSULIN ASPART 100 UNIT/ML ~~LOC~~ SOLN
0.0000 [IU] | Freq: Three times a day (TID) | SUBCUTANEOUS | Status: DC
  Administered 2016-08-31 – 2016-09-01 (×2): 3 [IU] via SUBCUTANEOUS

## 2016-08-31 MED ORDER — DOCUSATE SODIUM 100 MG PO CAPS
100.0000 mg | ORAL_CAPSULE | Freq: Every day | ORAL | Status: DC
  Administered 2016-09-01: 100 mg via ORAL
  Filled 2016-08-31: qty 1

## 2016-08-31 MED ORDER — LIRAGLUTIDE 18 MG/3ML ~~LOC~~ SOPN: 18.0000 mg | PEN_INJECTOR | Freq: Every day | SUBCUTANEOUS | Status: DC

## 2016-08-31 MED ORDER — VANCOMYCIN HCL 10 G IV SOLR
1500.0000 mg | INTRAVENOUS | Status: AC
  Administered 2016-08-31: 1500 mg via INTRAVENOUS
  Filled 2016-08-31: qty 1500

## 2016-08-31 MED ORDER — SUGAMMADEX SODIUM 200 MG/2ML IV SOLN
INTRAVENOUS | Status: AC
  Filled 2016-08-31: qty 2

## 2016-08-31 MED ORDER — FAMOTIDINE 20 MG PO TABS
20.0000 mg | ORAL_TABLET | Freq: Two times a day (BID) | ORAL | Status: DC
  Administered 2016-08-31 – 2016-09-01 (×2): 20 mg via ORAL
  Filled 2016-08-31 (×2): qty 1

## 2016-08-31 MED ORDER — MIDAZOLAM HCL 5 MG/5ML IJ SOLN
INTRAMUSCULAR | Status: DC | PRN
  Administered 2016-08-31 (×2): 1 mg via INTRAVENOUS

## 2016-08-31 MED ORDER — BISACODYL 10 MG RE SUPP: 10.0000 mg | Freq: Every day | RECTAL | Status: DC | PRN

## 2016-08-31 MED ORDER — SODIUM CHLORIDE 0.9 % IV SOLN: INTRAVENOUS | Status: DC

## 2016-08-31 MED ORDER — METOPROLOL TARTRATE 5 MG/5ML IV SOLN: 2.0000 mg | INTRAVENOUS | Status: DC | PRN

## 2016-08-31 MED ORDER — DEXTROSE 5 % IV SOLN
INTRAVENOUS | Status: DC | PRN
  Administered 2016-08-31: 25 ug/min via INTRAVENOUS

## 2016-08-31 MED ORDER — ACETAMINOPHEN 325 MG PO TABS: 325.0000 mg | ORAL_TABLET | ORAL | Status: DC | PRN

## 2016-08-31 MED ORDER — PANTOPRAZOLE SODIUM 40 MG PO TBEC
40.0000 mg | DELAYED_RELEASE_TABLET | Freq: Every day | ORAL | Status: DC
  Administered 2016-08-31 – 2016-09-01 (×2): 40 mg via ORAL
  Filled 2016-08-31 (×2): qty 1

## 2016-08-31 MED ORDER — SUGAMMADEX SODIUM 200 MG/2ML IV SOLN
INTRAVENOUS | Status: DC | PRN
  Administered 2016-08-31: 200 mg via INTRAVENOUS

## 2016-08-31 MED ORDER — LABETALOL HCL 5 MG/ML IV SOLN
INTRAVENOUS | Status: AC
  Administered 2016-08-31: 10 mg
  Filled 2016-08-31: qty 4

## 2016-08-31 MED ORDER — LACTATED RINGERS IV SOLN
INTRAVENOUS | Status: DC | PRN
  Administered 2016-08-31: 07:00:00 via INTRAVENOUS

## 2016-08-31 MED ORDER — PROPOFOL 10 MG/ML IV BOLUS
INTRAVENOUS | Status: DC | PRN
Start: 2016-08-31 — End: 2016-08-31
  Administered 2016-08-31: 170 mg via INTRAVENOUS
  Administered 2016-08-31: 30 mg via INTRAVENOUS

## 2016-08-31 MED ORDER — LACTATED RINGERS IV SOLN
INTRAVENOUS | Status: DC | PRN
  Administered 2016-08-31 (×2): via INTRAVENOUS

## 2016-08-31 MED ORDER — SODIUM CHLORIDE 0.9 % IV SOLN
INTRAVENOUS | Status: DC | PRN
  Administered 2016-08-31 (×2): 500 mL

## 2016-08-31 MED ORDER — ROCURONIUM BROMIDE 100 MG/10ML IV SOLN
INTRAVENOUS | Status: DC | PRN
  Administered 2016-08-31: 60 mg via INTRAVENOUS
  Administered 2016-08-31 (×2): 10 mg via INTRAVENOUS

## 2016-08-31 MED ORDER — HEPARIN SODIUM (PORCINE) 1000 UNIT/ML IJ SOLN
INTRAMUSCULAR | Status: AC
  Filled 2016-08-31: qty 1

## 2016-08-31 MED ORDER — MIDAZOLAM HCL 2 MG/2ML IJ SOLN
INTRAMUSCULAR | Status: AC
  Filled 2016-08-31: qty 2

## 2016-08-31 MED ORDER — METOPROLOL TARTRATE 25 MG PO TABS
25.0000 mg | ORAL_TABLET | Freq: Two times a day (BID) | ORAL | Status: DC
  Administered 2016-08-31 – 2016-09-01 (×2): 25 mg via ORAL
  Filled 2016-08-31 (×2): qty 1

## 2016-08-31 MED ORDER — CHLORHEXIDINE GLUCONATE CLOTH 2 % EX PADS: 6.0000 | MEDICATED_PAD | Freq: Once | CUTANEOUS | Status: DC

## 2016-08-31 MED ORDER — ACETAMINOPHEN 325 MG RE SUPP
325.0000 mg | RECTAL | Status: DC | PRN
Start: 2016-08-31 — End: 2016-09-01

## 2016-08-31 MED ORDER — ROCURONIUM BROMIDE 10 MG/ML (PF) SYRINGE
PREFILLED_SYRINGE | INTRAVENOUS | Status: AC
  Filled 2016-08-31: qty 10

## 2016-08-31 MED ORDER — LIDOCAINE 2% (20 MG/ML) 5 ML SYRINGE
INTRAMUSCULAR | Status: AC
  Filled 2016-08-31: qty 5

## 2016-08-31 MED ORDER — ONDANSETRON HCL 4 MG/2ML IJ SOLN
INTRAMUSCULAR | Status: DC | PRN
  Administered 2016-08-31: 4 mg via INTRAVENOUS

## 2016-08-31 MED ORDER — FENTANYL CITRATE (PF) 250 MCG/5ML IJ SOLN
INTRAMUSCULAR | Status: AC
  Filled 2016-08-31: qty 5

## 2016-08-31 MED ORDER — ONDANSETRON HCL 4 MG/2ML IJ SOLN: 4.0000 mg | Freq: Four times a day (QID) | INTRAMUSCULAR | Status: DC | PRN

## 2016-08-31 MED ORDER — HYDRALAZINE HCL 20 MG/ML IJ SOLN
INTRAMUSCULAR | Status: AC
  Filled 2016-08-31: qty 1

## 2016-08-31 MED ORDER — POTASSIUM CHLORIDE CRYS ER 20 MEQ PO TBCR
20.0000 meq | EXTENDED_RELEASE_TABLET | Freq: Every day | ORAL | Status: AC | PRN
  Administered 2016-09-01: 40 meq via ORAL
  Filled 2016-08-31: qty 2

## 2016-08-31 MED ORDER — IODIXANOL 320 MG/ML IV SOLN
INTRAVENOUS | Status: DC | PRN
  Administered 2016-08-31: 150 mL via INTRAVENOUS

## 2016-08-31 MED ORDER — PHENYLEPHRINE HCL 10 MG/ML IJ SOLN
INTRAMUSCULAR | Status: DC | PRN
  Administered 2016-08-31: 80 ug via INTRAVENOUS
  Administered 2016-08-31 (×2): 40 ug via INTRAVENOUS

## 2016-08-31 MED ORDER — ALUM & MAG HYDROXIDE-SIMETH 200-200-20 MG/5ML PO SUSP: 15.0000 mL | ORAL | Status: DC | PRN

## 2016-08-31 MED ORDER — GUAIFENESIN-DM 100-10 MG/5ML PO SYRP: 15.0000 mL | ORAL_SOLUTION | ORAL | Status: DC | PRN

## 2016-08-31 MED ORDER — HEPARIN SODIUM (PORCINE) 1000 UNIT/ML IJ SOLN
INTRAMUSCULAR | Status: DC | PRN
  Administered 2016-08-31: 10000 [IU] via INTRAVENOUS
  Administered 2016-08-31: 2000 [IU] via INTRAVENOUS

## 2016-08-31 MED ORDER — ORAL CARE MOUTH RINSE
15.0000 mL | Freq: Two times a day (BID) | OROMUCOSAL | Status: DC
  Administered 2016-08-31: 15 mL via OROMUCOSAL

## 2016-08-31 MED ORDER — MAGNESIUM SULFATE 2 GM/50ML IV SOLN: 2.0000 g | Freq: Every day | INTRAVENOUS | Status: DC | PRN

## 2016-08-31 MED ORDER — ONDANSETRON HCL 4 MG/2ML IJ SOLN
INTRAMUSCULAR | Status: AC
  Filled 2016-08-31: qty 2

## 2016-08-31 MED ORDER — LABETALOL HCL 5 MG/ML IV SOLN
10.0000 mg | INTRAVENOUS | Status: DC | PRN
  Administered 2016-08-31 (×2): 10 mg via INTRAVENOUS
  Filled 2016-08-31: qty 4

## 2016-08-31 MED ORDER — DILTIAZEM HCL ER COATED BEADS 180 MG PO CP24
180.0000 mg | ORAL_CAPSULE | Freq: Every day | ORAL | Status: DC
  Administered 2016-09-01: 180 mg via ORAL
  Filled 2016-08-31: qty 1

## 2016-08-31 MED ORDER — VANCOMYCIN HCL IN DEXTROSE 1-5 GM/200ML-% IV SOLN
INTRAVENOUS | Status: AC
  Filled 2016-08-31: qty 200

## 2016-08-31 MED ORDER — SENNOSIDES-DOCUSATE SODIUM 8.6-50 MG PO TABS: 1.0000 | ORAL_TABLET | Freq: Every evening | ORAL | Status: DC | PRN

## 2016-08-31 MED ORDER — ENOXAPARIN SODIUM 30 MG/0.3ML ~~LOC~~ SOLN: 30.0000 mg | SUBCUTANEOUS | Status: DC

## 2016-08-31 MED ORDER — FENTANYL CITRATE (PF) 100 MCG/2ML IJ SOLN
INTRAMUSCULAR | Status: DC | PRN
  Administered 2016-08-31: 150 ug via INTRAVENOUS
  Administered 2016-08-31 (×4): 50 ug via INTRAVENOUS

## 2016-08-31 MED ORDER — ALBUTEROL SULFATE (2.5 MG/3ML) 0.083% IN NEBU: 3.0000 mL | INHALATION_SOLUTION | RESPIRATORY_TRACT | Status: DC | PRN

## 2016-08-31 MED ORDER — OXYCODONE-ACETAMINOPHEN 5-325 MG PO TABS
1.0000 | ORAL_TABLET | ORAL | Status: DC | PRN
  Filled 2016-08-31: qty 1

## 2016-08-31 MED ORDER — PROPOFOL 10 MG/ML IV BOLUS
INTRAVENOUS | Status: AC
  Filled 2016-08-31: qty 20

## 2016-08-31 MED ORDER — HYDRALAZINE HCL 20 MG/ML IJ SOLN
5.0000 mg | INTRAMUSCULAR | Status: AC | PRN
Start: 2016-08-31 — End: 2016-09-01
  Administered 2016-08-31 – 2016-09-01 (×2): 5 mg via INTRAVENOUS
  Filled 2016-08-31: qty 1

## 2016-08-31 MED ORDER — PROTAMINE SULFATE 10 MG/ML IV SOLN
INTRAVENOUS | Status: DC | PRN
  Administered 2016-08-31 (×2): 20 mg via INTRAVENOUS
  Administered 2016-08-31: 10 mg via INTRAVENOUS

## 2016-08-31 MED ORDER — DONEPEZIL HCL 10 MG PO TABS
10.0000 mg | ORAL_TABLET | Freq: Every day | ORAL | Status: DC
Start: 2016-08-31 — End: 2016-09-01
  Administered 2016-08-31: 10 mg via ORAL
  Filled 2016-08-31: qty 1

## 2016-08-31 MED ORDER — FUROSEMIDE 40 MG PO TABS
40.0000 mg | ORAL_TABLET | Freq: Two times a day (BID) | ORAL | Status: DC
  Administered 2016-08-31 – 2016-09-01 (×2): 40 mg via ORAL
  Filled 2016-08-31 (×2): qty 1

## 2016-08-31 MED ORDER — SODIUM CHLORIDE 0.9 % IV SOLN
INTRAVENOUS | Status: DC
  Administered 2016-08-31: 15:00:00 via INTRAVENOUS

## 2016-08-31 MED ORDER — 0.9 % SODIUM CHLORIDE (POUR BTL) OPTIME
TOPICAL | Status: DC | PRN
  Administered 2016-08-31: 1000 mL

## 2016-08-31 MED ORDER — SODIUM CHLORIDE 0.9 % IV SOLN: 500.0000 mL | Freq: Once | INTRAVENOUS | Status: DC | PRN

## 2016-08-31 MED ORDER — LIDOCAINE HCL (CARDIAC) 20 MG/ML IV SOLN
INTRAVENOUS | Status: DC | PRN
  Administered 2016-08-31: 60 mg via INTRAVENOUS

## 2016-08-31 SURGICAL SUPPLY — 77 items
BAG DECANTER FOR FLEXI CONT (MISCELLANEOUS) ×3 IMPLANT
BLADE SURG CLIPPER 3M 9600 (MISCELLANEOUS) ×3 IMPLANT
CANISTER SUCTION 2500CC (MISCELLANEOUS) ×3 IMPLANT
CATH ACCU-VU SIZ PIG 5F 70CM (CATHETERS) IMPLANT
CATH ANGIO 5F BER2 65CM (CATHETERS) ×3 IMPLANT
CATH BEACON 5 .035 65 C1 TIP (CATHETERS) ×3 IMPLANT
CATH BEACON 5.038 65CM KMP-01 (CATHETERS) IMPLANT
CATH HEADHUNTER H1 5F 100CM (CATHETERS) ×3 IMPLANT
CATH OMNI FLUSH .035X70CM (CATHETERS) ×3 IMPLANT
CATH VANSCH 5FR 6CM (CATHETERS) ×3 IMPLANT
COVER PROBE W GEL 5X96 (DRAPES) IMPLANT
DERMABOND ADVANCED (GAUZE/BANDAGES/DRESSINGS) ×2
DERMABOND ADVANCED .7 DNX12 (GAUZE/BANDAGES/DRESSINGS) ×1 IMPLANT
DEVICE CLOSURE PERCLS PRGLD 6F (VASCULAR PRODUCTS) ×3 IMPLANT
DEVICE TORQUE KENDALL .025-038 (MISCELLANEOUS) ×3 IMPLANT
DRSG TEGADERM 2-3/8X2-3/4 SM (GAUZE/BANDAGES/DRESSINGS) ×3 IMPLANT
DRYSEAL FLEXSHEATH 12FR 33CM (SHEATH) ×2
DRYSEAL FLEXSHEATH 18FR 33CM (SHEATH) ×2
ELECT BLADE 4.0 EZ CLEAN MEGAD (MISCELLANEOUS) ×3
ELECT REM PT RETURN 9FT ADLT (ELECTROSURGICAL) ×6
ELECTRODE BLDE 4.0 EZ CLN MEGD (MISCELLANEOUS) ×1 IMPLANT
ELECTRODE REM PT RTRN 9FT ADLT (ELECTROSURGICAL) ×2 IMPLANT
EXCLUDER TNK LEG 31MX14X17 (Endovascular Graft) ×1 IMPLANT
EXCLUDER TRUNK LEG 31MX14X17 (Endovascular Graft) ×3 IMPLANT
GAUZE SPONGE 2X2 8PLY STRL LF (GAUZE/BANDAGES/DRESSINGS) ×1 IMPLANT
GLOVE BIO SURGEON STRL SZ7 (GLOVE) ×3 IMPLANT
GLOVE BIO SURGEON STRL SZ7.5 (GLOVE) ×6 IMPLANT
GLOVE BIOGEL PI IND STRL 7.5 (GLOVE) ×4 IMPLANT
GLOVE BIOGEL PI IND STRL 8 (GLOVE) ×2 IMPLANT
GLOVE BIOGEL PI INDICATOR 7.5 (GLOVE) ×8
GLOVE BIOGEL PI INDICATOR 8 (GLOVE) ×4
GOWN STRL REUS W/ TWL LRG LVL3 (GOWN DISPOSABLE) ×3 IMPLANT
GOWN STRL REUS W/TWL LRG LVL3 (GOWN DISPOSABLE) ×6
GRAFT BALLN CATH 65CM (STENTS) ×1 IMPLANT
GUIDEWIRE ANGLED .035X150CM (WIRE) ×3 IMPLANT
KIT BASIN OR (CUSTOM PROCEDURE TRAY) ×3 IMPLANT
KIT ROOM TURNOVER OR (KITS) ×3 IMPLANT
LEG CONTRALATERAL 16X14.5X12 (Vascular Products) ×3 IMPLANT
LOOP VESSEL MAXI BLUE (MISCELLANEOUS) ×6 IMPLANT
LOOP VESSEL MINI RED (MISCELLANEOUS) ×3 IMPLANT
NEEDLE PERC 18GX7CM (NEEDLE) ×3 IMPLANT
NS IRRIG 1000ML POUR BTL (IV SOLUTION) ×3 IMPLANT
PACK ENDOVASCULAR (PACKS) ×3 IMPLANT
PAD ARMBOARD 7.5X6 YLW CONV (MISCELLANEOUS) ×6 IMPLANT
PENCIL BUTTON HOLSTER BLD 10FT (ELECTRODE) ×3 IMPLANT
PERCLOSE PROGLIDE 6F (VASCULAR PRODUCTS) ×9
SHEATH AVANTI 11CM 8FR (MISCELLANEOUS) ×3 IMPLANT
SHEATH BRITE TIP 8FR 23CM (MISCELLANEOUS) ×3 IMPLANT
SHEATH DRYSEAL FLEX 12FR 33CM (SHEATH) ×1 IMPLANT
SHEATH DRYSEAL FLEX 18FR 33CM (SHEATH) ×1 IMPLANT
SPONGE GAUZE 2X2 STER 10/PKG (GAUZE/BANDAGES/DRESSINGS) ×2
SPONGE GAUZE 4X4 12PLY STER LF (GAUZE/BANDAGES/DRESSINGS) ×3 IMPLANT
STAPLER VISISTAT (STAPLE) IMPLANT
STENT GRAFT BALLN CATH 65CM (STENTS) ×2
STOPCOCK MORSE 400PSI 3WAY (MISCELLANEOUS) ×6 IMPLANT
SUT PROLENE 5 0 C 1 24 (SUTURE) ×9 IMPLANT
SUT PROLENE 6 0 CC (SUTURE) ×3 IMPLANT
SUT SILK 2 0 TIES 17X18 (SUTURE) ×2
SUT SILK 2-0 18XBRD TIE BLK (SUTURE) ×1 IMPLANT
SUT SILK 3 0 TIES 17X18 (SUTURE) ×2
SUT SILK 3-0 18XBRD TIE BLK (SUTURE) ×1 IMPLANT
SUT SILK 4 0 TIES 17X18 (SUTURE) ×3 IMPLANT
SUT VIC AB 2-0 CT1 27 (SUTURE) ×2
SUT VIC AB 2-0 CT1 TAPERPNT 27 (SUTURE) ×1 IMPLANT
SUT VIC AB 3-0 SH 27 (SUTURE) ×2
SUT VIC AB 3-0 SH 27X BRD (SUTURE) ×1 IMPLANT
SUT VICRYL 4-0 PS2 18IN ABS (SUTURE) ×6 IMPLANT
SYR 30ML LL (SYRINGE) ×3 IMPLANT
SYR BULB IRRIGATION 50ML (SYRINGE) ×3 IMPLANT
TAPE CLOTH SURG 4X10 WHT LF (GAUZE/BANDAGES/DRESSINGS) ×3 IMPLANT
TRAY FOLEY BAG SILVER LF 14FR (CATHETERS) ×3 IMPLANT
TRAY FOLEY W/METER SILVER 16FR (SET/KITS/TRAYS/PACK) ×3 IMPLANT
TUBING HIGH PRESSURE 120CM (CONNECTOR) ×6 IMPLANT
WATER STERILE IRR 1000ML POUR (IV SOLUTION) ×3 IMPLANT
WIRE AMPLATZ SS-J .035X180CM (WIRE) ×3 IMPLANT
WIRE BENTSON .035X145CM (WIRE) ×3 IMPLANT
YANKAUER SUCT BULB TIP NO VENT (SUCTIONS) ×3 IMPLANT

## 2016-08-31 NOTE — H&P (View-Only) (Signed)
Patient name: Rodney Orozco MRN: [id number] DOB: Mar 02, 1941 Sex: male  REASON FOR VISIT: Follow up of abdominal aortic aneurysm.  HPI: Rodney Orozco is a 75 y.o. male, who I saw in consultation on 08/03/2016 with a 5.7 cm infrarenal abdominal aortic aneurysm. He was hospitalized in February of this year and underwent a CT of the chest to rule out a pulmonary embolus. An incidental finding was his abdominal aortic aneurysm. His scan was done elsewhere. He had a known small abdominal aortic aneurysm and was being followed elsewhere but was then lost to follow up.  He has multiple complicating factors.  He's had previous abdominal surgery for peptic ulcer disease. He has a greater than 100-pack-year history of smoking. He did quit 5 years ago. He does not tolerate statins. He has COPD and also a history of systolic and diastolic heart failure. He is on Coumadin because during his last admission he had atrial fibrillation with a rapid ventricular response and he had a pulmonary embolus in February 2016 after a right total knee replacement.  Past Medical History:  Diagnosis Date  . A-fib ([place])   . AAA (abdominal aortic aneurysm) ([place])   . Atrial fibrillation with RVR ([place])   . Atypical psychosis   . CHF (congestive heart failure) ([place])   . Diabetes mellitus without complication ([place])   . Dyslipidemia   . Hypertension   . Mixed hyperlipidemia   . Osteoarthritis   . Pneumonia December 2014   Had hemoptysis and admitted at [facility].  . Stroke [facility])   . Vitamin D deficiency     Family History  Problem Relation Age of Onset  . Dementia Mother   . Stroke Mother   . Heart attack Father     SOCIAL HISTORY: Social History   Social History  . Marital status: Married    Spouse name: [name]  . Number of children: 3  . Years of education: 22   Occupational History  . Retired    Social History Main Topics  . Smoking status: Former Smoker    Packs/day: 2.00    Years: 50.00     Types: Cigarettes    Quit date: 11/07/2013  . Smokeless tobacco: Never Used  . Alcohol use No  . Drug use: No  . Sexual activity: Not on file   Other Topics Concern  . Not on file   Social History Narrative   Lives with wife   Caffeine use: 4 cups /day (coffee)    Allergies  Allergen Reactions  . Codeine Other (See Comments)    Hallucinations  . Tramadol Dermatitis  . Lipitor [Atorvastatin] Other (See Comments)    Joint pain  . Penicillins Rash    HIves/rash    Current Outpatient Prescriptions  Medication Sig Dispense Refill  . albuterol (PROVENTIL) (2.5 MG/3ML) 0.083% nebulizer solution Inhale 3 mLs into the lungs as needed for shortness of breath.    . diltiazem (CARDIZEM CD) 180 MG 24 hr capsule Take 1 capsule (180 mg total) by mouth daily. 90 capsule 3  . donepezil (ARICEPT) 10 MG tablet Take 1 tablet (10 mg total) by mouth at bedtime. 30 tablet 12  . famotidine (PEPCID) 20 MG tablet Take 1 tablet by mouth 2 (two) times daily.    . furosemide (LASIX) 40 MG tablet Take 1 tablet by mouth 2 (two) times daily.    Marland Kitchen losartan (COZAAR) 100 MG tablet Take 100 mg by mouth daily.    . metFORMIN (  GLUCOPHAGE-XR) 500 MG 24 hr tablet Take 1,000 mg by mouth 2 (two) times daily.    . metoprolol tartrate (LOPRESSOR) 25 MG tablet Take 1 tablet by mouth 2 (two) times daily.    Marland Kitchen NOVOLIN N RELION 100 UNIT/ML injection Inject 35 Units into the skin 2 (two) times daily.     . OXYGEN 2lpm 24/7  DME- [facility]    . VICTOZA 18 MG/3ML SOPN Inject 1.2 mg into the skin daily.    Marland Kitchen warfarin (COUMADIN) 5 MG tablet Take 1.5 tablets by mouth daily. For 14 days. Being checked again 07/13/16     No current facility-administered medications for this visit.     REVIEW OF SYSTEMS:  '[X]'$  denotes positive finding, '[ ]'$  denotes negative finding Cardiac  Comments:  Chest pain or chest pressure:    Shortness of breath upon exertion: X   Short of breath when lying flat:    Irregular heart rhythm: X History  of atrial fibrillation.       Vascular    Pain in calf, thigh, or hip brought on by ambulation:    Pain in feet at night that wakes you up from your sleep:     Blood clot in your veins:    Leg swelling:         Pulmonary    Oxygen at home: X   Productive cough:     Wheezing:         Neurologic    Sudden weakness in arms or legs:     Sudden numbness in arms or legs:     Sudden onset of difficulty speaking or slurred speech:    Temporary loss of vision in one eye:     Problems with dizziness:         Gastrointestinal    Blood in stool:     Vomited blood:         Genitourinary    Burning when urinating:     Blood in urine:        Psychiatric    Major depression:         Hematologic    Bleeding problems:    Problems with blood clotting too easily:        Skin    Rashes or ulcers:        Constitutional    Fever or chills:      PHYSICAL EXAM: Vitals:   08/23/16 1104 08/23/16 1106  BP: (!) 158/103 (!) 161/93  Pulse: 73   Resp: 16   Temp: 97.6 F (36.4 C)   TempSrc: Oral   SpO2: 92%   Weight: 236 lb (107 kg)   Height: 5' 10.5" (1.791 m)     GENERAL: The patient is a well-nourished male, in no acute distress. The vital signs are documented above. CARDIAC: There is a regular rate and rhythm.  VASCULAR: I do not detect carotid bruits. On the right side, he has a palpable femoral, popliteal, and posterior tibial pulse. On the left side, he has a palpable femoral pulse and posterior tibial pulse. He does not have any significant lower extremity swelling. PULMONARY: There is good air exchange bilaterally without wheezing or rales. ABDOMEN: Soft and non-tender with normal pitched bowel sounds.  MUSCULOSKELETAL: There are no major deformities or cyanosis. NEUROLOGIC: No focal weakness or paresthesias are detected. SKIN: There are no ulcers or rashes noted. PSYCHIATRIC: The patient has a normal affect.  DATA:   CT ANGIOGRAM ABDOMEN PELVIS: I have reviewed his  CT  angiogram of the abdomen and pelvis. The maximum diameter of the aneurysm is 6.1 cm. The neck is somewhat short he does have some iliac disease however he appears to be an acceptable candidate for an endovascular aneurysm repair. (L- [id number] cm; [id number])  MEDICAL ISSUES:  6.1 CM INFRARENAL ABDOMINAL AORTIC ANEURYSM:  Given the size of his aneurysm, the risk of rupture is a proximally in the 15% per year. I would therefore recommend elective repair. He would be very high-risk for open repair given his previous abdominal surgery for peptic ulcer disease, obesity, COPD on home oxygen, and significant cardiac history. Fortunately, he does appear to be a reasonable candidate for endovascular aneurysm repair although the neck is somewhat short. He also has some iliac disease.  His myocardial perfusion scan was intermediate risk due to his reduced systolic function. His left ventricular ejection fraction was moderately decreased at 30-45%. There was no evidence of ischemia. We will check with Dr. Jenness [place] office to be sure that he is cleared from a cardiac standpoint.  I have discussed the indications for aneurysm repair. I have explained that without repair, the risk of the aneurysm rupturing is 10-15% per year. We have discussed the advantages and disadvantages of open versus endovascular repair. The patient wishes to proceed with endovascular aneurysm repair (EVAR). I have discussed the potential complications of EVAR, including, but not limited to: bleeding, infection, arterial injury, graft migration, endoleak, renal failure, MI or other unpredictable medical problems. We have discussed the possibility of having to convert to open repair. We also discussed the need for continued lifelong follow-up after EVAR. All of the patients questions were answered and they are agreeable to proceed with surgery.   Deitra Mayo [facility] [place] [phone]

## 2016-08-31 NOTE — Interval H&P Note (Signed)
History and Physical Interval Note:  08/31/2016 7:24 AM  Rodney Orozco  has presented today for surgery, with the diagnosis of Abdominal aortic aneurysm I71.4  The various methods of treatment have been discussed with the patient and family. After consideration of risks, benefits and other options for treatment, the patient has consented to  Procedure(s): ABDOMINAL AORTIC ENDOVASCULAR STENT GRAFT (N/A) as a surgical intervention .  The patient's history has been reviewed, patient examined, no change in status, stable for surgery.  I have reviewed the patient's chart and labs.  Questions were answered to the patient's satisfaction.     Deitra Mayo

## 2016-08-31 NOTE — Progress Notes (Signed)
3 S unable to take report-will call back

## 2016-08-31 NOTE — Progress Notes (Signed)
Admission history being completed by admission RN and pt stated he had some blurry vision in his left eye that was worse than the right eye. This is a change from previous assessment. Upon further questioning, pt stated it felt more like irritation and the constant watering was making it appear blurry. Neuro check remains WNL and unchanged otherwise. Approx 15 minutes later, Dr. Scot Dock arrived to unit and completed another neuro check. Stated he was not concerned about this and to continue to monitor. Also stated he was ok with pt's SBP staying around 170, versus 160 (per prn orders).  Joellen Jersey, RN

## 2016-08-31 NOTE — Op Note (Signed)
OPERATIVE NOTE   PROCEDURE: 1. Right common femoral artery cannulation under ultrasound guidance 2. "Preclose" repair of right common femoral artery  3. Placement of catheter in aorta  4. Placement of left iliac limb (14 mm x 12 cm)  (Dictated by Dr. Scot Dock) 5.   Left common femoral artery open exposure and repair 6. Placement of catheter in aorta  7. Aortogram 8. Placement of bifurcated aortic endograft (31 mm x 14 mm x 17 cm)  9. Radiologic S&I   PRE-OPERATIVE DIAGNOSIS: asymptomatic large abdominal aortic aneurysm,  POST-OPERATIVE DIAGNOSIS: same as above   CO-SURGEONS: Gae Gallop, MD; Adele Barthel, MD  ANESTHESIA: general  ESTIMATED BLOOD LOSS: 50 cc  FINDING(S): 1. Successful exclusion of abdominal aortic aneurysm with preservation of bilateral renal arteries  2. Patent bilateral internal iliac arteries 3. Small type II endoleak   SPECIMEN(S): none  INDICATIONS:  Rodney Orozco is a 75 y.o. (1941-09-26) male  who presents with large asymptomatic abdominal aortic aneurysm. The patient is aware the risks of endovascular aortic surgery include but are not limited to: bleeding, need for transfusion, infection, death, stroke, paralysis, wound complications, bowel ischemia, pelvic ischemia, extended ventilation, anaphylactic reaction to contrast, contrast induced nephropathy, embolism, and need for additional procedure to address endoleaks. Overall, Dr. Scot Dock cited a mortality rate of 1-2% and morbidity rate of 15%.  DESCRIPTION: After obtaining full informed written consent, the patient was brought back to the operating room and placed supine upon the operating table. The patient received IV antibiotics prior to induction. After obtaining adequate anesthesia, the patient was prepped and draped in the standard fashion for: open and endovascular aortic repair.  Dr. Scot Dock attempted to performed the "preclose" technique on the left common  femoral artery but the Proglide devices x 2 failed to obtain any blood flashback.  He elected to perform a femoral cutdown to gain access.  Please see his notes for the detail.    At this point, I turned my attention to the right groin. Under Sonosite guidance, I cannulated the right common femoral artery with a 18 gauge needle. I passed a Bentson wire up the right common femoral arteryinto aorta.  The subcutaneous tract was dilated with a 8-Fr dilator. The first Proglide device was loaded over the wire into the artery, the wire was removed and the device advanced until a flash of blood was obtained through the side port. I deployed the device with 30 degrees medial rotation. The Proglide sutures were removed and tagged. The Bentson wire was reloaded through the used Proglide device. I loaded another Proglide over the wire into the right common iliac artery artery. The device was deployed in a similar fashion, this time at 30 degree lateral rotation. A 8-Fr long sheath was loaded into the distal aorta under fluoroscopic guidance. I exchanged the left wire for an Amplatz wire. The left femoral sheath was exchanged for a 12-Fr Dryseal sheath.   At this point, Dr. Scot Dock placed a 18-Fr Dryseal sheath via the left common femoral artery. I loaded the pigtail catheter over the right wire into the suprarenal aorta. The wire was removed and the catheter connected to the power injector in the suprarenal position..   Dr. Scot Dock then loaded the main body through the left sheath: C3 31 mm x 14 mm x 17 cm in the infrarenal position. A power injector aortogram was completed to identify the location of the right renal artery which was the lowest renal artery. He deployed the main  body main body in the infrarenal position. The graft dropped almost 2 mm, so it was reconstrained and repositioned more superiorly and then redeployed.  Completion aortogram demonstrated good position of the main body  without any coverage of the renal arteries.   At this point, I replaced the Amplatz in the pigtail catheter and removed the catheter. I then placed a buddy wire, Glidewire wire through the left femoral sheath with a 8-Fr dilator. I had to try multiple catheters to eventually cannulate the contralateral gate using a H1 and Glidewire.  I advanced the catheter and wire into the suprarenal aorta. The Amplatz wire was removed and used to replace the Glidewire. The catheter was exchanged for a pigtail catheter. The wire was pulled down and the pigtail catheter was rotated at the top of the endograft to demonstrated intragraft position of the catheter. The Amplatz wire was replaced in this catheter and the catheter pulled down to the flow divider.   A retrograde injection from the right femoral sheath demonstrated the position of the right internal iliac artery. Based on the length measurements, 14 mm x 12 cm contralateral limb was selected. I positioned this limb extension with adequate overlap and deployed the limb. The stent delivery device was removed.   At this point, Dr. Scot Dock performed a retrograde injection on the left side to confirm that the left internal iliac artery position. There appeared to be adequate common iliac artery length to seal this graft.  He fully released the ipsilateral limb and detached the main body. The stent delivery device was removed.    At this point, I passed the Q50 balloon up the right sheath. I inflated the molding balloon at the proximal aortic endograft, at the flow divider, and at the end of the right iliac limb. The balloon was deflated and placed on the right wire. The balloon was deflated and placed on the left wire.  This inflated to remold the proximal aortic endograft and the entire ipslateral limb.    A pigtail catheter was loaded on the right side. A completion aortogram was completed. Based on the images, there appeared to be a small  Type 2 endoleak due to possible junction leak at the contralateral limb.  Also the proximal endograft appeared to inadequately apposed.  The balloon was again placed on the right side.  The Q50 was inflated at the proximal aspect of the endograft and in the contralateral limb overlap.  The completion demonstrated widely patent bilateral renal and internal iliac arteries, near resolution of inadequate proximal wall apposition and resolution of junction leak.  There appeared to be a delayed Type 2 endoleak near this area.  At this point,I placed a Bentson wire in the right pigtail catheter and removed the catheter. I held pressure on the right common femoral artery, while Dr. Scot Dock removed the right sheath. I then completed the repair of the right common femoral artery with the previously placed Proglide sutures. Tension was applied to the Proglide sutures with a hemostat.  I repaired the right groin stab incision with a U-stitch of 4-0 Vicryl. The skin was cleaned, dried, and reinforced with Dermabond.  Dr. Scot Dock then repaired the left common femoral artery after removing the sheath.  At this point, the patient was given Protamine to reverse anticoagulation.  See Dr. Nicole Cella notes for these details.    COMPLICATIONS: none  CONDITION: stable   Adele Barthel, MD, [facility] [place] Office: [phone] Pager: [phone]  08/31/2016, 10:28  AM

## 2016-08-31 NOTE — Op Note (Signed)
NAME: Rodney Orozco   MRN: [id number] DOB: 27-Mar-1941    DATE OF OPERATION: 08/31/2016  PREOP DIAGNOSIS: 6.1 cm infrarenal abdominal aortic aneurysm  POSTOP DIAGNOSIS:  same  PROCEDURE:   1. Exposure of left common femoral artery 2. Endovascular repair of abdominal aortic aneurysm using 2 components  CO-SURGEONS: Judeth Cornfield. Scot Dock, MD, FACS, Adele Barthel, MD, FACS  ANESTHESIA:  Gen.   EBL:  per anesthesia record  INDICATIONS: Rodney Orozco is a 75 y.o. male  who underwent a CT scan of the chest to rule out a pulmonary embolus. An incidental finding was an abdominal aortic aneurysm. The patient is on Coumadin as during a previous admission he had atrial fibrillation with a rapid ventricular response and had a pulmonary embolus in February 2016 after a total knee replacement.  FINDINGS:  completion arteriogram showed no evidence of type I or type II endoleak.  TECHNIQUE:  The patient was taken to the operating room after monitoring lines were placed by anesthesia. The groins and abdomen were prepped and draped in usual sterile fashion. Under ultrasound guidance, the left common femoral artery was cannulated. A guidewire was introduced without difficulty and the tract dilated with a 5 [place] dilator. However I was unable to pass the Perclose device. There was significant angulation as the artery was quite deep and after 2 unsuccessful attempts I elected to cut down on this. The guidewire was reintroduced through the Perclose and an 8 French sheath was placed. I then made a transverse incision above the inguinal crease and dissection was carried down to the common femoral artery. The patient had a high bifurcation of the common femoral artery and the stick was just under the inguinal ligament. I controlled the artery proximally and distally to this removed the sheath and repaired the hole in the artery with 5-0 Prolene. I then cannulated adjacent to this and introduced a wire and 8 [place]  sheath.  On the right side, the artery was preclosed with 2 Perclose devices as dictated by Dr. Bridgett Larsson.   The patient was then heparinized.  On the left side the 8 French sheath was removed and an 69 French sheath placed without difficulty. A pigtail catheter was placed in the right side and then the trunk ipsilateral component, which was a 31 mm x 14 mm x 17 cm device was selected. This was introduced from the left side and the gate was crossed. This was positioned at the top of the L1 vertebral body and an arteriogram obtained of the neck of the aneurysm using the pigtail catheter from the right side. The top of the device was positioned just below the renals and the device deployed. It was somewhat low so the proximal graft was constrained in the graft advanced and then redeployed and was in good position with good perfusion of both renal arteries.   The contralateral gate was cannulated as dictated by Dr. Bridgett Larsson. Position was confirmed using the pigtail catheter. The Amplatz wire was placed in the right limb placed again as dictated by Dr. Bridgett Larsson.  Once this was complete and iliac arteriogram was obtained on the left using a RAO projection to demonstrate the position of the hypogastric artery. The ipsilateral limb was then deployed without difficulty. The proximal graft and overlap regions in addition to the distal graft were all ballooned and a completion film showed a slight blush at the junction of the right iliac limb. This was re-ballooned as was the proximal neck. The grafted  moved down slightly below the renals but there was an excellent seal with no evidence of type II endoleak and I did not think a cuff was necessary.  On the right side the sheath was removed and the Perclose device is secured with good hemostasis.  On the left side sheath was removed after the arteries were controlled proximally and distally. The artery was repaired with a 5-0 Prolene suture and there was good hemostasis. The  left groin wound was irrigated. The femoral sheath was closed with running 2-0 Vicryl. The subcutaneous layer was closed with 2 layers of 3-0 Vicryl. The skin was closed with 4-0 Vicryl. There was excellent Doppler signal in the common femoral artery at the completion. Both feet were warm and well-perfused.  Dermabond was applied. All needle and sponge counts were correct. The patient tolerated the procedure well and was transferred to the recovery room in stable condition.  Deitra Mayo, MD, FACS [facility]  DATE OF DICTATION:   08/31/2016

## 2016-08-31 NOTE — Progress Notes (Signed)
3 S still not able to take report

## 2016-08-31 NOTE — Anesthesia Procedure Notes (Signed)
Procedure Name: Intubation Date/Time: 08/31/2016 7:46 AM Performed by: Candis Shine Pre-anesthesia Checklist: Patient identified, Emergency Drugs available, Suction available and Patient being monitored Patient Re-evaluated:Patient Re-evaluated prior to inductionOxygen Delivery Method: Circle System Utilized Preoxygenation: Pre-oxygenation with 100% oxygen Intubation Type: IV induction Ventilation: Mask ventilation without difficulty and Oral airway inserted - appropriate to patient size Laryngoscope Size: Mac and 3 Grade View: Grade I Tube type: Oral Tube size: 7.5 mm Number of attempts: 1 Airway Equipment and Method: Stylet and Oral airway Placement Confirmation: ETT inserted through vocal cords under direct vision,  positive ETCO2 and breath sounds checked- equal and bilateral Secured at: 23 cm Tube secured with: Tape Dental Injury: Teeth and Oropharynx as per pre-operative assessment

## 2016-08-31 NOTE — Progress Notes (Signed)
Patient arrived to 3S01 from PACU. A&OX4, but slightly drowsy. Vitals stable. Right radial art line in place. IVF infusing per orders. CCMD and Elink notified of patient's arrival. CHG bath and foley care per protocol. Neuro check WNL. Right femoral site with gauze, level 0. Left femoral site with skin glue, level 1 due to bruising.   Joellen Jersey, RN.

## 2016-08-31 NOTE — Progress Notes (Signed)
3s still not able to take report- said they will call back.

## 2016-08-31 NOTE — Anesthesia Preprocedure Evaluation (Signed)
Anesthesia Evaluation  Patient identified by MRN, date of birth, ID band Patient awake    Reviewed: Allergy & Precautions, NPO status , Patient's Chart, lab work & pertinent test results, reviewed documented beta blocker date and time   History of Anesthesia Complications Negative for: history of anesthetic complications  Airway Mallampati: II  TM Distance: >3 FB Neck ROM: Full    Dental  (+) Dental Advisory Given, Edentulous Upper, Edentulous Lower   Pulmonary sleep apnea and Continuous Positive Airway Pressure Ventilation , COPD, former smoker,    Pulmonary exam normal breath sounds clear to auscultation       Cardiovascular hypertension, Pt. on medications and Pt. on home beta blockers + Peripheral Vascular Disease  Normal cardiovascular exam Rhythm:Regular Rate:Normal     Neuro/Psych PSYCHIATRIC DISORDERS Schizophrenia negative neurological ROS     GI/Hepatic Neg liver ROS, GERD  Medicated,  Endo/Other  diabetes, Type 2, Insulin DependentObesity   Renal/GU negative Renal ROS     Musculoskeletal  (+) Arthritis ,   Abdominal   Peds  Hematology negative hematology ROS (+)   Anesthesia Other Findings Day of surgery medications reviewed with the patient.  Reproductive/Obstetrics                             Anesthesia Physical Anesthesia Plan  ASA: III  Anesthesia Plan: General   Post-op Pain Management:    Induction: Intravenous  Airway Management Planned: Oral ETT  Additional Equipment: Arterial line  Intra-op Plan:   Post-operative Plan: Extubation in OR  Informed Consent: I have reviewed the patients History and Physical, chart, labs and discussed the procedure including the risks, benefits and alternatives for the proposed anesthesia with the patient or authorized representative who has indicated his/her understanding and acceptance.   Dental advisory given  Plan  Discussed with: CRNA  Anesthesia Plan Comments: (Risks/benefits of general anesthesia discussed with patient including risk of damage to teeth, lips, gum, and tongue, nausea/vomiting, allergic reactions to medications, and the possibility of heart attack, stroke and death.  All patient questions answered.  Patient wishes to proceed.)        Anesthesia Quick Evaluation

## 2016-08-31 NOTE — Transfer of Care (Signed)
Immediate Anesthesia Transfer of Care Note  Patient: Rodney Orozco  Procedure(s) Performed: Procedure(s): ABDOMINAL AORTIC ENDOVASCULAR STENT GRAFT and Open Exposure left common femoral artery (N/A)  Patient Location: PACU  Anesthesia Type:General  Level of Consciousness: awake, alert  and oriented  Airway & Oxygen Therapy: Patient Spontanous Breathing and Patient connected to face mask oxygen  Post-op Assessment: Report given to RN and Post -op Vital signs reviewed and stable  Post vital signs: Reviewed and stable  Last Vitals:  Vitals:   08/31/16 0608  BP: (!) 179/85  Pulse: 66  Resp: 18  Temp: 36.8 C    Last Pain:  Vitals:   08/31/16 0608  TempSrc: Oral      Patients Stated Pain Goal: 4 (11/91/47 8295)  Complications: No apparent anesthesia complications

## 2016-09-01 ENCOUNTER — Encounter (HOSPITAL_COMMUNITY): Payer: Self-pay | Admitting: Vascular Surgery

## 2016-09-01 ENCOUNTER — Telehealth: Payer: Self-pay | Admitting: Vascular Surgery

## 2016-09-01 LAB — CBC
HCT: 36.7 % — ABNORMAL LOW (ref 39.0–52.0)
HEMOGLOBIN: 11.9 g/dL — AB (ref 13.0–17.0)
MCH: 29.5 pg (ref 26.0–34.0)
MCHC: 32.4 g/dL (ref 30.0–36.0)
MCV: 91.1 fL (ref 78.0–100.0)
PLATELETS: 255 10*3/uL (ref 150–400)
RBC: 4.03 MIL/uL — AB (ref 4.22–5.81)
RDW: 15.1 % (ref 11.5–15.5)
WBC: 12.5 10*3/uL — AB (ref 4.0–10.5)

## 2016-09-01 LAB — BASIC METABOLIC PANEL
ANION GAP: 8 (ref 5–15)
BUN: 11 mg/dL (ref 6–20)
CHLORIDE: 103 mmol/L (ref 101–111)
CO2: 27 mmol/L (ref 22–32)
Calcium: 8.3 mg/dL — ABNORMAL LOW (ref 8.9–10.3)
Creatinine, Ser: 0.91 mg/dL (ref 0.61–1.24)
GFR calc Af Amer: >60 mL/min (ref >60)
GLUCOSE: 194 mg/dL — AB (ref 65–99)
POTASSIUM: 3.1 mmol/L — AB (ref 3.5–5.1)
Sodium: 138 mmol/L (ref 135–145)

## 2016-09-01 LAB — GLUCOSE, CAPILLARY: GLUCOSE-CAPILLARY: 188 mg/dL — AB (ref 65–99)

## 2016-09-01 MED ORDER — HYDROCODONE-ACETAMINOPHEN 5-325 MG PO TABS: 1.0000 | ORAL_TABLET | Freq: Four times a day (QID) | ORAL | 0 refills | Status: DC | PRN

## 2016-09-01 NOTE — Discharge Summary (Signed)
EVAR Discharge Summary   Rodney Orozco [birth year] 75 y.o. male  MRN: [id number]  Admission Date: 08/31/2016  Discharge Date: 09/01/16  Physician: Angelia Mould, MD  Admission Diagnosis: Abdominal aortic aneurysm I71.4   HPI:   This is a 75 y.o. male who I saw in consultation on 08/03/2016 with a 5.7 cm infrarenal abdominal aortic aneurysm. He was hospitalized in February of this year and underwent a CT of the chest to rule out a pulmonary embolus. An incidental finding was his abdominal aortic aneurysm. His scan was done elsewhere. He had a known small abdominal aortic aneurysm and was being followed elsewhere but was then lost to follow up.  He has multiple complicating factors.  He's had previous abdominal surgery for peptic ulcer disease. He has a greater than 100-pack-year history of smoking. He did quit 5 years ago. He does not tolerate statins. He has COPD and also a history of systolic and diastolic heart failure. He is on Coumadin because during his last admission he had atrial fibrillation with a rapid ventricular response and he had a pulmonary embolus in February 2016 after a right total knee replacement.  Hospital Course:  The patient was admitted to the hospital and taken to the operating room on 08/31/2016 and underwent: EVAR    The pt tolerated the procedure well and was transported to the PACU in good condition.   By POD 1, he is doing well.  His creatinine is normal.  He is informed to hold his Metformin until 09/03/16.  He is on Xarelto and not coumadin at this time.  Discussed with pt he needs to f/u with Dr. Melina Copa to see if he needs to go back on Coumadin or come off completely since he is in NSR.  The remainder of the hospital course consisted of increasing mobilization and increasing intake of solids without difficulty.  CBC    Component Value Date/Time   WBC 12.5 (H) 09/01/2016 0500   RBC 4.03 (L) 09/01/2016 0500   HGB 11.9 (L) 09/01/2016  0500   HCT 36.7 (L) 09/01/2016 0500   PLT 255 09/01/2016 0500   MCV 91.1 09/01/2016 0500   MCH 29.5 09/01/2016 0500   MCHC 32.4 09/01/2016 0500   RDW 15.1 09/01/2016 0500   LYMPHSABS 0.8 01/06/2014 1617   MONOABS 1.1 (H) 01/06/2014 1617   EOSABS 0.2 01/06/2014 1617   BASOSABS 0.1 01/06/2014 1617    BMET    Component Value Date/Time   NA 138 09/01/2016 0500   K 3.1 (L) 09/01/2016 0500   CL 103 09/01/2016 0500   CO2 27 09/01/2016 0500   GLUCOSE 194 (H) 09/01/2016 0500   BUN 11 09/01/2016 0500   CREATININE 0.91 09/01/2016 0500   CALCIUM 8.3 (L) 09/01/2016 0500   GFRNONAA >60 09/01/2016 0500   GFRAA >60 09/01/2016 0500       Discharge Instructions    ABDOMINAL PROCEDURE/ANEURYSM REPAIR/AORTO-BIFEMORAL BYPASS:  Call MD for increased abdominal pain; cramping diarrhea; nausea/vomiting    Complete by:  As directed    Call MD for:  redness, tenderness, or signs of infection (pain, swelling, bleeding, redness, odor or green/yellow discharge around incision site)    Complete by:  As directed    Call MD for:  severe or increased pain, loss or decreased feeling  in affected limb(s)    Complete by:  As directed    Call MD for:  temperature >100.5    Complete by:  As directed    Discharge instructions  Complete by:  As directed    You may restart your Metformin on Sunday 09/03/16.   Discharge wound care:    Complete by:  As directed    Shower daily with soap and water starting 09/02/16   Driving Restrictions    Complete by:  As directed    No driving for 2 weeks   Lifting restrictions    Complete by:  As directed    No lifting for 4 weeks   Resume previous diet    Complete by:  As directed       Discharge Diagnosis:  Abdominal aortic aneurysm I71.4  Secondary Diagnosis: Patient Active Problem List   Diagnosis Date Noted  . AAA (abdominal aortic aneurysm) ([place]) 08/31/2016  . Long-term (current) use of anticoagulants 08/25/2016  . Central sleep apnea 08/08/2016  .  COPD GOLD III 07/21/2016  . Paroxysmal atrial fibrillation ([place]) 07/12/2016  . Left ventricular dysfunction 07/12/2016  . Acute combined systolic and diastolic heart failure ([place]) 07/12/2016  . Abdominal aortic aneurysm (AAA) ([place]) 07/12/2016  . Cognitive changes 04/25/2016  . Gastroesophageal reflux disease without esophagitis 07/29/2015  . Morbid obesity due to excess calories ([place]) 05/13/2015  . Primary osteoarthritis of right knee 02/09/2015  . Pulmonary embolism without acute cor pulmonale ([place]) 02/09/2015  . Postoperative pulmonary embolism ([place]) 01/21/2015  . DM type 2 with diabetic dyslipidemia ([place]) 08/18/2014  . Other and unspecified hyperlipidemia 04/27/2014  . Influenza B 01/07/2014  . Streptococcus pneumoniae pneumonia ([place]) 01/06/2014  . Acute respiratory failure with hypoxia ([place]) 01/06/2014  . COPD with acute exacerbation ([place]) 01/06/2014  . Healthcare-associated pneumonia 01/06/2014  . Diabetes mellitus without complication ([place])   . Hypertension   . Dyslipidemia    Past Medical History:  Diagnosis Date  . A-fib ([place])   . AAA (abdominal aortic aneurysm) ([place]) 2017  . AAA (abdominal aortic aneurysm) ([place]) 1999   /pt report on 08/31/2016  . Asthma   . Atrial fibrillation with RVR ([place])   . CHF (congestive heart failure) ([place])   . Dyslipidemia   . HCAP (healthcare-associated pneumonia) 12/2013   Archie Endo 01/06/2014  . Hypertension   . Mixed hyperlipidemia   . On home oxygen therapy    "2L at night" (08/31/2016)  . OSA on CPAP   . Osteoarthritis   . Pneumonia December 2014   Had hemoptysis and admitted at [facility].  . Pulmonary embolism ([place]) 12/2014   S/P knee replacement/notes 08/31/2016  . Stroke ([place])   . Type II diabetes mellitus ([place])   . Vitamin D deficiency        Medication List    TAKE these medications   albuterol (2.5 MG/3ML) 0.083% nebulizer solution Commonly known as:  PROVENTIL Inhale 3 mLs into the lungs as needed for shortness  of breath.   diltiazem 180 MG 24 hr capsule Commonly known as:  CARDIZEM CD Take 1 capsule (180 mg total) by mouth daily.   donepezil 10 MG tablet Commonly known as:  ARICEPT Take 1 tablet (10 mg total) by mouth at bedtime.   famotidine 20 MG tablet Commonly known as:  PEPCID Take 1 tablet by mouth 2 (two) times daily.   furosemide 40 MG tablet Commonly known as:  LASIX Take 1 tablet by mouth 2 (two) times daily.   HYDROcodone-acetaminophen 5-325 MG tablet Commonly known as:  NORCO/VICODIN Take 1 tablet by mouth every 6 (six) hours as needed for moderate pain.   losartan 100 MG tablet Commonly known as:  COZAAR  Take 100 mg by mouth daily.   metFORMIN 500 MG 24 hr tablet Commonly known as:  GLUCOPHAGE-XR Take 1,000 mg by mouth 2 (two) times daily.   metoprolol tartrate 25 MG tablet Commonly known as:  LOPRESSOR Take 1 tablet by mouth 2 (two) times daily.   NOVOLIN N RELION 100 UNIT/ML injection Generic drug:  insulin NPH Human Inject 10 Units into the skin 2 (two) times daily.   OXYGEN 2lpm 24/7  DME- [facility]   rivaroxaban 20 MG Tabs tablet Commonly known as:  XARELTO Take 1 tablet (20 mg total) by mouth daily with supper.   VICTOZA 18 MG/3ML Sopn Generic drug:  liraglutide Inject 18 mg into the skin daily.       Prescriptions given: Vicodin #6 No Refill  Instructions: 1.  Shower daily with soap and water starting 09/02/16 2.  No driving x 2 weeks. 3.  No heavy lifting x 4 weeks  Disposition: home  Patient's condition: is Good  Follow up: 1. Dr. Scot Dock in 4 weeks with CTA   Leontine Locket, PA-C [facility] [phone] 09/01/2016  7:34 AM   - For VQI Registry use --- Instructions: Press F2 to tab through selections.  Delete question if not applicable.   Post-op:  Time to Extubation: '[x]'$  In OR, '[ ]'$  < 12 hrs, '[ ]'$  12-24 hrs, '[ ]'$  >=24 hrs Vasopressors Req. Post-op: No MI: No., '[ ]'$  Troponin only, '[ ]'$  EKG or Clinical New  Arrhythmia: No CHF: No ICU Stay: 1 day in stepdown Transfusion: No  If yes, n/a units given  Complications: Resp failure: No., '[ ]'$  Pneumonia, '[ ]'$  Ventilator Chg in renal function: No., '[ ]'$  Inc. Cr > 0.5, '[ ]'$  Temp. Dialysis, '[ ]'$  Permanent dialysis Leg ischemia: No., no Surgery needed, '[ ]'$  Yes, Surgery needed, '[ ]'$  Amputation Bowel ischemia: No., '[ ]'$  Medical Rx, '[ ]'$  Surgical Rx Wound complication: No., '[ ]'$  Superficial separation/infection, '[ ]'$  Return to OR Return to OR: No  Return to OR for bleeding: No Stroke: No., '[ ]'$  Minor, '[ ]'$  Major  Discharge medications: Statin use:  Yes If No: '[ ]'$  For Medical reasons, '[ ]'$  Non-compliant, '[ ]'$  Not-indicated ASA use:  Yes  If No: '[ ]'$  For Medical reasons, '[ ]'$  Non-compliant, '[ ]'$  Not-indicated Plavix use:  No If No: '[ ]'$  For Medical reasons, '[ ]'$  Non-compliant, '[ ]'$  Not-indicated Beta blocker use:  Yes If No: '[ ]'$  For Medical reasons, '[ ]'$  Non-compliant, '[ ]'$  Not-indicated

## 2016-09-01 NOTE — Progress Notes (Signed)
No BIPAP machine available for pt to wear QHS. When one becomes available will place pt on. RN aware.

## 2016-09-01 NOTE — Care Management Note (Signed)
Case Management Note  Patient Details  Name: Rodney Orozco MRN: [id number] Date of Birth: [birth year]  Subjective/Objective:  AAA,  Endovascular repair of AAA                 Action/Plan: Discharge Planning: AVS reviewed: NCM spoke to pt and wife. States his oxygen and Bipap is with [facility].. Wife states his DME is operating without any problems. She and dtr are at home to assist with his care. Pt states he can afford his medications.   PCP- [name] MD  Expected Discharge Date:  09/01/2016             Expected Discharge Plan:  Home/Self Care  In-House Referral:  NA  Discharge planning Services  CM Consult  Post Acute Care Choice:  NA Choice offered to:  NA  DME Arranged:  N/A (oxygen, bipap from [place]) DME Agency:  NA  HH Arranged:  NA HH Agency:  NA  Status of Service:  Completed, signed off  If discussed at [place] of Stay Meetings, dates discussed:    Additional Comments:  Erenest Rasher, RN 09/01/2016, 12:33 PM

## 2016-09-01 NOTE — Progress Notes (Signed)
   VASCULAR SURGERY ASSESSMENT & PLAN:  1 Day Post-Op s/p: EVAR.  Renal fn is normal.  Home today once he voids, ambulates, and eats.   F/U in 1 month with CTA Abd/Pelvis  He was on Coumadin for PE in 2016 and h/o afib. He can restart that tonight, but I have asked him to check with his primary medical doctor to see if he can stop that. He is in NSR currently.   He is on ASA ('81mg'$ ) and a statin  SUBJECTIVE: No complaints  PHYSICAL EXAM: Vitals:   09/01/16 0016 09/01/16 0104 09/01/16 0313 09/01/16 0615  BP: (!) 177/77 (!) 173/81 (!) 173/78 (!) 190/87  Pulse: 81  81 83  Resp: 19  19 (!) 21  Temp: 99.1 F (37.3 C)  98.7 F (37.1 C) 98.6 F (37 C)  TempSrc: Oral  Oral Oral  SpO2: 96%   97%  Weight:   240 lb 12.8 oz (109.2 kg)   Height:       Groins look fine Palpable PT pulses.   LABS: Lab Results  Component Value Date   WBC 12.5 (H) 09/01/2016   HGB 11.9 (L) 09/01/2016   HCT 36.7 (L) 09/01/2016   MCV 91.1 09/01/2016   PLT 255 09/01/2016   Lab Results  Component Value Date   CREATININE 0.91 09/01/2016   Lab Results  Component Value Date   INR 1.09 08/31/2016   CBG (last 3)   Recent Labs  08/31/16 1049 08/31/16 1630 08/31/16 2146  GLUCAP 98 154* 232*    Active Problems:   AAA (abdominal aortic aneurysm) ([place])   Gae Gallop Beeper: [phone] 09/01/2016

## 2016-09-01 NOTE — Telephone Encounter (Signed)
LVM and mailed letter for CTA on 11/29 and f/u on 12/6

## 2016-09-01 NOTE — Telephone Encounter (Signed)
-----   Message from Mena Goes, RN sent at 08/31/2016 10:29 AM EDT ----- Regarding: postop w/ CTA   ----- Message ----- From: Gabriel Earing, PA-C Sent: 08/31/2016  10:19 AM To: Vvs Charge Pool  S/p EVAR 08/31/16.  F/u with [facility] in 4 weeks with CTA protocol.  Thanks, Aldona Bar

## 2016-09-01 NOTE — Progress Notes (Signed)
Pt education completed to include future appointments, current prescriptions and medications, and doctors discharge instructions. Pt alert and oriented, vital signs stable, MD aware of BP. Pt discharged with nursing staff and family via wheelchair. Temp: 98.6 F (37 C) (10/20 0615) Temp Source: Oral (10/20 0615) BP: 190/87 (10/20 0615) Pulse Rate: 83 (10/20 0615)  Rodney Orozco 09/01/2016 11:17 AM

## 2016-09-03 NOTE — Anesthesia Postprocedure Evaluation (Signed)
Anesthesia Post Note  Patient: Rodney Orozco  Procedure(s) Performed: Procedure(s) (LRB): ABDOMINAL AORTIC ENDOVASCULAR STENT GRAFT and Open Exposure left common femoral artery (N/A)  Patient location during evaluation: PACU Anesthesia Type: General Level of consciousness: awake Pain management: pain level controlled Vital Signs Assessment: post-procedure vital signs reviewed and stable Respiratory status: spontaneous breathing Cardiovascular status: stable Postop Assessment: no signs of nausea or vomiting Anesthetic complications: no    Last Vitals:  Vitals:   09/01/16 0313 09/01/16 0615  BP: (!) 173/78 (!) 190/87  Pulse: 81 83  Resp: 19 (!) 21  Temp: 37.1 C 37 C    Last Pain:  Vitals:   09/01/16 0615  TempSrc: Oral  PainSc:                  [name]

## 2016-10-04 ENCOUNTER — Ambulatory Visit: Payer: Medicare HMO | Admitting: Vascular Surgery

## 2016-10-11 ENCOUNTER — Encounter: Payer: Self-pay | Admitting: Vascular Surgery

## 2016-10-11 ENCOUNTER — Ambulatory Visit (HOSPITAL_COMMUNITY)
Admission: RE | Admit: 2016-10-11 | Discharge: 2016-10-11 | Disposition: A | Discharge status: Home / Self Care | Payer: Medicare HMO | Source: Ambulatory Visit | Attending: Vascular Surgery | Admitting: Vascular Surgery

## 2016-10-11 DIAGNOSIS — I714 Abdominal aortic aneurysm, without rupture, unspecified: Secondary | ICD-10-CM

## 2016-10-11 DIAGNOSIS — Z9889 Other specified postprocedural states: Secondary | ICD-10-CM | POA: Insufficient documentation

## 2016-10-11 DIAGNOSIS — J929 Pleural plaque without asbestos: Secondary | ICD-10-CM | POA: Insufficient documentation

## 2016-10-11 DIAGNOSIS — I2510 Atherosclerotic heart disease of native coronary artery without angina pectoris: Secondary | ICD-10-CM | POA: Diagnosis not present

## 2016-10-11 DIAGNOSIS — K8020 Calculus of gallbladder without cholecystitis without obstruction: Secondary | ICD-10-CM | POA: Diagnosis not present

## 2016-10-11 DIAGNOSIS — K4090 Unilateral inguinal hernia, without obstruction or gangrene, not specified as recurrent: Secondary | ICD-10-CM | POA: Diagnosis not present

## 2016-10-11 DIAGNOSIS — Z95828 Presence of other vascular implants and grafts: Secondary | ICD-10-CM | POA: Diagnosis not present

## 2016-10-11 LAB — POCT I-STAT CREATININE: Creatinine, Ser: 1.5 mg/dL — ABNORMAL HIGH (ref 0.61–1.24)

## 2016-10-11 MED ORDER — IOPAMIDOL (ISOVUE-370) INJECTION 76%
80.0000 mL | Freq: Once | INTRAVENOUS | Status: AC | PRN
  Administered 2016-10-11: 80 mL via INTRAVENOUS

## 2016-10-18 ENCOUNTER — Ambulatory Visit (INDEPENDENT_AMBULATORY_CARE_PROVIDER_SITE_OTHER): Payer: Medicare HMO | Admitting: Vascular Surgery

## 2016-10-18 ENCOUNTER — Ambulatory Visit: Payer: Medicare HMO | Admitting: Vascular Surgery

## 2016-10-18 ENCOUNTER — Encounter: Payer: Self-pay | Admitting: Vascular Surgery

## 2016-10-18 VITALS — BP 184/90 | HR 70 | Temp 98.2°F | Resp 18 | Ht 70.5 in | Wt 235.0 lb

## 2016-10-18 DIAGNOSIS — Z48812 Encounter for surgical aftercare following surgery on the circulatory system: Secondary | ICD-10-CM

## 2016-10-18 NOTE — Progress Notes (Signed)
Patient name: Rodney Orozco MRN: [id number] DOB: 03-21-1941 Sex: male  REASON FOR VISIT: Follow up after endovascular aneurysm repair.  HPI: Rodney Orozco is a 75 y.o. male who had presented with a 6.1 cm infrarenal abdominal aortic aneurysm. This was an incidental finding after a CT scan of the chest to rule out a pulmonary embolus.   He has done well since surgery and is gradually resume his normal activities. He denies abdominal pain or back pain. He denies fever or chills. It's been over a year since he was placed on and required dilation for his pulmonary embolus and he is asking whether or not he can discontinue this for my standpoint.  Current Outpatient Prescriptions  Medication Sig Dispense Refill  . donepezil (ARICEPT) 10 MG tablet Take 1 tablet (10 mg total) by mouth at bedtime. 30 tablet 12  . famotidine (PEPCID) 20 MG tablet Take 1 tablet by mouth 2 (two) times daily.    . furosemide (LASIX) 40 MG tablet Take 1 tablet by mouth 2 (two) times daily.    . Insulin Glargine (LANTUS) 100 UNIT/ML Solostar Pen Inject into the skin.    . Insulin Glargine 300 UNIT/ML SOPN Inject into the skin.    Marland Kitchen losartan (COZAAR) 100 MG tablet Take 100 mg by mouth daily.    . metFORMIN (GLUCOPHAGE-XR) 500 MG 24 hr tablet Take 1,000 mg by mouth 2 (two) times daily.    . metoprolol tartrate (LOPRESSOR) 25 MG tablet Take 1 tablet by mouth 2 (two) times daily.    . Misc. Devices MISC Diclofenac 3% Baclofen 2% lidocaine 5% menthol 1%,  Apply 1-2 gm 3-4 times daily to right knee    . NOVOLIN N RELION 100 UNIT/ML injection Inject 10 Units into the skin 2 (two) times daily.     . OXYGEN 2lpm 24/7  DME- [facility]    . rivaroxaban (XARELTO) 20 MG TABS tablet Take 1 tablet (20 mg total) by mouth daily with supper. 14 tablet 0  . albuterol (PROVENTIL) (2.5 MG/3ML) 0.083% nebulizer solution Inhale 3 mLs into the lungs as needed for shortness of breath.    . diltiazem (CARDIZEM CD) 180 MG 24 hr capsule Take 1  capsule (180 mg total) by mouth daily. 90 capsule 3  . HYDROcodone-acetaminophen (NORCO/VICODIN) 5-325 MG tablet Take 1 tablet by mouth every 6 (six) hours as needed for moderate pain. (Patient not taking: Reported on 10/18/2016) 6 tablet 0  . VICTOZA 18 MG/3ML SOPN Inject 18 mg into the skin daily.      No current facility-administered medications for this visit.     REVIEW OF SYSTEMS:  '[X]'$  denotes positive finding, '[ ]'$  denotes negative finding Cardiac  Comments:  Chest pain or chest pressure:    Shortness of breath upon exertion:    Short of breath when lying flat:    Irregular heart rhythm:    Constitutional    Fever or chills:      PHYSICAL EXAM: Vitals:   10/18/16 1047 10/18/16 1050  BP: (!) 185/92 (!) 184/90  Pulse: 70 70  Resp: 18   Temp: 98.2 F (36.8 C)   SpO2: 93%   Weight: 235 lb (106.6 kg)   Height: 5' 10.5" (1.791 m)     GENERAL: The patient is a well-nourished male, in no acute distress. The vital signs are documented above. CARDIOVASCULAR: There is a regular rate and rhythm. PULMONARY: There is good air exchange bilaterally without wheezing or rales. He has palpable femoral  pulses bilaterally. His left groin incision is healing nicely. The right groin site looks fine also. Abdomen is soft and nontender.  CT ABDOMEN PELVIS: I have reviewed the CT of the abdomen and pelvis that was done on 10/11/2016. This shows the stent graft in good position with no evidence of endoleak. The maximum diameter 6.1 cm.  MEDICAL ISSUES:  STATUS POST ENDOVASCULAR ANEURYSM REPAIR: The patient is doing well status post endovascular aneurysm repair. His CT scan shows no evidence of endoleak and the graft is in good position. For this reason I think we can follow him with duplex at this point and I will order duplex in 6 months and see him back at that time. From my standpoint he can stop the [place] and I have instructed him to begin taking aspirin. He does not tolerate statins. I'll  see him back in 6 months. He knows to call sooner if he has problems.  Deitra Mayo [facility] [phone]

## 2016-10-18 NOTE — Addendum Note (Signed)
Addended by: Lianne Cure A on: 10/18/2016 02:17 PM   Modules accepted: Orders

## 2016-10-25 ENCOUNTER — Encounter: Payer: Self-pay | Admitting: Neurology

## 2016-10-25 ENCOUNTER — Telehealth: Payer: Self-pay

## 2016-10-25 ENCOUNTER — Ambulatory Visit (INDEPENDENT_AMBULATORY_CARE_PROVIDER_SITE_OTHER): Payer: Medicare HMO | Admitting: Neurology

## 2016-10-25 VITALS — BP 165/86 | HR 68 | Wt 237.2 lb

## 2016-10-25 DIAGNOSIS — R413 Other amnesia: Secondary | ICD-10-CM

## 2016-10-25 NOTE — Patient Instructions (Signed)
Remember to drink plenty of fluid, eat healthy meals and do not skip any meals. Try to eat protein with a every meal and eat a healthy snack such as fruit or nuts in between meals. Try to keep a regular sleep-wake schedule and try to exercise daily, particularly in the form of walking, 20-30 minutes a day, if you can.   I would like to see you back in 6-9 months, sooner if we need to. Please call us with any interim questions, concerns, problems, updates or refill requests.   Our phone number is [phone]. We also have an after hours call service for urgent matters and there is a physician on-call for urgent questions. For any emergencies you know to call 911 or go to the nearest emergency room

## 2016-10-25 NOTE — Progress Notes (Signed)
[facility]    Provider:  Dr Jaynee Eagles Referring Provider: [name], DO Primary Care Physician:  [name], DO CC:  Speech difficulties  Interval history 10/25/2016:  75 y.o. male here as a referral from Dr. Edrick Oh for memory problems.  Patient has a past medical history of diabetes, hypertension, mixed hyperlipidemia, smoker for 50 years 2 packs per day, vitamin D deficient osteomalacia, altered mental status, multiple remote lacunar infarcts likely due to small vessel disease secondary to multiple vascular risk factors. He was started on Aricept at last appointment, a sleep eval for likely severe OSA was highly encouraged as this can cause memory and other significant problems, he was advised to continue asa and manage vascular risk factors with primary care. He was started on bipap and O2 by our sleep specialists and referred to pulmonology for nocturnal hypoxia.   He is using the bipap every night. His MMSE is improved today 27/30. He has been hospitalized twice since being to see [place]. He had an aortic stent placed. He has good and bad days. Forgets his train of thought. He feels improved, wife does not think so. She feels he is having diarrhea fro the aricept. Stop the aricept for a little bit and see if the diarrhea gets better, if it does we can go to a patch.   HPI:  Rodney Orozco is a 75 y.o. male here as a referral from Dr. Edrick Oh for altered mental status. Patient has a past medical history of diabetes, hypertension, mixed hyperlipidemia, smoker for 50 years 2 packs per day, vitamin D deficient osteomalacia, altered mental status. He had back surgery in 1992 and replacement in February 2016 in December 2016, B12 deficiency, likely untreated sleep apnea. He is feeling a lot better as far as his knees but his memory is worsened since. He has not been acting like himself since the surgery. Wife provides most information. He gets confused about things, he will try to  tell you something and can't think of the words, his personality has changed. He is more agitated. He has always been more laid back. Patient says he does get agitated sometimes but unsure why. His memory is worsening. He forgets things he needs to do. Wife says they have 9 great grand children and he gets one confused as far the name goes. He loses track of conversations, forgets people he recently meets, he gets people confused as far as names go. Patient pays the bills and he needs help with the bank account and wife has to help with medication as well. No hallucinations. He does not want to go out and socialize. Patient feels his memory is worsening. Has not been himself. Wife feels like there is depression, he acts sad, but patient denies feeling sad. He feels frustrated because he can't remember things. His mother had dementia. She was 25 and had some memory changes also had a history of strokes. He has been on aspirin for one year before that never took aspirin. Now he takes '81mg'$  daily. He is on a medication for cholesterol. Reviewed MRI of the brain with patient and wife with multiple lacunar infarcts could be small vessel disease due to vascular risk factors and long-term history of smoking. Will not change management unclear how old the infarcts are and he has only been on aspirin daily for the last year.  Reviewed notes, labs and imaging from outside physicians, which showed: I reviewed records from primary care. Patient has had some episodes of  low a.m. blood sugars. Wife has voiced some problems of memory and irritability since his last surgery. Apparently he has not been back to normal or bit himself and surgery for his last total knee replacement, having more agitation, usually has no temper, is more snappy, whole personality change. Note stated he is acting funny. She has been quizzing him today about things he has been getting wrong or mixed up. Exam of cavity, throat, neck thyroid, skin, heart,  lungs, abdomen, extremities and neurologic were normal.  Reviewed  MRI brain completed 04/11/2016. Personally reviewed images and agree with the following:  Remote lacunar infarcts are present within the caudate heads bilaterally in the anterior right lentiform nucleus. Dilated perivascular spaces are present within the basal ganglia. A remote medial left baclofen or infarct is present. A remote lacunar infarct is present in the right cerebellar white matter. Moderate periventricular diffuse subcortical white matter changes present bilaterally as well. White matter disease extends into the brainstem. The internal auditory canals are within normal limits bilaterally. Flow is present in the major intracranial arteries. The globes and orbits are intact. The paranasal sinuses are clear. There is some fluid in the mastoid air cells. Impression was multiple and remote lacunar infarcts in the basal ganglia bilaterally. No acute or subacute abnormality. Bilateral mastoid effusions without obstructing nasopharyngeal lesion.  Review of Systems: Patient complains of symptoms per HPI as well as the following symptoms: No chest pain, no shortness of breath. Pertinent negatives per HPI. All others negative.   Social History   Social History  . Marital status: Married    Spouse name: [name]  . Number of children: 3  . Years of education: 67   Occupational History  . Retired    Social History Main Topics  . Smoking status: Former Smoker    Packs/day: 2.00    Years: 50.00    Types: Cigarettes    Quit date: 11/07/2013  . Smokeless tobacco: Never Used  . Alcohol use No  . Drug use: No  . Sexual activity: Not on file   Other Topics Concern  . Not on file   Social History Narrative   Lives with wife   Caffeine use: 4 cups /day (coffee)    Family History  Problem Relation Age of Onset  . Dementia Mother   . Stroke Mother   . Heart attack Father     Past Medical History:  Diagnosis Date    . A-fib ([place])   . AAA (abdominal aortic aneurysm) ([place]) 2017  . AAA (abdominal aortic aneurysm) ([place]) 1999   /pt report on 08/31/2016  . Asthma   . Atrial fibrillation with RVR ([place])   . CHF (congestive heart failure) ([place])   . Dyslipidemia   . HCAP (healthcare-associated pneumonia) 12/2013   Rodney Orozco 01/06/2014  . Hypertension   . Mixed hyperlipidemia   . On home oxygen therapy    "2L at night" (08/31/2016)  . OSA on CPAP   . Osteoarthritis   . Pneumonia December 2014   Had hemoptysis and admitted at [facility].  . Pulmonary embolism ([place]) 12/2014   S/P knee replacement/notes 08/31/2016  . Stroke ([place])   . Type II diabetes mellitus ([place])   . Vitamin D deficiency     Past Surgical History:  Procedure Laterality Date  . ABDOMINAL AORTIC ANEURYSM REPAIR  08/31/2016  . ABDOMINAL AORTIC ENDOVASCULAR STENT GRAFT N/A 08/31/2016   Procedure: ABDOMINAL AORTIC ENDOVASCULAR STENT GRAFT and Open Exposure left common femoral artery;  Surgeon: Angelia Mould, MD;  Location: [facility] OR;  Service: Vascular;  Laterality: N/A;  . ABDOMINAL SURGERY  1999   "for aneurysm"  . BACK SURGERY    . JOINT REPLACEMENT    . [place]  . TOTAL KNEE ARTHROPLASTY Bilateral Feb 2016 - Dec 2016    Current Outpatient Prescriptions  Medication Sig Dispense Refill  . diltiazem (CARDIZEM CD) 180 MG 24 hr capsule Take 1 capsule (180 mg total) by mouth daily. 90 capsule 3  . donepezil (ARICEPT) 10 MG tablet Take 1 tablet (10 mg total) by mouth at bedtime. 30 tablet 12  . famotidine (PEPCID) 20 MG tablet Take 1 tablet by mouth 2 (two) times daily.    . furosemide (LASIX) 40 MG tablet Take 1 tablet by mouth 2 (two) times daily.    Marland Kitchen losartan (COZAAR) 100 MG tablet Take 100 mg by mouth daily.    . metFORMIN (GLUCOPHAGE-XR) 500 MG 24 hr tablet Take 1,000 mg by mouth 2 (two) times daily.    . metoprolol tartrate (LOPRESSOR) 25 MG tablet Take 1 tablet by mouth 2 (two) times daily.    . Misc.  Devices MISC Diclofenac 3% Baclofen 2% lidocaine 5% menthol 1%,  Apply 1-2 gm 3-4 times daily to right knee    . NOVOLIN N RELION 100 UNIT/ML injection Inject 10 Units into the skin 2 (two) times daily.     . OXYGEN 2lpm 24/7  DME- [facility]    . rivaroxaban (XARELTO) 20 MG TABS tablet Take 1 tablet (20 mg total) by mouth daily with supper. 14 tablet 0  . albuterol (PROVENTIL) (2.5 MG/3ML) 0.083% nebulizer solution Inhale 3 mLs into the lungs as needed for shortness of breath.     No current facility-administered medications for this visit.     Allergies as of 10/25/2016 - Review Complete 10/18/2016  Allergen Reaction Noted  . Codeine Other (See Comments) 04/25/2016  . Tramadol Dermatitis 04/25/2016  . Lipitor [atorvastatin] Other (See Comments) 01/06/2014  . Penicillins Rash 01/06/2014    Vitals: BP (!) 165/86 (BP Location: Right Arm, Patient Position: Sitting, Cuff Size: Normal)   Pulse 68   Wt 237 lb 3.2 oz (107.6 kg)   BMI 33.55 kg/m  Last Weight:  Wt Readings from Last 1 Encounters:  10/25/16 237 lb 3.2 oz (107.6 kg)   Last Height:   Ht Readings from Last 1 Encounters:  10/18/16 5' 10.5" (1.791 m)   MMSE - Mini Mental State Exam 10/25/2016 04/25/2016  Orientation to time 5 5  Orientation to Place 5 4  Registration 3 3  Attention/ Calculation 5 5  Recall 0 0  Language- name 2 objects 2 2  Language- repeat 1 1  Language- follow 3 step command 3 3  Language- read & follow direction 1 1  Write a sentence 1 1  Copy design 1 1  Total score 27 26    Cranial Nerves:    The pupils are equal, round, and reactive to light. Attempted funduscopic exam could not visualize due to small pupils. Visual fields are full to finger confrontation. Extraocular movements are intact. Trigeminal sensation is intact and the muscles of mastication are normal. The face is symmetric. The palate elevates in the midline. Hearing intact. Voice is normal. Shoulder shrug is normal. The tongue has  normal motion without fasciculations.   Coordination:    Normal finger to nose and heel to shin. Normal rapid alternating movements.   Gait:  Heel-toe and tandem gait are normal.   Motor Observation:    No asymmetry, no atrophy, and no involuntary movements noted. Tone:    Normal muscle tone.    Posture:    Posture is normal. normal erect    Strength:    Strength is V/V in the upper and lower limbs.      Sensation: intact to LT     Reflex Exam:  DTR's: Absent lowers (knees surgical) otherwise deep tendon reflexes in the upper extremities are normal bilaterally.   Toes:    The toes are downgoing bilaterally.   Clonus:    Clonus is absent.       Assessment/Plan:  75 y.o. male here as a referral from Dr. Edrick Oh for altered mental status. Patient has a past medical history of diabetes, hypertension, mixed hyperlipidemia, smoker for 50 years 2 packs per day, vitamin D deficient osteomalacia, altered mental status, multiple remote lacunar infarcts likely due to small vessel disease secondary to multiple vascular risk factors. He had back surgery in 1992 and replacement in February 2016 in December 2016, B12 deficiency, likely untreated sleep apnea. He is feeling a lot better as far as his knees but his memory is worsened since.Neuro exam is non focal.   - Labs today: B12, tsh, rpr normal - Snoring, memory loss, witnessed apneic events, daytime somnolence, stroke:  He was started on bipap and O2 by our sleep specialists and referred to pulmonology for nocturnal hypoxia. Follow with sleep team. F/u in January.  - Memory test today , MMSE 27/30 and MMSE 26/30 at last appointment, recheck in the future at next appt - Aricept '10mg'$ : He is having diarrhea, can stop the aricept and see if this improves his diarrhea. If diarrhea improves can try exelon patch otherwise f/u with pcp - Reviewed MRI of the brain with patient and wife with multiple lacunar infarcts could be small  vessel disease due to vascular risk factors and long-term history of smoking. Will not change management unclear how old the infarcts are and he has only been on aspirin daily for the last year. Needs close follow up with pcp for management of vascular risk factors  CC: Dr. Jac Canavan, MD  [facility] [address] [place], [place] [address]  Phone [phone] Fax [phone]  A total of 30 minutes was spent face-to-face with this patient. Over half this time was spent on counseling patient on the memory loss diagnosis and different diagnostic and therapeutic options available.

## 2016-10-25 NOTE — Telephone Encounter (Signed)
Spoke with patients wife regarding his cpap mask and leak. Pulled up a download from [employer] and it looks much better than the one in October. He is on a full face mask that [employer] gave him. Leak in down on average to 20 from 40.  He took mask off 3 nights which made average higher. She was happy to hear of the improvement.

## 2016-11-21 ENCOUNTER — Ambulatory Visit: Payer: Medicare HMO | Admitting: Neurology

## 2016-11-21 ENCOUNTER — Telehealth: Payer: Self-pay

## 2016-11-21 NOTE — Telephone Encounter (Signed)
I spoke to pt and r/s his appt for 1/11, provider is out sick.

## 2016-11-23 ENCOUNTER — Ambulatory Visit: Payer: Self-pay | Admitting: Neurology

## 2016-11-30 ENCOUNTER — Ambulatory Visit: Payer: Self-pay | Admitting: Neurology

## 2016-12-01 ENCOUNTER — Telehealth: Payer: Self-pay

## 2016-12-01 NOTE — Telephone Encounter (Signed)
I spoke to wife and she was able to r/s.

## 2016-12-01 NOTE — Telephone Encounter (Signed)
I called the patient and spoke to his wife, from home on Wednesday (1/17), and advised the pt's wife that the office is closed and we will call back to reschedule (due to the snow). Patient's wife voiced understanding.

## 2016-12-03 ENCOUNTER — Inpatient Hospital Stay (HOSPITAL_COMMUNITY)
Admission: EM | Admit: 2016-12-03 | Discharge: 2016-12-06 | DRG: 247 | Disposition: A | Discharge status: Home / Self Care | Payer: Medicare HMO | Attending: Interventional Cardiology | Admitting: Interventional Cardiology

## 2016-12-03 ENCOUNTER — Emergency Department (HOSPITAL_COMMUNITY): Payer: Medicare HMO

## 2016-12-03 ENCOUNTER — Encounter (HOSPITAL_COMMUNITY): Payer: Self-pay | Admitting: Cardiology

## 2016-12-03 DIAGNOSIS — I249 Acute ischemic heart disease, unspecified: Secondary | ICD-10-CM

## 2016-12-03 DIAGNOSIS — R0789 Other chest pain: Secondary | ICD-10-CM | POA: Diagnosis not present

## 2016-12-03 DIAGNOSIS — Z8673 Personal history of transient ischemic attack (TIA), and cerebral infarction without residual deficits: Secondary | ICD-10-CM

## 2016-12-03 DIAGNOSIS — I255 Ischemic cardiomyopathy: Secondary | ICD-10-CM | POA: Diagnosis present

## 2016-12-03 DIAGNOSIS — M1990 Unspecified osteoarthritis, unspecified site: Secondary | ICD-10-CM | POA: Diagnosis present

## 2016-12-03 DIAGNOSIS — I2699 Other pulmonary embolism without acute cor pulmonale: Secondary | ICD-10-CM | POA: Diagnosis present

## 2016-12-03 DIAGNOSIS — Z87891 Personal history of nicotine dependence: Secondary | ICD-10-CM

## 2016-12-03 DIAGNOSIS — I248 Other forms of acute ischemic heart disease: Secondary | ICD-10-CM | POA: Diagnosis not present

## 2016-12-03 DIAGNOSIS — I5042 Chronic combined systolic (congestive) and diastolic (congestive) heart failure: Secondary | ICD-10-CM | POA: Diagnosis not present

## 2016-12-03 DIAGNOSIS — Z823 Family history of stroke: Secondary | ICD-10-CM

## 2016-12-03 DIAGNOSIS — Z955 Presence of coronary angioplasty implant and graft: Secondary | ICD-10-CM

## 2016-12-03 DIAGNOSIS — Z7982 Long term (current) use of aspirin: Secondary | ICD-10-CM

## 2016-12-03 DIAGNOSIS — N179 Acute kidney failure, unspecified: Secondary | ICD-10-CM | POA: Diagnosis present

## 2016-12-03 DIAGNOSIS — R7989 Other specified abnormal findings of blood chemistry: Secondary | ICD-10-CM

## 2016-12-03 DIAGNOSIS — Z7984 Long term (current) use of oral hypoglycemic drugs: Secondary | ICD-10-CM

## 2016-12-03 DIAGNOSIS — Z888 Allergy status to other drugs, medicaments and biological substances status: Secondary | ICD-10-CM

## 2016-12-03 DIAGNOSIS — Z794 Long term (current) use of insulin: Secondary | ICD-10-CM

## 2016-12-03 DIAGNOSIS — I480 Paroxysmal atrial fibrillation: Secondary | ICD-10-CM | POA: Diagnosis present

## 2016-12-03 DIAGNOSIS — E119 Type 2 diabetes mellitus without complications: Secondary | ICD-10-CM | POA: Diagnosis present

## 2016-12-03 DIAGNOSIS — Z96653 Presence of artificial knee joint, bilateral: Secondary | ICD-10-CM | POA: Diagnosis present

## 2016-12-03 DIAGNOSIS — R079 Chest pain, unspecified: Secondary | ICD-10-CM | POA: Diagnosis present

## 2016-12-03 DIAGNOSIS — Z880 Allergy status to penicillin: Secondary | ICD-10-CM

## 2016-12-03 DIAGNOSIS — I130 Hypertensive heart and chronic kidney disease with heart failure and stage 1 through stage 4 chronic kidney disease, or unspecified chronic kidney disease: Secondary | ICD-10-CM | POA: Diagnosis present

## 2016-12-03 DIAGNOSIS — E1122 Type 2 diabetes mellitus with diabetic chronic kidney disease: Secondary | ICD-10-CM | POA: Diagnosis present

## 2016-12-03 DIAGNOSIS — Z885 Allergy status to narcotic agent status: Secondary | ICD-10-CM

## 2016-12-03 DIAGNOSIS — F0390 Unspecified dementia without behavioral disturbance: Secondary | ICD-10-CM | POA: Diagnosis present

## 2016-12-03 DIAGNOSIS — Z86711 Personal history of pulmonary embolism: Secondary | ICD-10-CM

## 2016-12-03 DIAGNOSIS — I25110 Atherosclerotic heart disease of native coronary artery with unstable angina pectoris: Secondary | ICD-10-CM | POA: Diagnosis not present

## 2016-12-03 DIAGNOSIS — N189 Chronic kidney disease, unspecified: Secondary | ICD-10-CM | POA: Diagnosis present

## 2016-12-03 DIAGNOSIS — G4733 Obstructive sleep apnea (adult) (pediatric): Secondary | ICD-10-CM | POA: Diagnosis present

## 2016-12-03 DIAGNOSIS — R002 Palpitations: Secondary | ICD-10-CM | POA: Diagnosis present

## 2016-12-03 DIAGNOSIS — E559 Vitamin D deficiency, unspecified: Secondary | ICD-10-CM | POA: Diagnosis present

## 2016-12-03 DIAGNOSIS — I714 Abdominal aortic aneurysm, without rupture, unspecified: Secondary | ICD-10-CM | POA: Diagnosis present

## 2016-12-03 DIAGNOSIS — R778 Other specified abnormalities of plasma proteins: Secondary | ICD-10-CM

## 2016-12-03 DIAGNOSIS — Z8249 Family history of ischemic heart disease and other diseases of the circulatory system: Secondary | ICD-10-CM

## 2016-12-03 DIAGNOSIS — E876 Hypokalemia: Secondary | ICD-10-CM | POA: Diagnosis not present

## 2016-12-03 DIAGNOSIS — E782 Mixed hyperlipidemia: Secondary | ICD-10-CM | POA: Diagnosis present

## 2016-12-03 DIAGNOSIS — I10 Essential (primary) hypertension: Secondary | ICD-10-CM | POA: Diagnosis present

## 2016-12-03 DIAGNOSIS — J449 Chronic obstructive pulmonary disease, unspecified: Secondary | ICD-10-CM | POA: Diagnosis present

## 2016-12-03 DIAGNOSIS — I429 Cardiomyopathy, unspecified: Secondary | ICD-10-CM

## 2016-12-03 DIAGNOSIS — Z9981 Dependence on supplemental oxygen: Secondary | ICD-10-CM

## 2016-12-03 HISTORY — DX: Unspecified dementia, unspecified severity, without behavioral disturbance, psychotic disturbance, mood disturbance, and anxiety: F03.90

## 2016-12-03 HISTORY — DX: Cardiomyopathy, unspecified: I42.9

## 2016-12-03 HISTORY — DX: Paroxysmal atrial fibrillation: I48.0

## 2016-12-03 LAB — BASIC METABOLIC PANEL
Anion gap: 12 (ref 5–15)
BUN: 18 mg/dL (ref 6–20)
CHLORIDE: 102 mmol/L (ref 101–111)
CO2: 26 mmol/L (ref 22–32)
CREATININE: 1.74 mg/dL — AB (ref 0.61–1.24)
Calcium: 9.3 mg/dL (ref 8.9–10.3)
GFR calc Af Amer: 43 mL/min — ABNORMAL LOW (ref >60)
GFR calc non Af Amer: 37 mL/min — ABNORMAL LOW (ref >60)
GLUCOSE: 153 mg/dL — AB (ref 65–99)
Potassium: 3.5 mmol/L (ref 3.5–5.1)
Sodium: 140 mmol/L (ref 135–145)

## 2016-12-03 LAB — I-STAT TROPONIN, ED
Troponin i, poc: 0.12 ng/mL (ref 0.00–0.08)
Troponin i, poc: 0.09 ng/mL (ref 0.00–0.08)

## 2016-12-03 LAB — TSH: TSH: 2.177 u[IU]/mL (ref 0.350–4.500)

## 2016-12-03 LAB — CBC
HCT: 42.7 % (ref 39.0–52.0)
Hemoglobin: 14.1 g/dL (ref 13.0–17.0)
MCH: 29.6 pg (ref 26.0–34.0)
MCHC: 33 g/dL (ref 30.0–36.0)
MCV: 89.7 fL (ref 78.0–100.0)
PLATELETS: 325 10*3/uL (ref 150–400)
RBC: 4.76 MIL/uL (ref 4.22–5.81)
RDW: 13.6 % (ref 11.5–15.5)
WBC: 14.6 10*3/uL — ABNORMAL HIGH (ref 4.0–10.5)

## 2016-12-03 LAB — GLUCOSE, CAPILLARY: Glucose-Capillary: 131 mg/dL — ABNORMAL HIGH (ref 65–99)

## 2016-12-03 LAB — TROPONIN I: Troponin I: 0.17 ng/mL (ref <0.03)

## 2016-12-03 MED ORDER — ACETAMINOPHEN 325 MG PO TABS: 650.0000 mg | ORAL_TABLET | ORAL | Status: DC | PRN

## 2016-12-03 MED ORDER — LOSARTAN POTASSIUM 50 MG PO TABS
100.0000 mg | ORAL_TABLET | Freq: Every day | ORAL | Status: DC
  Filled 2016-12-03: qty 2

## 2016-12-03 MED ORDER — ASPIRIN EC 81 MG PO TBEC
81.0000 mg | DELAYED_RELEASE_TABLET | Freq: Every day | ORAL | Status: DC
  Administered 2016-12-04: 81 mg via ORAL
  Filled 2016-12-03: qty 1

## 2016-12-03 MED ORDER — ACETAMINOPHEN 325 MG PO TABS
ORAL_TABLET | ORAL | Status: AC
  Administered 2016-12-03: 650 mg
  Filled 2016-12-03: qty 2

## 2016-12-03 MED ORDER — METOPROLOL TARTRATE 50 MG PO TABS: 50.0000 mg | ORAL_TABLET | Freq: Two times a day (BID) | ORAL | Status: DC

## 2016-12-03 MED ORDER — FAMOTIDINE 20 MG PO TABS
20.0000 mg | ORAL_TABLET | Freq: Two times a day (BID) | ORAL | Status: DC
  Administered 2016-12-04 – 2016-12-06 (×5): 20 mg via ORAL
  Filled 2016-12-03 (×5): qty 1

## 2016-12-03 MED ORDER — LIRAGLUTIDE 18 MG/3ML ~~LOC~~ SOPN: 1.2000 mg | PEN_INJECTOR | Freq: Every morning | SUBCUTANEOUS | Status: DC

## 2016-12-03 MED ORDER — INSULIN NPH (HUMAN) (ISOPHANE) 100 UNIT/ML ~~LOC~~ SUSP
10.0000 [IU] | Freq: Two times a day (BID) | SUBCUTANEOUS | Status: AC
  Administered 2016-12-04 (×3): 10 [IU] via SUBCUTANEOUS
  Filled 2016-12-03: qty 10

## 2016-12-03 MED ORDER — ONDANSETRON HCL 4 MG/2ML IJ SOLN: 4.0000 mg | Freq: Four times a day (QID) | INTRAMUSCULAR | Status: DC | PRN

## 2016-12-03 MED ORDER — INSULIN NPH (HUMAN) (ISOPHANE) 100 UNIT/ML ~~LOC~~ SUSP
SUBCUTANEOUS | Status: AC
  Filled 2016-12-03: qty 10

## 2016-12-03 MED ORDER — DONEPEZIL HCL 10 MG PO TABS
10.0000 mg | ORAL_TABLET | Freq: Every day | ORAL | Status: DC
  Administered 2016-12-04 – 2016-12-05 (×3): 10 mg via ORAL
  Filled 2016-12-03 (×2): qty 2
  Filled 2016-12-03: qty 1

## 2016-12-03 MED ORDER — METOPROLOL TARTRATE 25 MG PO TABS
25.0000 mg | ORAL_TABLET | Freq: Two times a day (BID) | ORAL | Status: DC
  Administered 2016-12-04 – 2016-12-06 (×6): 25 mg via ORAL
  Filled 2016-12-03 (×6): qty 1

## 2016-12-03 MED ORDER — DILTIAZEM HCL ER COATED BEADS 120 MG PO CP24
120.0000 mg | ORAL_CAPSULE | Freq: Every day | ORAL | Status: DC
  Administered 2016-12-04: 120 mg via ORAL
  Filled 2016-12-03: qty 1

## 2016-12-03 MED ORDER — ASPIRIN 81 MG PO CHEW
324.0000 mg | CHEWABLE_TABLET | Freq: Once | ORAL | Status: AC
  Administered 2016-12-03: 324 mg via ORAL
  Filled 2016-12-03: qty 4

## 2016-12-03 MED ORDER — FUROSEMIDE 40 MG PO TABS
40.0000 mg | ORAL_TABLET | Freq: Two times a day (BID) | ORAL | Status: DC
  Administered 2016-12-04: 40 mg via ORAL
  Filled 2016-12-03: qty 1

## 2016-12-03 NOTE — H&P (Signed)
History and Physical    CALVYN KURTZMAN KXF:[id number] DOB: September 10, 1941 DOA: 12/03/2016  PCP: [name], DO  Patient coming from: home   Chief Complaint:   Heart racing, sob  HPI: Rodney Orozco is a 76 y.o. male with medical history significant of pafib, HTN, AAA s/p repair 10/17, post op PE in 2016 off anticoagulation, CHF comes in with an episode of palpitations earlier tonight with associated sob and very mild chest tightness across his chest.  Pt says a similar episode happened to him last night also, he went to bed and the symptoms resolved.  He has been recovering well since his AAA repair in October.  He has not been sick, no fevers.  No swelling.  No cough.  His symptoms are all resolved now.  He has had no change in his medications recently.  He does check his pulse ox at home which also measures his heart rate and his wife says it was up to 140 earlier, and at one time it read in the 74s (his heart rate).  Pt referred for admission for his chest pain, along with is palpitations.    Review of Systems: As per HPI otherwise 10 point review of systems negative.   Past Medical History:  Diagnosis Date  . A-fib ([place])   . AAA (abdominal aortic aneurysm) ([place]) 2017  . AAA (abdominal aortic aneurysm) ([place]) 1999   /pt report on 08/31/2016  . Asthma   . Atrial fibrillation with RVR ([place])   . CHF (congestive heart failure) ([place])   . Dementia   . Dyslipidemia   . HCAP (healthcare-associated pneumonia) 12/2013   Archie Endo 01/06/2014  . Hypertension   . Mixed hyperlipidemia   . On home oxygen therapy    "2L at night" (08/31/2016)  . OSA on CPAP   . Osteoarthritis   . Pneumonia December 2014   Had hemoptysis and admitted at [facility].  . Pulmonary embolism ([place]) 12/2014   S/P knee replacement/notes 08/31/2016  . Stroke ([place])   . Type II diabetes mellitus ([place])   . Vitamin D deficiency     Past Surgical History:  Procedure Laterality Date  . ABDOMINAL AORTIC ANEURYSM  REPAIR  08/31/2016  . ABDOMINAL AORTIC ENDOVASCULAR STENT GRAFT N/A 08/31/2016   Procedure: ABDOMINAL AORTIC ENDOVASCULAR STENT GRAFT and Open Exposure left common femoral artery;  Surgeon: Angelia Mould, MD;  Location: [facility] OR;  Service: Vascular;  Laterality: N/A;  . ABDOMINAL SURGERY  1999   "for aneurysm"  . BACK SURGERY    . JOINT REPLACEMENT    . [place]  . TOTAL KNEE ARTHROPLASTY Bilateral Feb 2016 - Dec 2016     reports that he quit smoking about 3 years ago. His smoking use included Cigarettes. He has a 100.00 pack-year smoking history. He has never used smokeless tobacco. He reports that he does not drink alcohol or use drugs.  Allergies  Allergen Reactions  . Codeine Other (See Comments)    Hallucinations  . Tramadol Dermatitis  . Lipitor [Atorvastatin] Other (See Comments)    Joint pain  . Penicillins Rash    HIves/rash Has patient had a PCN reaction causing immediate rash, facial/tongue/throat swelling, SOB or lightheadedness with hypotension: Yes Has patient had a PCN reaction causing severe rash involving mucus membranes or skin necrosis: No Has patient had a PCN reaction that required hospitalization No Has patient had a PCN reaction occurring within the last 10 years: No If all of  the above answers are "NO", then may proceed with Cephalosporin use.     Family History  Problem Relation Age of Onset  . Dementia Mother   . Stroke Mother   . Heart attack Father     Prior to Admission medications   Medication Sig Start Date End Date Taking? Authorizing Provider  aspirin EC 81 MG tablet Take 81 mg by mouth daily.   Yes Historical Provider, MD  CARTIA XT 120 MG 24 hr capsule Take 120 mg by mouth daily.  09/13/16  Yes Historical Provider, MD  donepezil (ARICEPT) 10 MG tablet Take 1 tablet (10 mg total) by mouth at bedtime. 04/25/16  Yes Melvenia Beam, MD  famotidine (PEPCID) 20 MG tablet Take 1 tablet by mouth 2 (two) times daily. 06/30/16   Yes Historical Provider, MD  furosemide (LASIX) 40 MG tablet Take 1 tablet by mouth 2 (two) times daily. 06/30/16  Yes Historical Provider, MD  losartan (COZAAR) 100 MG tablet Take 100 mg by mouth daily. 02/25/16  Yes Historical Provider, MD  metFORMIN (GLUCOPHAGE) 500 MG tablet Take 1,000 mg by mouth 2 (two) times daily. 10/04/16  Yes Historical Provider, MD  metoprolol tartrate (LOPRESSOR) 25 MG tablet Take 1 tablet by mouth 2 (two) times daily. 06/30/16  Yes Historical Provider, MD  Misc. Devices MISC Diclofenac 3% Baclofen 2% lidocaine 5% menthol 1%,  Apply 1-2 gm 3-4 times daily to right knee as needed for pain 02/09/15  Yes Historical Provider, MD  NOVOLIN N RELION 100 UNIT/ML injection Inject 10 Units into the skin 2 (two) times daily.  04/13/16  Yes Historical Provider, MD  OXYGEN Inhale 2 L into the lungs at bedtime. 2lpm 24/7  DME- [facility]    Yes Historical Provider, MD  VICTOZA 18 MG/3ML SOPN Inject 1.2 mg into the skin every morning.  11/15/16  Yes Historical Provider, MD    Physical Exam: Vitals:   12/03/16 2030 12/03/16 2100 12/03/16 2130 12/03/16 2145  BP: 143/82 130/89 113/95   Pulse: 81 81 79 79  Resp: '17 26 22 22  '$ Temp:      TempSrc:      SpO2: 91% 92% 91% 93%  Weight:      Height:       Constitutional: NAD, calm, comfortable Vitals:   12/03/16 2030 12/03/16 2100 12/03/16 2130 12/03/16 2145  BP: 143/82 130/89 113/95   Pulse: 81 81 79 79  Resp: '17 26 22 22  '$ Temp:      TempSrc:      SpO2: 91% 92% 91% 93%  Weight:      Height:       Eyes: PERRL, lids and conjunctivae normal ENMT: Mucous membranes are moist. Posterior pharynx clear of any exudate or lesions.Normal dentition.  Neck: normal, supple, no masses, no thyromegaly Respiratory: clear to auscultation bilaterally, no wheezing, no crackles. Normal respiratory effort. No accessory muscle use.  Cardiovascular: Regular rate and rhythm, no murmurs / rubs / gallops. No extremity edema. 2+ pedal pulses. No carotid bruits.   Abdomen: no tenderness, no masses palpated. No hepatosplenomegaly. Bowel sounds positive.  Musculoskeletal: no clubbing / cyanosis. No joint deformity upper and lower extremities. Good ROM, no contractures. Normal muscle tone.  Skin: no rashes, lesions, ulcers. No induration Neurologic: CN 2-12 grossly intact. Sensation intact, DTR normal. Strength 5/5 in all 4.  Psychiatric: Normal judgment and insight. Alert and oriented x 3. Normal mood.    Labs on Admission: I have personally reviewed following labs and imaging studies  CBC:  Recent Labs Lab 12/03/16 1738  WBC 14.6*  HGB 14.1  HCT 42.7  MCV 89.7  PLT 096   Basic Metabolic Panel:  Recent Labs Lab 12/03/16 1738  NA 140  K 3.5  CL 102  CO2 26  GLUCOSE 153*  BUN 18  CREATININE 1.74*  CALCIUM 9.3   GFR: Estimated Creatinine Clearance: 45.7 mL/min (by C-G formula based on SCr of 1.74 mg/dL (H)).  Urine analysis:    Component Value Date/Time   COLORURINE YELLOW 08/29/2016 [place] 08/29/2016 1333   LABSPEC 1.006 08/29/2016 1333   PHURINE 6.5 08/29/2016 1333   GLUCOSEU NEGATIVE 08/29/2016 1333   HGBUR NEGATIVE 08/29/2016 1333   BILIRUBINUR NEGATIVE 08/29/2016 1333   KETONESUR NEGATIVE 08/29/2016 1333   PROTEINUR NEGATIVE 08/29/2016 1333   NITRITE NEGATIVE 08/29/2016 1333   LEUKOCYTESUR NEGATIVE 08/29/2016 1333     Radiological Exams on Admission: Dg Chest 2 View  Result Date: 12/03/2016 CLINICAL DATA:  Shortness of breath, arrhythmia EXAM: CHEST  2 VIEW COMPARISON:  08/31/2016 FINDINGS: Borderline cardiomegaly. Central mild vascular congestion without pulmonary edema. Calcified pleural and diaphragmatic plaques again noted. No segmental infiltrate. Elevation of the right hemidiaphragm again noted. Osteopenia and mild degenerative changes thoracic spine. IMPRESSION: Central mild vascular congestion without pulmonary edema. Calcified pleural and diaphragmatic plaques again noted. No segmental  infiltrate. Elevation of the right hemidiaphragm again noted. Osteopenia and mild degenerative changes thoracic spine. Electronically Signed   By: Lahoma Crocker M.D.   On: 12/03/2016 19:00    EKG: Independently reviewed. nsr no acute issues cxr reviewed no edema or infiltrate Old records reviewed   Assessment/Plan 76 yo male with h/o CHF, pafib on aspirin, COPD comes in with chest pressure during episodes of palpitations with mild elevation of his troponin  Principal Problem:   Chest pain-  Per history sounds associated with probable episodes of afib with rvr (tachy brady syndrome?).  Trop is mildly elevated which may be due to bump in his renal function.  Currently chest pain free.  Will serial trop overnight.  Dr turner cardiologist at [facility] was called and recommended cardiac work up here at [facility].  Consult cardiology here in the am.  Check cardiac echo in the am.    Active Problems:   Paroxysmal atrial fibrillation ([place])- currently in NSR, monitor on tele overnight.  Will not adjust his meds at this time and monitor what his HR does while he is here.  Will check TSH level also.    Palpitations- as above, check thyroid studies    Pulmonary embolism without acute cor pulmonale ([place]) 2016 post op treated for a year- noted    COPD GOLD III- noted    AAA (abdominal aortic aneurysm) ([place]) s/p repair in oct 2017 by dr Scot Dock- healing well    Chronic combined systolic and diastolic CHF (congestive heart failure) ([place])- stable and compensated at this time    AKI (acute kidney injury) ([place]) vs CKD- cr bump up to 1.7 today vs 0.8 back in October 2017.  Monitor.  Pt euvolemic will hold off on any ivf at this time and repeat level in am due to his chf history    DM - hold metformin.  Cont home insulin regimen  DVT prophylaxis:  scds Code Status:    full Family Communication:   Wife and 2 daughters at bedside, all questions answered Disposition Plan:   Per day team Consults called:     Cardiology consult placed in  epic Admission status:  Observation status   DAVID,RACHAL A MD [facility]  If 7PM-7AM, please contact night-coverage [email or web address] Password [email or web address]  12/03/2016, 10:05 PM

## 2016-12-03 NOTE — ED Notes (Signed)
Call to floor [place], RN will call back

## 2016-12-03 NOTE — ED Triage Notes (Addendum)
Pt states his heart rate "all over the place"  C/o some off and on chest pain since last night.  In hospital in august and diagnosed with atrial fib.   Taken off xarelto in November r/t surgery on AAA.  On low dose aspirin.

## 2016-12-03 NOTE — ED Provider Notes (Signed)
[facility] DEPT Provider Note   CSN: [id number] Arrival date & time: 12/03/16  1716     History   Chief Complaint Chief Complaint  Patient presents with  . Palpitations    HPI LYDIA MENG is a 76 y.o. male.   Palpitations   This is a recurrent problem. Episode onset: several yrs. Episode frequency: frequent over the last few days. The problem has been resolved. Episode Length: several seconds to 30 minutes. Associated symptoms include malaise/fatigue and chest pain. Pertinent negatives include no chest pressure, no irregular heartbeat, no nausea, no lower extremity edema, no hemoptysis and no shortness of breath. He has tried nothing for the symptoms. Risk factors: h/o paroxysmal afib (off of Xarelto due to recent AAA sx)  Chest Pain   This is a recurrent problem. Episode frequency: prior to palpitations. The problem has been resolved. The pain is present in the lateral region. The pain is mild. The quality of the pain is described as sharp. The pain does not radiate. Episode Length: a few seconds. Associated symptoms include malaise/fatigue and palpitations. Pertinent negatives include no hemoptysis, no irregular heartbeat, no lower extremity edema, no nausea and no shortness of breath.  His past medical history is significant for arrhythmia, CHF, diabetes, hyperlipidemia, hypertension and PE.  Pertinent negatives for past medical history include no CAD.  Procedure history is negative for cardiac catheterization.    Past Medical History:  Diagnosis Date  . A-fib ([place])   . AAA (abdominal aortic aneurysm) ([place]) 2017  . AAA (abdominal aortic aneurysm) ([place]) 1999   /pt report on 08/31/2016  . Asthma   . Atrial fibrillation with RVR ([place])   . CHF (congestive heart failure) ([place])   . Dementia   . Dyslipidemia   . HCAP (healthcare-associated pneumonia) 12/2013   Archie Endo 01/06/2014  . Hypertension   . Mixed hyperlipidemia   . On home oxygen therapy    "2L at night"  (08/31/2016)  . OSA on CPAP   . Osteoarthritis   . Pneumonia December 2014   Had hemoptysis and admitted at [facility].  . Pulmonary embolism ([place]) 12/2014   S/P knee replacement/notes 08/31/2016  . Stroke ([place])   . Type II diabetes mellitus ([place])   . Vitamin D deficiency     Patient Active Problem List   Diagnosis Date Noted  . Chest pain 12/03/2016  . Palpitations 12/03/2016  . Chronic combined systolic and diastolic CHF (congestive heart failure) ([place]) 12/03/2016  . AAA (abdominal aortic aneurysm) [facility]) s/p repair in oct 2017 by dr Scot Dock 08/31/2016  . Long-term (current) use of anticoagulants 08/25/2016  . Central sleep apnea 08/08/2016  . COPD GOLD III 07/21/2016  . Paroxysmal atrial fibrillation ([place]) 07/12/2016  . Left ventricular dysfunction 07/12/2016  . Acute combined systolic and diastolic heart failure ([place]) 07/12/2016  . Abdominal aortic aneurysm (AAA) ([place]) 07/12/2016  . Cognitive changes 04/25/2016  . Gastroesophageal reflux disease without esophagitis 07/29/2015  . Morbid obesity due to excess calories ([place]) 05/13/2015  . Primary osteoarthritis of right knee 02/09/2015  . Pulmonary embolism without acute cor pulmonale ([place]) 2016 post op treated for a year 02/09/2015  . Postoperative pulmonary embolism ([place]) 01/21/2015  . DM type 2 with diabetic dyslipidemia ([place]) 08/18/2014  . Other and unspecified hyperlipidemia 04/27/2014  . Influenza B 01/07/2014  . Streptococcus pneumoniae pneumonia ([place]) 01/06/2014  . Acute respiratory failure with hypoxia ([place]) 01/06/2014  . COPD with acute exacerbation ([place]) 01/06/2014  . Healthcare-associated pneumonia 01/06/2014  . Diabetes  mellitus without complication ([place])   . Hypertension   . Dyslipidemia     Past Surgical History:  Procedure Laterality Date  . ABDOMINAL AORTIC ANEURYSM REPAIR  08/31/2016  . ABDOMINAL AORTIC ENDOVASCULAR STENT GRAFT N/A 08/31/2016   Procedure: ABDOMINAL AORTIC ENDOVASCULAR STENT GRAFT  and Open Exposure left common femoral artery;  Surgeon: Angelia Mould, MD;  Location: [facility] OR;  Service: Vascular;  Laterality: N/A;  . ABDOMINAL SURGERY  1999   "for aneurysm"  . BACK SURGERY    . JOINT REPLACEMENT    . [place]  . TOTAL KNEE ARTHROPLASTY Bilateral Feb 2016 - Dec 2016       Home Medications    Prior to Admission medications   Medication Sig Start Date End Date Taking? Authorizing Provider  aspirin EC 81 MG tablet Take 81 mg by mouth daily.   Yes Historical Provider, MD  CARTIA XT 120 MG 24 hr capsule Take 120 mg by mouth daily.  09/13/16  Yes Historical Provider, MD  donepezil (ARICEPT) 10 MG tablet Take 1 tablet (10 mg total) by mouth at bedtime. 04/25/16  Yes Melvenia Beam, MD  famotidine (PEPCID) 20 MG tablet Take 1 tablet by mouth 2 (two) times daily. 06/30/16  Yes Historical Provider, MD  furosemide (LASIX) 40 MG tablet Take 1 tablet by mouth 2 (two) times daily. 06/30/16  Yes Historical Provider, MD  losartan (COZAAR) 100 MG tablet Take 100 mg by mouth daily. 02/25/16  Yes Historical Provider, MD  metFORMIN (GLUCOPHAGE) 500 MG tablet Take 1,000 mg by mouth 2 (two) times daily. 10/04/16  Yes Historical Provider, MD  metoprolol tartrate (LOPRESSOR) 25 MG tablet Take 1 tablet by mouth 2 (two) times daily. 06/30/16  Yes Historical Provider, MD  Misc. Devices MISC Diclofenac 3% Baclofen 2% lidocaine 5% menthol 1%,  Apply 1-2 gm 3-4 times daily to right knee as needed for pain 02/09/15  Yes Historical Provider, MD  NOVOLIN N RELION 100 UNIT/ML injection Inject 10 Units into the skin 2 (two) times daily.  04/13/16  Yes Historical Provider, MD  OXYGEN Inhale 2 L into the lungs at bedtime. 2lpm 24/7  DME- [facility]    Yes Historical Provider, MD  VICTOZA 18 MG/3ML SOPN Inject 1.2 mg into the skin every morning.  11/15/16  Yes Historical Provider, MD    Family History Family History  Problem Relation Age of Onset  . Dementia Mother   . Stroke Mother   .  Heart attack Father     Social History Social History  Substance Use Topics  . Smoking status: Former Smoker    Packs/day: 2.00    Years: 50.00    Types: Cigarettes    Quit date: 11/07/2013  . Smokeless tobacco: Never Used  . Alcohol use No     Allergies   Codeine; Tramadol; Lipitor [atorvastatin]; and Penicillins   Review of Systems Review of Systems  Constitutional: Positive for malaise/fatigue.  HENT: Positive for congestion.   Respiratory: Negative for hemoptysis and shortness of breath.   Cardiovascular: Positive for chest pain and palpitations.  Gastrointestinal: Negative for nausea.  Ten systems are reviewed and are negative for acute change except as noted in the HPI    Physical Exam Updated Vital Signs BP 137/83 (BP Location: Right Arm)   Pulse 96   Temp 97.6 F (36.4 C) (Oral)   Resp 20   Ht '5\' 10"'$  (1.778 m)   Wt 230 lb (104.3 kg)   SpO2 91%  BMI 33.00 kg/m   Physical Exam  Constitutional: He is oriented to person, place, and time. He appears well-developed and well-nourished. No distress.  HENT:  Head: Normocephalic and atraumatic.  Nose: Nose normal.  Eyes: Conjunctivae and EOM are normal. Pupils are equal, round, and reactive to light. Right eye exhibits no discharge. Left eye exhibits no discharge. No scleral icterus.  Neck: Normal range of motion. Neck supple. No JVD present.  Cardiovascular: Normal rate and regular rhythm.  Exam reveals no gallop and no friction rub.   No murmur heard. Pulmonary/Chest: Effort normal and breath sounds normal. No stridor. No respiratory distress. He has no rales.  Abdominal: Soft. He exhibits no distension. There is no tenderness.  Musculoskeletal: He exhibits no edema or tenderness.  No peripheral edema   Neurological: He is alert and oriented to person, place, and time.  Skin: Skin is warm and dry. No rash noted. He is not diaphoretic. No erythema.  Psychiatric: He has a normal mood and affect.  Vitals  reviewed.    ED Treatments / Results  Labs (all labs ordered are listed, but only abnormal results are displayed) Labs Reviewed  BASIC METABOLIC PANEL - Abnormal; Notable for the following:       Result Value   Glucose, Bld 153 (*)    Creatinine, Ser 1.74 (*)    GFR calc non Af Amer 37 (*)    GFR calc Af Amer 43 (*)    All other components within normal limits  CBC - Abnormal; Notable for the following:    WBC 14.6 (*)    All other components within normal limits  I-STAT TROPOININ, ED - Abnormal; Notable for the following:    Troponin i, poc 0.12 (*)    All other components within normal limits  I-STAT TROPOININ, ED - Abnormal; Notable for the following:    Troponin i, poc 0.09 (*)    All other components within normal limits    EKG  EKG Interpretation  Date/Time:  Sunday December 03 2016 17:28:05 EST Ventricular Rate:  96 PR Interval:    QRS Duration: 114 QT Interval:  373 QTC Calculation: 472 R Axis:   -36 Text Interpretation:  Sinus rhythm Borderline IVCD with LAD RSR' in V1 or V2, right VCD or RVH No significant change since last tracing Confirmed by [facility] MD, PEDRO [phone]) on 12/03/2016 7:03:07 PM       Radiology Dg Chest 2 View  Result Date: 12/03/2016 CLINICAL DATA:  Shortness of breath, arrhythmia EXAM: CHEST  2 VIEW COMPARISON:  08/31/2016 FINDINGS: Borderline cardiomegaly. Central mild vascular congestion without pulmonary edema. Calcified pleural and diaphragmatic plaques again noted. No segmental infiltrate. Elevation of the right hemidiaphragm again noted. Osteopenia and mild degenerative changes thoracic spine. IMPRESSION: Central mild vascular congestion without pulmonary edema. Calcified pleural and diaphragmatic plaques again noted. No segmental infiltrate. Elevation of the right hemidiaphragm again noted. Osteopenia and mild degenerative changes thoracic spine. Electronically Signed   By: Lahoma Crocker M.D.   On: 12/03/2016 19:00    Procedures Procedures  (including critical care time)  Medications Ordered in ED Medications  aspirin chewable tablet 324 mg (324 mg Oral Given 12/03/16 1905)  acetaminophen (TYLENOL) 325 MG tablet (650 mg  Given 12/03/16 2119)     Initial Impression / Assessment and Plan / ED Course  I have reviewed the triage vital signs and the nursing notes.  Pertinent labs & imaging results that were available during my care of the patient were reviewed  by me and considered in my medical decision making (see chart for details).     Atypical chest pain likely secondary to paroxysmal tachycardia. Patient is currently asymptomatic. EKG with normal sinus rhythm and no evidence of acute ischemic changes. Chest x-ray without evidence suggestive of pneumonia, pneumothorax, pneumomediastinum.  No abnormal contour of the mediastinum to suggest dissection. No evidence of acute injuries. Initial troponin elevated at 0.12. Discussed case with Dr. Radford Pax, cardiology at [facility], who recommended repeat troponin here in the emergency department. If troponin is rising she requested that we contact her and transfer the patient. If the troponin is stable or trending down she requested that we admit the patient to the hospitalist service here at [facility] and have the cardiologist evaluate the patient in the morning. Second troponin trending down 0.09.  Patient does have a history of pulmonary embolisms not currently on anticoagulation; however he does not have any lower extremity edema or pain that would be concerning for DVTs. Previous DVT was provoked following knee surgery.  Presentation is not classic for aortic dissection or esophageal perforation.  Discussed case with hospitalist who will admit the patient.   Final Clinical Impressions(s) / ED Diagnoses   Final diagnoses:  Palpitations  Chest pain, unspecified type      Fatima Blank, MD 12/03/16 2132

## 2016-12-03 NOTE — ED Notes (Signed)
Dr. Leonette Monarch notified about 0.09 troponin

## 2016-12-03 NOTE — ED Notes (Addendum)
Dr. Leonette Monarch notified about critical 0.12 Troponin

## 2016-12-03 NOTE — ED Notes (Signed)
Report to [place], [place]

## 2016-12-04 ENCOUNTER — Observation Stay (HOSPITAL_BASED_OUTPATIENT_CLINIC_OR_DEPARTMENT_OTHER): Payer: Medicare HMO

## 2016-12-04 ENCOUNTER — Encounter (HOSPITAL_COMMUNITY): Payer: Self-pay | Admitting: Cardiology

## 2016-12-04 DIAGNOSIS — I480 Paroxysmal atrial fibrillation: Secondary | ICD-10-CM

## 2016-12-04 DIAGNOSIS — R0789 Other chest pain: Secondary | ICD-10-CM | POA: Diagnosis not present

## 2016-12-04 DIAGNOSIS — R9431 Abnormal electrocardiogram [ECG] [EKG]: Secondary | ICD-10-CM

## 2016-12-04 DIAGNOSIS — N179 Acute kidney failure, unspecified: Secondary | ICD-10-CM | POA: Diagnosis not present

## 2016-12-04 DIAGNOSIS — R748 Abnormal levels of other serum enzymes: Secondary | ICD-10-CM | POA: Diagnosis not present

## 2016-12-04 DIAGNOSIS — I429 Cardiomyopathy, unspecified: Secondary | ICD-10-CM

## 2016-12-04 DIAGNOSIS — I714 Abdominal aortic aneurysm, without rupture: Secondary | ICD-10-CM | POA: Diagnosis not present

## 2016-12-04 LAB — GLUCOSE, CAPILLARY
GLUCOSE-CAPILLARY: 115 mg/dL — AB (ref 65–99)
GLUCOSE-CAPILLARY: 140 mg/dL — AB (ref 65–99)
GLUCOSE-CAPILLARY: 139 mg/dL — AB (ref 65–99)
Glucose-Capillary: 134 mg/dL — ABNORMAL HIGH (ref 65–99)

## 2016-12-04 LAB — BASIC METABOLIC PANEL
ANION GAP: 11 (ref 5–15)
BUN: 18 mg/dL (ref 6–20)
CALCIUM: 8.9 mg/dL (ref 8.9–10.3)
CO2: 28 mmol/L (ref 22–32)
CREATININE: 1.71 mg/dL — AB (ref 0.61–1.24)
Chloride: 101 mmol/L (ref 101–111)
GFR, EST AFRICAN AMERICAN: 44 mL/min — AB (ref >60)
GFR, EST NON AFRICAN AMERICAN: 38 mL/min — AB (ref >60)
Glucose, Bld: 105 mg/dL — ABNORMAL HIGH (ref 65–99)
Potassium: 2.9 mmol/L — ABNORMAL LOW (ref 3.5–5.1)
SODIUM: 140 mmol/L (ref 135–145)

## 2016-12-04 LAB — TROPONIN I
Troponin I: 0.17 ng/mL (ref <0.03)
Troponin I: 0.16 ng/mL (ref <0.03)

## 2016-12-04 LAB — ECHOCARDIOGRAM COMPLETE
HEIGHTINCHES: 70 in
WEIGHTICAEL: 3689.62 [oz_av]

## 2016-12-04 MED ORDER — INSULIN ASPART 100 UNIT/ML ~~LOC~~ SOLN
0.0000 [IU] | Freq: Three times a day (TID) | SUBCUTANEOUS | Status: DC
  Administered 2016-12-04 (×2): 1 [IU] via SUBCUTANEOUS
  Administered 2016-12-05: 2 [IU] via SUBCUTANEOUS
  Administered 2016-12-05: 1 [IU] via SUBCUTANEOUS
  Administered 2016-12-06 (×2): 3 [IU] via SUBCUTANEOUS

## 2016-12-04 MED ORDER — SODIUM CHLORIDE 0.9 % IV SOLN
INTRAVENOUS | Status: AC
  Administered 2016-12-04 (×2): via INTRAVENOUS

## 2016-12-04 MED ORDER — POTASSIUM CHLORIDE CRYS ER 20 MEQ PO TBCR
40.0000 meq | EXTENDED_RELEASE_TABLET | Freq: Two times a day (BID) | ORAL | Status: AC
  Administered 2016-12-04 (×2): 40 meq via ORAL
  Filled 2016-12-04 (×2): qty 2

## 2016-12-04 NOTE — Care Management Obs Status (Signed)
[facility] NOTIFICATION   Patient Details  Name: Rodney Orozco MRN: [id number] Date of Birth: [birth year]   Medicare Observation Status Notification Given:  Yes    Jazzmine Kleiman, Chauncey Reading, RN 12/04/2016, 11:25 AM

## 2016-12-04 NOTE — Progress Notes (Signed)
Dr. Sarajane Jews paged for notification of last Troponin 0.16, previously 0.17. Estill Dooms, LPN also aware.

## 2016-12-04 NOTE — Consult Note (Signed)
Cardiology Consultation   Patient ID: Rodney Orozco; [id number]; [birth year]   Admit date: 12/03/2016 Date of Consult: 12/04/2016  Referring MD:  Dr. Sarajane Jews Primary Cardiologist: Dr. Gwenlyn Found Consulting Cardiologist: Dr. Domenic Polite  Patient Care Team: [name], DO as PCP - General Lorretta Harp, MD as Consulting Physician (Cardiology)    Reason for Consultation: Chest pain palpitations and positive troponins   History of Present Illness: Rodney Orozco is a 76 y.o. male with a hx of Endovascular repair of abdominal aortic aneurysm 08/2016. Had PAF during hospitalization Xarelto stopped after AAA repair [facility] center during COPD exacerbation. 2Decho LVEF 40-45% with mild to mod RVD. Nuclear stress 08/2016 LVEF 41% no ischemia intermediate risk. History of PE after r TKR 12/2104 on Coumadin for a year. 100 pk years smoking quit 5 yrs ago, HTN, HLD intolerant to statins and OSA on bipap.  Patient says Saturday night his heart started racing and kept him up all night. Last night it returned and developed chest pressure through whole chest. On arrival to ER in NSR. Pulse ox at home HR 140/m. Patient says he has frequent chest tightness with walking around his yard. Not sure how long it's been going on but occurs about every other day. Doesn't push himself b/c of COPD. Troponins flat 0.017, 0.016.   Past Medical History:  Diagnosis Date  . AAA (abdominal aortic aneurysm) ([place]) 2017  . Asthma   . Cardiomyopathy ([place])   . Dementia   . Dyslipidemia   . HCAP (healthcare-associated pneumonia) 12/2013   01/06/2014  . Hypertension   . Mixed hyperlipidemia   . On home oxygen therapy    "2L at night" (08/31/2016)  . OSA on CPAP   . Osteoarthritis   . Paroxysmal atrial fibrillation (HCC)   . Pneumonia December 2014   Had hemoptysis and admitted at [facility].  . Pulmonary embolism ([place]) 12/2014   S/P knee replacement/notes 08/31/2016  . Stroke ([place])   . Type II  diabetes mellitus ([place])   . Vitamin D deficiency     Past Surgical History:  Procedure Laterality Date  . ABDOMINAL AORTIC ANEURYSM REPAIR  08/31/2016  . ABDOMINAL AORTIC ENDOVASCULAR STENT GRAFT N/A 08/31/2016   Procedure: ABDOMINAL AORTIC ENDOVASCULAR STENT GRAFT and Open Exposure left common femoral artery;  Surgeon: Angelia Mould, MD;  Location: [facility] OR;  Service: Vascular;  Laterality: N/A;  . ABDOMINAL SURGERY  1999   "for aneurysm"  . BACK SURGERY    . JOINT REPLACEMENT    . [place]  . TOTAL KNEE ARTHROPLASTY Bilateral Feb 2016 - Dec 2016      Home Meds: Prior to Admission medications   Medication Sig Start Date End Date Taking? Authorizing Provider  aspirin EC 81 MG tablet Take 81 mg by mouth daily.   Yes Historical Provider, MD  CARTIA XT 120 MG 24 hr capsule Take 120 mg by mouth daily.  09/13/16  Yes Historical Provider, MD  donepezil (ARICEPT) 10 MG tablet Take 1 tablet (10 mg total) by mouth at bedtime. 04/25/16  Yes Melvenia Beam, MD  famotidine (PEPCID) 20 MG tablet Take 1 tablet by mouth 2 (two) times daily. 06/30/16  Yes Historical Provider, MD  furosemide (LASIX) 40 MG tablet Take 1 tablet by mouth 2 (two) times daily. 06/30/16  Yes Historical Provider, MD  losartan (COZAAR) 100 MG tablet Take 100 mg by mouth daily. 02/25/16  Yes Historical Provider, MD  metFORMIN (GLUCOPHAGE) 500 MG  tablet Take 1,000 mg by mouth 2 (two) times daily. 10/04/16  Yes Historical Provider, MD  metoprolol tartrate (LOPRESSOR) 25 MG tablet Take 1 tablet by mouth 2 (two) times daily. 06/30/16  Yes Historical Provider, MD  Misc. Devices MISC Diclofenac 3% Baclofen 2% lidocaine 5% menthol 1%,  Apply 1-2 gm 3-4 times daily to right knee as needed for pain 02/09/15  Yes Historical Provider, MD  NOVOLIN N RELION 100 UNIT/ML injection Inject 10 Units into the skin 2 (two) times daily.  04/13/16  Yes Historical Provider, MD  OXYGEN Inhale 2 L into the lungs at bedtime. 2lpm 24/7    DME- [employer]    Yes Historical Provider, MD  VICTOZA 18 MG/3ML SOPN Inject 1.2 mg into the skin every morning.  11/15/16  Yes Historical Provider, MD    Current Medications: . aspirin EC  81 mg Oral Daily  . diltiazem  120 mg Oral Daily  . donepezil  10 mg Oral QHS  . famotidine  20 mg Oral BID  . insulin aspart  0-9 Units Subcutaneous TID WC  . insulin NPH Human  10 Units Subcutaneous BID AC & HS  . metoprolol tartrate  25 mg Oral BID  . potassium chloride  40 mEq Oral BID     Allergies:    Allergies  Allergen Reactions  . Codeine Other (See Comments)    Hallucinations  . Tramadol Dermatitis  . Lipitor [Atorvastatin] Other (See Comments)    Joint pain  . Penicillins Rash    HIves/rash Has patient had a PCN reaction causing immediate rash, facial/tongue/throat swelling, SOB or lightheadedness with hypotension: Yes Has patient had a PCN reaction causing severe rash involving mucus membranes or skin necrosis: No Has patient had a PCN reaction that required hospitalization No Has patient had a PCN reaction occurring within the last 10 years: No If all of the above answers are "NO", then may proceed with Cephalosporin use.     Social History:   The patient  reports that he quit smoking about 3 years ago. His smoking use included Cigarettes. He has a 100.00 pack-year smoking history. He has never used smokeless tobacco. He reports that he does not drink alcohol or use drugs.    Family History:   The patient's family history includes Dementia in his mother; Heart attack in his father; Stroke in his mother.   ROS:  Please see the history of present illness.  Review of Systems  Constitution: Positive for weakness and malaise/fatigue.  Cardiovascular: Positive for chest pain, dyspnea on exertion, irregular heartbeat and palpitations.  Respiratory: Positive for shortness of breath and sleep disturbances due to breathing.   Musculoskeletal: Positive for muscle weakness and myalgias.    All other ROS reviewed and negative.      Vital Signs: Blood pressure (!) 172/94, pulse 80, temperature 97.8 F (36.6 C), temperature source Oral, resp. rate 18, height '5\' 10"'$  (1.778 m), weight 230 lb 9.6 oz (104.6 kg), SpO2 95 %.   PHYSICAL EXAM: General:  Well nourished, well developed, in no acute distress  HEENT: normal Lymph: no adenopathy Neck: no JVD Endocrine:  No thryomegaly Vascular: No carotid bruits; FA pulses 2+ bilaterally without bruits  Cardiac:  RRR; normal S1, S2; 2/6 systolic murmur LSB, no rub, bruit, thrill, or heave Lungs: Decreased breath sounds with fine crackles at bases.  Abd: soft, nontender, no hepatomegaly  Ext: no edema, Good distal pulses bilaterally Musculoskeletal:  No deformities, BUE and BLE strength normal and equal Skin:  warm and dry  Neuro:  CNs 2-12 intact, no focal abnormalities noted Psych:  Normal affect    EKG:  NSR with nonspecific ST changes.  Telemetry: NSR  Labs:  Recent Labs  12/03/16 2226 12/04/16 0140 12/04/16 0454  TROPONINI 0.17* 0.17* 0.16*   Lab Results  Component Value Date   WBC 14.6 (H) 12/03/2016   HGB 14.1 12/03/2016   HCT 42.7 12/03/2016   MCV 89.7 12/03/2016   PLT 325 12/03/2016    Recent Labs Lab 12/04/16 0454  NA 140  K 2.9*  CL 101  CO2 28  BUN 18  CREATININE 1.71*  CALCIUM 8.9  GLUCOSE 105*   No results found for: CHOL, HDL, LDLCALC, TRIG No results found for: DDIMER  Radiology/Studies:  Dg Chest 2 View  Result Date: 12/03/2016 CLINICAL DATA:  Shortness of breath, arrhythmia EXAM: CHEST  2 VIEW COMPARISON:  08/31/2016 FINDINGS: Borderline cardiomegaly. Central mild vascular congestion without pulmonary edema. Calcified pleural and diaphragmatic plaques again noted. No segmental infiltrate. Elevation of the right hemidiaphragm again noted. Osteopenia and mild degenerative changes thoracic spine. IMPRESSION: Central mild vascular congestion without pulmonary edema. Calcified pleural and  diaphragmatic plaques again noted. No segmental infiltrate. Elevation of the right hemidiaphragm again noted. Osteopenia and mild degenerative changes thoracic spine. Electronically Signed   By: Lahoma Crocker M.D.   On: 12/03/2016 19:00     PROBLEM LIST:  Principal Problem:   Chest pain Active Problems:   Pulmonary embolism without acute cor pulmonale ([place]) 2016 post op treated for a year   Paroxysmal atrial fibrillation ([place])   COPD GOLD III   AAA (abdominal aortic aneurysm) ([place]) s/p repair in oct 2017 by dr Scot Dock   Palpitations   Chronic combined systolic and diastolic CHF (congestive heart failure) ([place])   AKI (acute kidney injury) ([place]) vs CKD   Study Highlights   The left ventricular ejection fraction is moderately decreased (30-44%).  Nuclear stress EF: 41%.  There was no ST segment deviation noted during stress.  This is an intermediate risk study due to reduced systolic function.  No evidence of ischemia.       ASSESSMENT AND PLAN:  Chest pain worrisome for ischemia, LVD EF 40 on echo and nuclear stress test. Suspect has underlying CAD. Should probably have cath. Troponins flat suspect demand ischemia secondary to tachyarrythmias.  PAF with rapid HR prior to admission. CHADSVASC=7. Should be anticoagulated but will hold off if we do cath this admission. On diltriazem and metoprolol.  Cardiomyopathy EF 40-45% ? Ischemic  AAA repair 08/2016  Pulmonary Embolism after TKR 2016 on Coumad x 68yr COPD 100 pk yrs quit 5 yrs ago  OSA on bipap  HTN  HLD intolerant to statins.  Crt 1.71  Signed, MErmalinda BarriosPA-C 12/04/2016 12:12 PM    Attending note:  Patient seen and examined. Reviewed extensive records and discussed the case with Ms. LBonnell PublicPA-C. Rodney Orozco to the hospital for evaluation of palpitations and also recurring exertional chest tightness. He has a history of previously documented PAF, had been on Xarelto up until December 2017 although  it sounds like therapy was stopped, he was also having trouble affording it, no complications however. He also has a history of cardiomyopathy with LVEF 41% by Myoview in September 2017, no active ischemia documented at that time. This procedure was done as part of a creamy operative evaluation prior to endovascular AAA repair. He has additional cardiovascular risk factors including long-standing tobacco abuse history  with COPD, hypertension, hyperlipidemia, and family history of premature CAD.  On examination he appears comfortable. Heart rate in the 70s to 80s in sinus rhythm by telemetry. Systolic blood pressure ranging 130 to 170. Lungs exhibit decreased breath sounds throughout without wheezing. Cardiac exam reveals distant regular heart sounds with soft systolic murmur. Lab work shows creatinine 1.7 which is increased over his baseline, troponin I levels 0.17 in flat pattern. I personally reviewed the tracing from 12/03/2016 which shows sinus rhythm with IVCD and nonspecific ST changes. Chest x-ray from 12/03/2016 showed mild vascular congestion.  Patient presents with symptoms suggestive of angina although with complex medical history including cardiomyopathy and what sounds like recurring paroxysmal atrial fibrillation. His CHADSVASC score is 7. Current cardiac markers trend argues against ACS. He has clearly defined atherosclerosis at baseline. Plan at this point is to transfer him to [facility] for a left and right heart catheterization to better clarify status of his cardiomyopathy and potential ischemic contributors. Once this is better outlined in medication adjustments are made, he can then be considered for resumption of anticoagulation to reduce stroke risk with recurring PAF. Follow-up echocardiogram is pending at this time. We will gently hydrate him with follow-up BMET prior to anticipated cardiac catheterization tomorrow.  Satira Sark, M.D., F.A.C.C.

## 2016-12-04 NOTE — Progress Notes (Signed)
*  PRELIMINARY RESULTS* Echocardiogram 2D Echocardiogram has been performed.  Rodney Orozco 12/04/2016, 2:02 PM

## 2016-12-04 NOTE — Progress Notes (Signed)
Assumed care of patient. Pt lying in bed, NAD noted his family is at the bedside. He denies any pain, questions or concerns at this time.

## 2016-12-04 NOTE — Progress Notes (Signed)
PROGRESS NOTE  Rodney Orozco RXV:[id number] DOB: Jan 14, 1941 DOA: 12/03/2016 PCP: [name], DO  Brief Narrative: 76 year old man PMH paroxysmal atrial fibrillation off anticoagulation status post AAA repair October 2017, history PE 2016 provoked by any surgery, presented with palpitations, shortness of breath, chest pain. Admitted for chest pain, elevated troponin, suspect atrial fibrillation with rapid ventricular response, cardiology consultation and echocardiogram.  Assessment/Plan 1. Chest chest pain with elevated troponin, concerning for ischemia. Troponins flat. EKG nonacute. Demand ischemia secondary to tachyarrhythmia suspected. Cardiology has recommended transfer to [facility] for left heart catheterization.  2. PMH PAF with CHA2DS2-VASc 7. Defer anticoagulation and management to cardiology.  3. Acute kidney injury. No significant change.   Hold losartan and Lasix today. No evidence of volume overload.  Trial of IV fluids, monitor for volume overload, known LVEF approximately 40%. 4. Hypokalemia.  Replete 5. S/p AAA repair  6. DM type 2, metformin on hold, hold Victoza until after LHC. Hold long-acting insulin in AM. Blood sugars stable. 7. PMH PE, dementia, nocturnal oxygen, COPD, OSA on CPAP, chronic systolic/diastolic CHF   Appears stable at this point. Defer management to cardiology, agree with transfer to [facility] for further evaluation.  Trial of IV fluids, monitor for volume overload. Repeat BMP in the morning.  Replete potassium.  Hold long-acting insulin in the morning pending catheterization. Continue blood sugar checks.  DVT prophylaxis: SCDs Code Status: full code Family Communication: wife at bedside Disposition Plan: to [facility]   Murray Hodgkins, MD  [facility] Direct contact: [phone] --[place] [place]  --[email or web address]; password [email or web address]  7PM-7AM contact night coverage as above 12/04/2016, 10:21 AM  LOS: 0 days   Consultants:  Cardiology    Procedures:    Antimicrobials:    Interval history/Subjective: No chest pain or shortness of breath now. Feels okay.  Objective: Vitals:   12/03/16 2230 12/04/16 0245 12/04/16 0645 12/04/16 0656  BP: (!) 145/87 131/70 (!) 170/77 (!) 173/79  Pulse: 74 77 80 75  Resp: '20 20 20   '$ Temp: 98.1 F (36.7 C) 98 F (36.7 C) 98.2 F (36.8 C)   TempSrc: Oral Oral Oral   SpO2: 95% 99% 94%   Weight: 103.4 kg (228 lb)  104.6 kg (230 lb 9.6 oz)   Height:        Intake/Output Summary (Last 24 hours) at 12/04/16 1021 Last data filed at 12/04/16 0959  Gross per 24 hour  Intake              240 ml  Output              700 ml  Net             -460 ml     Filed Weights   12/03/16 1725 12/03/16 2230 12/04/16 0645  Weight: 104.3 kg (230 lb) 103.4 kg (228 lb) 104.6 kg (230 lb 9.6 oz)    Exam:    Constitutional. Appears calm, comfortable.  Respiratory. Clear to auscultation bilaterally. No wheezes, rales or rhonchi. Normal respiratory effort.  Cardiovascular. Regular rate and rhythm. No murmur, rub or gallop. Telemetry sinus rhythm.  Psychiatric. Grossly normal mood and affect. Speech fluent and appropriate.  I have personally reviewed following labs and imaging studies:  Potassium 2.9.  BUN 18, stable. Creatinine 1.71, no significant change.  Troponins flat 0.17 >> 0.16  Scheduled Meds: . aspirin EC  81 mg Oral Daily  . diltiazem  120 mg Oral Daily  . donepezil  10 mg  Oral QHS  . famotidine  20 mg Oral BID  . furosemide  40 mg Oral BID  . insulin NPH Human  10 Units Subcutaneous BID AC & HS  . liraglutide  1.2 mg Subcutaneous q morning - 10a  . losartan  100 mg Oral Daily  . metoprolol tartrate  25 mg Oral BID   Continuous Infusions:  Principal Problem:   Chest pain Active Problems:   Pulmonary embolism without acute cor pulmonale ([place]) 2016 post op treated for a year   Paroxysmal atrial fibrillation ([place])   COPD GOLD III   AAA (abdominal aortic aneurysm)  ([place]) s/p repair in oct 2017 by dr Scot Dock   Palpitations   Chronic combined systolic and diastolic CHF (congestive heart failure) ([place])   AKI (acute kidney injury) ([place]) vs CKD   LOS: 0 days

## 2016-12-04 NOTE — Progress Notes (Signed)
CRITICAL VALUE ALERT  Critical value received:  Troponin 0.17  Date of notification:  12/03/16  Time of notification:    Critical value read back:Yes.    Nurse who received alert:  Lorriane Shire RN  MD notified (1st page):  Dr. Hilbert Bible  Time of first page:  0005  MD notified (2nd page): Dr. Shanon Brow  Time of second page: 0008  Responding MD:    Time MD responded:

## 2016-12-04 NOTE — Progress Notes (Signed)
Dr. Sarajane Jews paged regarding BP 173/79, HR 75. Patient asymptomatic.

## 2016-12-04 NOTE — Care Management Note (Signed)
Case Management Note  Patient Details  Name: Rodney Orozco MRN: [id number] Date of Birth: 05-17-1941  Subjective/Objective:       Patient adm from home with CP, ind with ADL's at baseline.  Wears HS oxygen at home.  Reports no issues. No HH PTA, [facility] in the past after surgery. Awaiting cardiology consult, may need cath and would transfer to [facility].             Action/Plan: Anticipate DC home with self care, pending cards consult.    Expected Discharge Date:        12/05/2016          Expected Discharge Plan:  Home/Self Care  In-House Referral:  NA  Discharge planning Services  CM Consult  Post Acute Care Choice:  NA Choice offered to:  NA  DME Arranged:    DME Agency:     HH Arranged:    HH Agency:     Status of Service:  Completed, signed off  If discussed at [employer] of Stay Meetings, dates discussed:    Additional Comments:  Kirk Basquez, Chauncey Reading, RN 12/04/2016, 11:06 AM

## 2016-12-05 ENCOUNTER — Encounter (HOSPITAL_COMMUNITY)
Admission: EM | Disposition: A | Discharge status: Home / Self Care | Payer: Self-pay | Source: Home / Self Care | Attending: Family Medicine

## 2016-12-05 ENCOUNTER — Encounter (HOSPITAL_COMMUNITY): Payer: Self-pay | Admitting: Interventional Cardiology

## 2016-12-05 DIAGNOSIS — E559 Vitamin D deficiency, unspecified: Secondary | ICD-10-CM | POA: Diagnosis present

## 2016-12-05 DIAGNOSIS — I10 Essential (primary) hypertension: Secondary | ICD-10-CM | POA: Diagnosis present

## 2016-12-05 DIAGNOSIS — E1122 Type 2 diabetes mellitus with diabetic chronic kidney disease: Secondary | ICD-10-CM | POA: Diagnosis present

## 2016-12-05 DIAGNOSIS — E119 Type 2 diabetes mellitus without complications: Secondary | ICD-10-CM | POA: Diagnosis present

## 2016-12-05 DIAGNOSIS — I2510 Atherosclerotic heart disease of native coronary artery without angina pectoris: Secondary | ICD-10-CM | POA: Diagnosis not present

## 2016-12-05 DIAGNOSIS — Z8673 Personal history of transient ischemic attack (TIA), and cerebral infarction without residual deficits: Secondary | ICD-10-CM | POA: Diagnosis not present

## 2016-12-05 DIAGNOSIS — I255 Ischemic cardiomyopathy: Secondary | ICD-10-CM | POA: Diagnosis present

## 2016-12-05 DIAGNOSIS — R0789 Other chest pain: Secondary | ICD-10-CM | POA: Diagnosis not present

## 2016-12-05 DIAGNOSIS — G4733 Obstructive sleep apnea (adult) (pediatric): Secondary | ICD-10-CM | POA: Diagnosis present

## 2016-12-05 DIAGNOSIS — I429 Cardiomyopathy, unspecified: Secondary | ICD-10-CM | POA: Diagnosis not present

## 2016-12-05 DIAGNOSIS — I249 Acute ischemic heart disease, unspecified: Secondary | ICD-10-CM

## 2016-12-05 DIAGNOSIS — J449 Chronic obstructive pulmonary disease, unspecified: Secondary | ICD-10-CM | POA: Diagnosis present

## 2016-12-05 DIAGNOSIS — M1990 Unspecified osteoarthritis, unspecified site: Secondary | ICD-10-CM | POA: Diagnosis present

## 2016-12-05 DIAGNOSIS — R002 Palpitations: Secondary | ICD-10-CM | POA: Diagnosis present

## 2016-12-05 DIAGNOSIS — I25110 Atherosclerotic heart disease of native coronary artery with unstable angina pectoris: Secondary | ICD-10-CM | POA: Diagnosis present

## 2016-12-05 DIAGNOSIS — I5042 Chronic combined systolic (congestive) and diastolic (congestive) heart failure: Secondary | ICD-10-CM | POA: Diagnosis present

## 2016-12-05 DIAGNOSIS — R748 Abnormal levels of other serum enzymes: Secondary | ICD-10-CM | POA: Diagnosis not present

## 2016-12-05 DIAGNOSIS — I248 Other forms of acute ischemic heart disease: Secondary | ICD-10-CM | POA: Diagnosis not present

## 2016-12-05 DIAGNOSIS — I714 Abdominal aortic aneurysm, without rupture: Secondary | ICD-10-CM | POA: Diagnosis not present

## 2016-12-05 DIAGNOSIS — Z794 Long term (current) use of insulin: Secondary | ICD-10-CM | POA: Diagnosis not present

## 2016-12-05 DIAGNOSIS — N179 Acute kidney failure, unspecified: Secondary | ICD-10-CM | POA: Diagnosis present

## 2016-12-05 DIAGNOSIS — R7989 Other specified abnormal findings of blood chemistry: Secondary | ICD-10-CM

## 2016-12-05 DIAGNOSIS — F0390 Unspecified dementia without behavioral disturbance: Secondary | ICD-10-CM | POA: Diagnosis present

## 2016-12-05 DIAGNOSIS — I130 Hypertensive heart and chronic kidney disease with heart failure and stage 1 through stage 4 chronic kidney disease, or unspecified chronic kidney disease: Secondary | ICD-10-CM | POA: Diagnosis present

## 2016-12-05 DIAGNOSIS — Z9981 Dependence on supplemental oxygen: Secondary | ICD-10-CM | POA: Diagnosis not present

## 2016-12-05 DIAGNOSIS — Z7982 Long term (current) use of aspirin: Secondary | ICD-10-CM | POA: Diagnosis not present

## 2016-12-05 DIAGNOSIS — I480 Paroxysmal atrial fibrillation: Secondary | ICD-10-CM | POA: Diagnosis present

## 2016-12-05 DIAGNOSIS — N189 Chronic kidney disease, unspecified: Secondary | ICD-10-CM | POA: Diagnosis present

## 2016-12-05 DIAGNOSIS — E782 Mixed hyperlipidemia: Secondary | ICD-10-CM | POA: Diagnosis present

## 2016-12-05 DIAGNOSIS — Z87891 Personal history of nicotine dependence: Secondary | ICD-10-CM | POA: Diagnosis not present

## 2016-12-05 DIAGNOSIS — I214 Non-ST elevation (NSTEMI) myocardial infarction: Secondary | ICD-10-CM | POA: Diagnosis not present

## 2016-12-05 DIAGNOSIS — R778 Other specified abnormalities of plasma proteins: Secondary | ICD-10-CM

## 2016-12-05 DIAGNOSIS — Z96653 Presence of artificial knee joint, bilateral: Secondary | ICD-10-CM | POA: Diagnosis present

## 2016-12-05 DIAGNOSIS — Z86711 Personal history of pulmonary embolism: Secondary | ICD-10-CM | POA: Diagnosis not present

## 2016-12-05 HISTORY — PX: CARDIAC CATHETERIZATION: SHX172

## 2016-12-05 LAB — POCT I-STAT 3, VENOUS BLOOD GAS (G3P V)
Acid-Base Excess: 1 mmol/L (ref 0.0–2.0)
Bicarbonate: 26.2 mmol/L (ref 20.0–28.0)
O2 SAT: 68 %
TCO2: 28 mmol/L (ref 0–100)
pCO2, Ven: 42.5 mmHg — ABNORMAL LOW (ref 44.0–60.0)
pH, Ven: 7.399 (ref 7.250–7.430)
pO2, Ven: 35 mmHg (ref 32.0–45.0)

## 2016-12-05 LAB — BASIC METABOLIC PANEL
Anion gap: 9 (ref 5–15)
BUN: 16 mg/dL (ref 6–20)
CHLORIDE: 104 mmol/L (ref 101–111)
CO2: 26 mmol/L (ref 22–32)
CREATININE: 1.5 mg/dL — AB (ref 0.61–1.24)
Calcium: 9.1 mg/dL (ref 8.9–10.3)
GFR calc Af Amer: 52 mL/min — ABNORMAL LOW (ref >60)
GFR calc non Af Amer: 44 mL/min — ABNORMAL LOW (ref >60)
Glucose, Bld: 155 mg/dL — ABNORMAL HIGH (ref 65–99)
Potassium: 3.6 mmol/L (ref 3.5–5.1)
SODIUM: 139 mmol/L (ref 135–145)

## 2016-12-05 LAB — POCT I-STAT 3, ART BLOOD GAS (G3+)
ACID-BASE DEFICIT: 1 mmol/L (ref 0.0–2.0)
BICARBONATE: 23.1 mmol/L (ref 20.0–28.0)
O2 Saturation: 94 %
TCO2: 24 mmol/L (ref 0–100)
pCO2 arterial: 36.9 mmHg (ref 32.0–48.0)
pH, Arterial: 7.405 (ref 7.350–7.450)
pO2, Arterial: 69 mmHg — ABNORMAL LOW (ref 83.0–108.0)

## 2016-12-05 LAB — GLUCOSE, CAPILLARY
GLUCOSE-CAPILLARY: 177 mg/dL — AB (ref 65–99)
GLUCOSE-CAPILLARY: 149 mg/dL — AB (ref 65–99)
Glucose-Capillary: 124 mg/dL — ABNORMAL HIGH (ref 65–99)
Glucose-Capillary: 207 mg/dL — ABNORMAL HIGH (ref 65–99)

## 2016-12-05 LAB — PROTIME-INR
INR: 0.92
Prothrombin Time: 12.4 seconds (ref 11.4–15.2)

## 2016-12-05 LAB — POCT ACTIVATED CLOTTING TIME: Activated Clotting Time: 450 seconds

## 2016-12-05 SURGERY — RIGHT/LEFT HEART CATH AND CORONARY ANGIOGRAPHY

## 2016-12-05 MED ORDER — LABETALOL HCL 5 MG/ML IV SOLN
INTRAVENOUS | Status: AC
  Filled 2016-12-05: qty 4

## 2016-12-05 MED ORDER — SODIUM CHLORIDE 0.9 % IV SOLN: 250.0000 mL | INTRAVENOUS | Status: DC | PRN

## 2016-12-05 MED ORDER — HEPARIN (PORCINE) IN NACL 2-0.9 UNIT/ML-% IJ SOLN
INTRAMUSCULAR | Status: AC
  Filled 2016-12-05: qty 1000

## 2016-12-05 MED ORDER — BIVALIRUDIN 250 MG IV SOLR
INTRAVENOUS | Status: AC
  Filled 2016-12-05: qty 250

## 2016-12-05 MED ORDER — SODIUM CHLORIDE 0.9 % IV SOLN: 1.7500 mg/kg/h | INTRAVENOUS | Status: DC

## 2016-12-05 MED ORDER — MIDAZOLAM HCL 2 MG/2ML IJ SOLN
INTRAMUSCULAR | Status: DC | PRN
  Administered 2016-12-05 (×2): 1 mg via INTRAVENOUS

## 2016-12-05 MED ORDER — SODIUM CHLORIDE 0.9 % IV SOLN
INTRAVENOUS | Status: DC | PRN
  Administered 2016-12-05: 10 mL/h via INTRAVENOUS

## 2016-12-05 MED ORDER — IOPAMIDOL (ISOVUE-370) INJECTION 76%
INTRAVENOUS | Status: AC
  Filled 2016-12-05: qty 125

## 2016-12-05 MED ORDER — SODIUM CHLORIDE 0.9 % IV SOLN: INTRAVENOUS | Status: DC

## 2016-12-05 MED ORDER — TICAGRELOR 90 MG PO TABS
ORAL_TABLET | ORAL | Status: AC
  Filled 2016-12-05: qty 2

## 2016-12-05 MED ORDER — SODIUM CHLORIDE 0.9 % IV SOLN: INTRAVENOUS | Status: AC

## 2016-12-05 MED ORDER — LABETALOL HCL 5 MG/ML IV SOLN
10.0000 mg | INTRAVENOUS | Status: DC | PRN
  Administered 2016-12-05 (×3): 10 mg via INTRAVENOUS
  Filled 2016-12-05 (×2): qty 4

## 2016-12-05 MED ORDER — ASPIRIN 81 MG PO CHEW
81.0000 mg | CHEWABLE_TABLET | ORAL | Status: AC
  Administered 2016-12-05: 81 mg via ORAL
  Filled 2016-12-05: qty 1

## 2016-12-05 MED ORDER — SODIUM CHLORIDE 0.9% FLUSH: 3.0000 mL | INTRAVENOUS | Status: DC | PRN

## 2016-12-05 MED ORDER — ACETAMINOPHEN 325 MG PO TABS: 650.0000 mg | ORAL_TABLET | ORAL | Status: DC | PRN

## 2016-12-05 MED ORDER — ONDANSETRON HCL 4 MG/2ML IJ SOLN: 4.0000 mg | Freq: Four times a day (QID) | INTRAMUSCULAR | Status: DC | PRN

## 2016-12-05 MED ORDER — LOSARTAN POTASSIUM 50 MG PO TABS
100.0000 mg | ORAL_TABLET | Freq: Every day | ORAL | Status: DC
  Administered 2016-12-05 – 2016-12-06 (×2): 100 mg via ORAL
  Filled 2016-12-05 (×2): qty 2

## 2016-12-05 MED ORDER — IOPAMIDOL (ISOVUE-370) INJECTION 76%
INTRAVENOUS | Status: DC | PRN
  Administered 2016-12-05: 155 mL via INTRA_ARTERIAL

## 2016-12-05 MED ORDER — ANGIOPLASTY BOOK
Freq: Once | Status: AC
  Administered 2016-12-05: 22:00:00
  Filled 2016-12-05: qty 1

## 2016-12-05 MED ORDER — IOPAMIDOL (ISOVUE-370) INJECTION 76%
INTRAVENOUS | Status: AC
  Filled 2016-12-05: qty 100

## 2016-12-05 MED ORDER — SODIUM CHLORIDE 0.9% FLUSH: 3.0000 mL | Freq: Two times a day (BID) | INTRAVENOUS | Status: DC

## 2016-12-05 MED ORDER — FENTANYL CITRATE (PF) 100 MCG/2ML IJ SOLN
INTRAMUSCULAR | Status: DC | PRN
  Administered 2016-12-05 (×2): 25 ug via INTRAVENOUS

## 2016-12-05 MED ORDER — AMLODIPINE BESYLATE 5 MG PO TABS
5.0000 mg | ORAL_TABLET | Freq: Every day | ORAL | Status: DC
  Administered 2016-12-05 – 2016-12-06 (×2): 5 mg via ORAL
  Filled 2016-12-05 (×2): qty 1

## 2016-12-05 MED ORDER — TICAGRELOR 90 MG PO TABS
90.0000 mg | ORAL_TABLET | Freq: Two times a day (BID) | ORAL | Status: DC
  Administered 2016-12-06 (×2): 90 mg via ORAL
  Filled 2016-12-05 (×2): qty 1

## 2016-12-05 MED ORDER — NITROGLYCERIN 1 MG/10 ML FOR IR/CATH LAB
INTRA_ARTERIAL | Status: DC | PRN
  Administered 2016-12-05: 200 ug via INTRACORONARY

## 2016-12-05 MED ORDER — SODIUM CHLORIDE 0.9% FLUSH
3.0000 mL | Freq: Two times a day (BID) | INTRAVENOUS | Status: DC
  Administered 2016-12-06: 3 mL via INTRAVENOUS

## 2016-12-05 MED ORDER — LIDOCAINE HCL (PF) 1 % IJ SOLN
INTRAMUSCULAR | Status: DC | PRN
  Administered 2016-12-05: 20 mL
  Administered 2016-12-05: 2 mL

## 2016-12-05 MED ORDER — TICAGRELOR 90 MG PO TABS
ORAL_TABLET | ORAL | Status: DC | PRN
  Administered 2016-12-05: 180 mg via ORAL

## 2016-12-05 MED ORDER — NITROGLYCERIN 1 MG/10 ML FOR IR/CATH LAB
INTRA_ARTERIAL | Status: AC
  Filled 2016-12-05: qty 10

## 2016-12-05 MED ORDER — FENTANYL CITRATE (PF) 100 MCG/2ML IJ SOLN
INTRAMUSCULAR | Status: AC
  Filled 2016-12-05: qty 2

## 2016-12-05 MED ORDER — ASPIRIN 81 MG PO CHEW
81.0000 mg | CHEWABLE_TABLET | Freq: Every day | ORAL | Status: DC
  Administered 2016-12-06: 10:00:00 81 mg via ORAL
  Filled 2016-12-05: qty 1

## 2016-12-05 MED ORDER — HYDRALAZINE HCL 20 MG/ML IJ SOLN
5.0000 mg | INTRAMUSCULAR | Status: DC | PRN
  Administered 2016-12-05: 5 mg via INTRAVENOUS

## 2016-12-05 MED ORDER — VERAPAMIL HCL 2.5 MG/ML IV SOLN
INTRAVENOUS | Status: DC | PRN
  Administered 2016-12-05: 10 mL via INTRA_ARTERIAL

## 2016-12-05 MED ORDER — HEPARIN SODIUM (PORCINE) 1000 UNIT/ML IJ SOLN
INTRAMUSCULAR | Status: DC | PRN
  Administered 2016-12-05: 5000 [IU] via INTRAVENOUS

## 2016-12-05 MED ORDER — ASPIRIN 81 MG PO CHEW: 81.0000 mg | CHEWABLE_TABLET | ORAL | Status: DC

## 2016-12-05 MED ORDER — MIDAZOLAM HCL 2 MG/2ML IJ SOLN
INTRAMUSCULAR | Status: AC
  Filled 2016-12-05: qty 2

## 2016-12-05 MED ORDER — HEPARIN SODIUM (PORCINE) 1000 UNIT/ML IJ SOLN
INTRAMUSCULAR | Status: AC
  Filled 2016-12-05: qty 1

## 2016-12-05 MED ORDER — HEPARIN (PORCINE) IN NACL 2-0.9 UNIT/ML-% IJ SOLN
INTRAMUSCULAR | Status: DC | PRN
  Administered 2016-12-05: 1000 mL

## 2016-12-05 MED ORDER — BIVALIRUDIN BOLUS VIA INFUSION - CUPID
INTRAVENOUS | Status: DC | PRN
  Administered 2016-12-05: 78.45 mg via INTRAVENOUS

## 2016-12-05 MED ORDER — SODIUM CHLORIDE 0.9 % IV SOLN
INTRAVENOUS | Status: DC | PRN
  Administered 2016-12-05 (×2): 1.75 mg/kg/h via INTRAVENOUS

## 2016-12-05 MED ORDER — HYDRALAZINE HCL 20 MG/ML IJ SOLN
INTRAMUSCULAR | Status: AC
  Filled 2016-12-05: qty 1

## 2016-12-05 MED ORDER — LIDOCAINE HCL (PF) 1 % IJ SOLN
INTRAMUSCULAR | Status: AC
  Filled 2016-12-05: qty 30

## 2016-12-05 MED ORDER — VERAPAMIL HCL 2.5 MG/ML IV SOLN
INTRAVENOUS | Status: AC
  Filled 2016-12-05: qty 2

## 2016-12-05 MED ORDER — LABETALOL HCL 5 MG/ML IV SOLN
20.0000 mg | INTRAVENOUS | Status: DC | PRN
  Administered 2016-12-06 (×2): 20 mg via INTRAVENOUS
  Filled 2016-12-05 (×2): qty 4

## 2016-12-05 SURGICAL SUPPLY — 22 items
BALLN EMERGE MR 3.0X12 (BALLOONS) ×3
BALLN ~~LOC~~ EUPHORA RX 3.25X8 (BALLOONS) ×3
BALLOON EMERGE MR 3.0X12 (BALLOONS) ×1 IMPLANT
BALLOON ~~LOC~~ EUPHORA RX 3.25X8 (BALLOONS) ×1 IMPLANT
CATH 5FR JL3.5 JR4 ANG PIG MP (CATHETERS) ×2 IMPLANT
CATH INFINITI 5FR JL4 (CATHETERS) ×3 IMPLANT
CATH SWAN GANZ 7F STRAIGHT (CATHETERS) ×3 IMPLANT
DEVICE RAD COMP TR BAND LRG (VASCULAR PRODUCTS) ×3 IMPLANT
GLIDESHEATH SLEND SS 6F .021 (SHEATH) ×3 IMPLANT
GUIDE CATH RUNWAY 6FR AL2 (CATHETERS) ×3 IMPLANT
GUIDEWIRE INQWIRE 1.5J.035X260 (WIRE) ×1 IMPLANT
INQWIRE 1.5J .035X260CM (WIRE) ×3
KIT ENCORE 26 ADVANTAGE (KITS) ×3 IMPLANT
KIT HEART LEFT (KITS) ×3 IMPLANT
PACK CARDIAC CATHETERIZATION (CUSTOM PROCEDURE TRAY) ×3 IMPLANT
SHEATH PINNACLE 7F 10CM (SHEATH) ×3 IMPLANT
STENT RESOLUTE ONYX 3.0X12 (Permanent Stent) ×2 IMPLANT
STENT RESOLUTE ONYX 5.0X12 (Permanent Stent) ×3 IMPLANT
TRANSDUCER W/STOPCOCK (MISCELLANEOUS) ×3 IMPLANT
TUBING CIL FLEX 10 FLL-RA (TUBING) ×3 IMPLANT
VALVE GUARDIAN II ~~LOC~~ HEMO (MISCELLANEOUS) ×3 IMPLANT
WIRE ASAHI PROWATER 180CM (WIRE) ×3 IMPLANT

## 2016-12-05 NOTE — Progress Notes (Signed)
Progress Note  Patient Name: Rodney Orozco Date of Encounter: 12/05/2016  Primary Cardiologist: Dr. Quay Burow   Subjective   Some complaints of chest discomfort, "not bad." Has felt heart racing " a little." Aware of pending heart catherization this am. No further questions.  Inpatient Medications    Scheduled Meds: . aspirin  81 mg Oral Pre-Cath  . aspirin EC  81 mg Oral Daily  . diltiazem  120 mg Oral Daily  . donepezil  10 mg Oral QHS  . famotidine  20 mg Oral BID  . insulin aspart  0-9 Units Subcutaneous TID WC  . metoprolol tartrate  25 mg Oral BID   Continuous Infusions: . sodium chloride 100 mL/hr at 12/05/16 0610   PRN Meds: acetaminophen, ondansetron (ZOFRAN) IV   Vital Signs    Vitals:   12/04/16 1340 12/04/16 1740 12/04/16 2140 12/05/16 0430  BP: (!) 172/90 (!) 175/87 (!) 175/78 (!) 168/87  Pulse: 79 78 86 85  Resp: '18 20 18 18  '$ Temp: 98.5 F (36.9 C) 98.3 F (36.8 C) 98.6 F (37 C) 98.1 F (36.7 C)  TempSrc: Oral Oral Oral Oral  SpO2: 94% 92% 93% 94%  Weight:      Height:        Intake/Output Summary (Last 24 hours) at 12/05/16 0756 Last data filed at 12/05/16 0610  Gross per 24 hour  Intake              485 ml  Output             2000 ml  Net            -1515 ml   Filed Weights   12/03/16 1725 12/03/16 2230 12/04/16 0645  Weight: 230 lb (104.3 kg) 228 lb (103.4 kg) 230 lb 9.6 oz (104.6 kg)    Telemetry    I personally reviewed telemetry shows sinus rhythm with occasional PVCs. No atrial fibrillation.  ECG    I personally reviewed the tracing from 12/03/2016 which sinus rhythm with leftward axis and IVCD.  Physical Exam   GEN: No acute distress.  Neck: No JVD Cardiac: RRR, no murmurs, rubs, or gallops.  Respiratory: Diminished in the right base. Mild inspiratory crackles.  MS: No edema; No deformity.  Labs    Chemistry Recent Labs Lab 12/03/16 1738 12/04/16 0454 12/05/16 0502  NA 140 140 139  K 3.5 2.9* 3.6  CL  102 101 104  CO2 '26 28 26  '$ GLUCOSE 153* 105* 155*  BUN '18 18 16  '$ CREATININE 1.74* 1.71* 1.50*  CALCIUM 9.3 8.9 9.1  GFRNONAA 37* 38* 44*  GFRAA 43* 44* 52*  ANIONGAP '12 11 9     '$ Hematology Recent Labs Lab 12/03/16 1738  WBC 14.6*  RBC 4.76  HGB 14.1  HCT 42.7  MCV 89.7  MCH 29.6  MCHC 33.0  RDW 13.6  PLT 325    Cardiac Enzymes Recent Labs Lab 12/03/16 2226 12/04/16 0140 12/04/16 0454  TROPONINI 0.17* 0.17* 0.16*    Recent Labs Lab 12/03/16 1813 12/03/16 2032  TROPIPOC 0.12* 0.09*      Radiology    Dg Chest 2 View  Result Date: 12/03/2016 CLINICAL DATA:  Shortness of breath, arrhythmia EXAM: CHEST  2 VIEW COMPARISON:  08/31/2016 FINDINGS: Borderline cardiomegaly. Central mild vascular congestion without pulmonary edema. Calcified pleural and diaphragmatic plaques again noted. No segmental infiltrate. Elevation of the right hemidiaphragm again noted. Osteopenia and mild degenerative changes thoracic spine. IMPRESSION: Central  mild vascular congestion without pulmonary edema. Calcified pleural and diaphragmatic plaques again noted. No segmental infiltrate. Elevation of the right hemidiaphragm again noted. Osteopenia and mild degenerative changes thoracic spine. Electronically Signed   By: Lahoma Crocker M.D.   On: 12/03/2016 19:00    Cardiac Studies   Echocardiogram 12/04/2016 Left ventricle: The cavity size was normal. Wall thickness was   increased in a pattern of moderate LVH. Systolic function was   mildly to moderately reduced. The estimated ejection fraction was   45%. Diffuse hypokinesis. Doppler parameters are consistent with   abnormal left ventricular relaxation (grade 1 diastolic   dysfunction). - Aortic valve: Mildly calcified annulus. Trileaflet; mildly   calcified leaflets. There was trivial regurgitation. - Mitral valve: Calcified annulus. There was trivial regurgitation. - Left atrium: The atrium was mildly dilated. - Right atrium: Central venous  pressure (est): 3 mm Hg. - Atrial septum: No defect or patent foramen ovale was identified. - Tricuspid valve: There was trivial regurgitation. - Pulmonary arteries: PA peak pressure: 31 mm Hg (S). - Pericardium, extracardiac: There was no pericardial effusion.  Impressions:  - Moderate LVH with LVEF approximately 45%. Overall diffuse   hypokinesis. Grade 1 diastolic dysfunction. Mild left atrial   enlargement. Mildly calcified mitral annulus with trivial mitral   regurgitation. Sclerotic aortic valve with trivial aortic   regurgitation. Trivial tricuspid regurgitation with PASP   estimated 31 mmHg.  Patient Profile     76 y.o. male with a history of Endovascular repair of abdominal aortic aneurysm 08/2016. Had PAF during hospitalization. Xarelto stopped after AAA repair at [facility] during COPD exacerbation. 2Decho LVEF 40-45% with mild to mod RVD. Pre-op nuclear stress 08/2016 LVEF 41% no ischemia, intermediate risk. Has 100 pk years smoking quit 5 yrs ago, HTN, HLD intolerant to statins and OSA on bipap. Admitted with chest pain and heart racing. PAF suspected. Troponin I elevated but equivocal for ACS. Plan transfer to [facility] for  Cardiac catherization today.   Assessment & Plan    1. Anginal chest pain:  Mildly elevated troponin which are flat and equivocal. Patient has significant cardiac risk factors and documented vascular disease although no definite ischemia by Myoview this past October 2017. At this point plan is for right and left heart catheterization today. Has been NPO. Will be transferred in time for cardiac cath at 10:30 am. No beds available for transfer yesterday. Patient had no further questions about the procedure today.  2. PAF: Rapid HR and palpitations also contributing to current admission, although no recurrent atrial fibrillation documented as yet. With known history of PAF and elevated thromboembolic risk score, anticoagulation will need to  be resumed ultimately after cardiac catheterization. CHADSVASC score of 7.  3. Cardiomyopathy: Presumed nonischemic to this point based on Myoview from October 2017 but will be further evaluated by cardiac catheterization as outlined above. Medical therapy can be adjusted thereafter depending on findings. Currently not on ACE inhibitor or ARB with recent renal insufficiency. LVEF of 40-45%.  4. COPD- GOLD III: Continue current management. Right heart cath is to be done today as well.   5. Renal insufficiency:  Creatinine up to 1.7. Improved with hydration down to 1.5.  Signed, Jory Sims, NP  12/05/2016, 7:56 AM     Attending note:  Patient seen and examined. Modified above note by Ms. Lawrence NP including the assessment and plan section which reflects my recommendations. Patient was not transferred due to bed status at [facility], but  should be going by [facility] this morning to the cardiac catheterization lab for his procedure and then admission thereafter.  Satira Sark, M.D., F.A.C.C.

## 2016-12-05 NOTE — Progress Notes (Signed)
/  W SECRET @ [employer] # [phone]   BRILINTA 90 MG BID   COVER- YES  CO-PAY- $ 47.00  TIER- 3 DRUG  PRIOR APPROVAL- NO  PHARMACY : [facility]

## 2016-12-05 NOTE — Interval H&P Note (Signed)
Cath Lab Visit (complete for each Cath Lab visit)  Clinical Evaluation Leading to the Procedure:   ACS: Yes.    Non-ACS:    Anginal Classification: CCS IV  Anti-ischemic medical therapy: Minimal Therapy (1 class of medications)  Non-Invasive Test Results: No non-invasive testing performed  Prior CABG: No previous CABG  EF 45%    History and Physical Interval Note:  12/05/2016 10:35 AM  Rodney Orozco  has presented today for surgery, with the diagnosis of cp  The various methods of treatment have been discussed with the patient and family. After consideration of risks, benefits and other options for treatment, the patient has consented to  Procedure(s): Right/Left Heart Cath and Coronary Angiography (N/A) as a surgical intervention .  The patient's history has been reviewed, patient examined, no change in status, stable for surgery.  I have reviewed the patient's chart and labs.  Questions were answered to the patient's satisfaction.     Rodney Orozco

## 2016-12-05 NOTE — Progress Notes (Addendum)
PROGRESS NOTE  DEVIN GANAWAY TIR:[id number] DOB: Apr 08, 1941 DOA: 12/03/2016 PCP: [name], DO  Brief Narrative: 76 year old man PMH paroxysmal atrial fibrillation off anticoagulation status post AAA repair October 2017, history PE 2016 provoked by any surgery, presented with palpitations, shortness of breath, chest pain. Admitted for chest pain, elevated troponin, suspect atrial fibrillation with rapid ventricular response, cardiology consultation and echocardiogram.  Assessment/Plan 1. Chest chest pain with elevated troponin, concerning for ischemia. Troponins flat. EKG nonacute. Demand ischemia secondary to tachyarrhythmia suspected.  Continue current management. Appears stable for transfer for further evaluation. 2. PMH PAF with CHA2DS2-VASc 7.   Defer anticoagulation and management to cardiology.  3. Acute kidney improved today.  Continue to hold losartan and Lasix. No evidence of volume overload.  4. S/p AAA repair  5. DM type 2, metformin on hold, hold Victoza until after LHC. Hold long-acting insulin until after catheterization. Blood sugars are stable. 6. PMH PE, dementia, nocturnal oxygen, COPD, OSA on CPAP, chronic systolic/diastolic CHF   Appears stable for transfer for right and left heart catheterization. Defer management to cardiology.  Recommend resuming long-acting insulin post catheterization , repeat BMP in a.m. recommended.   DVT prophylaxis: SCDs Code Status: full code Family Communication: wife at bedside 1/23 Disposition Plan: to [facility] today  Murray Hodgkins, MD  [facility] Direct contact: [phone] --Via amion app OR  --[email or web address]; password [email or web address]  7PM-7AM contact night coverage as above 12/05/2016, 9:35 AM  LOS: 0 days   Consultants:  Cardiology   Procedures:    Antimicrobials:    Interval history/Subjective: No bed was available at [facility] yesterday.  No issues overnight. Breathing okay. No chest  pain.  Objective: Vitals:   12/04/16 1340 12/04/16 1740 12/04/16 2140 12/05/16 0430  BP: (!) 172/90 (!) 175/87 (!) 175/78 (!) 168/87  Pulse: 79 78 86 85  Resp: '18 20 18 18  '$ Temp: 98.5 F (36.9 C) 98.3 F (36.8 C) 98.6 F (37 C) 98.1 F (36.7 C)  TempSrc: Oral Oral Oral Oral  SpO2: 94% 92% 93% 94%  Weight:      Height:        Intake/Output Summary (Last 24 hours) at 12/05/16 0935 Last data filed at 12/05/16 0700  Gross per 24 hour  Intake              485 ml  Output             2000 ml  Net            -1515 ml     Filed Weights   12/03/16 1725 12/03/16 2230 12/04/16 0645  Weight: 104.3 kg (230 lb) 103.4 kg (228 lb) 104.6 kg (230 lb 9.6 oz)    Exam:    Constitutional. Appears comfortable, calm lying in bed.  Respiratory. Clear to auscultation bilaterally. No wheezes, rales, rhonchi. Normal respiratory effort.  Cardiovascular. Regular rate and rhythm. No murmur, rub, gallop. Telemetry sinus rhythm.  Psychiatric. Grossly normal mood and affect. Speech fluent and appropriate.  I have personally reviewed following labs and imaging studies:  Potassium is normalized. Creatinine improved, 1.5.   INR 0.92.  Scheduled Meds: . aspirin EC  81 mg Oral Daily  . donepezil  10 mg Oral QHS  . famotidine  20 mg Oral BID  . insulin aspart  0-9 Units Subcutaneous TID WC  . metoprolol tartrate  25 mg Oral BID   Continuous Infusions: . sodium chloride 100 mL/hr at 12/05/16 6712    Principal Problem:  Chest pain Active Problems:   Pulmonary embolism without acute cor pulmonale ([place]) 2016 post op treated for a year   Paroxysmal atrial fibrillation ([place])   COPD GOLD III   AAA (abdominal aortic aneurysm) ([place]) s/p repair in oct 2017 by dr Scot Dock   Palpitations   Chronic combined systolic and diastolic CHF (congestive heart failure) ([place])   AKI (acute kidney injury) ([place]) vs CKD   LOS: 0 days

## 2016-12-05 NOTE — Progress Notes (Signed)
Pt has been restless since arrival, frequently bending legs despite venous sheath in and frequent reminders from staff and pt's wife to not bend rt leg or rt wrist.  Sheath removed by Jackelyn Poling RN.  Pedal pulse palpable but weak, PT BP down to 175/95. Pleasantly confused. Pt pulled tele monitor off chest and then said "I didn't do that.  I was trying to fix it."  Monitor back on. Side rails up, call light in reach which pt pushes button frequently but says he doesn't need anything.  Showed him how to use it again.

## 2016-12-05 NOTE — Progress Notes (Signed)
Received pt from [facility] for cath procedure.  Pt alert and oriented X4, and denies any discomfort at this time. Dr Lendell Caprice in to talk to pt about procedure. Consent signed.

## 2016-12-05 NOTE — Progress Notes (Signed)
Report given to Ramsey from [place]. Pt to transfer to [facility] for procedure.   0930 Pt left via stretcher accompanied by [employer].

## 2016-12-05 NOTE — H&P (View-Only) (Signed)
Progress Note  Patient Name: Rodney Orozco Date of Encounter: 12/05/2016  Primary Cardiologist: Dr. Quay Burow   Subjective   Some complaints of chest discomfort, "not bad." Has felt heart racing " a little." Aware of pending heart catherization this am. No further questions.  Inpatient Medications    Scheduled Meds: . aspirin  81 mg Oral Pre-Cath  . aspirin EC  81 mg Oral Daily  . diltiazem  120 mg Oral Daily  . donepezil  10 mg Oral QHS  . famotidine  20 mg Oral BID  . insulin aspart  0-9 Units Subcutaneous TID WC  . metoprolol tartrate  25 mg Oral BID   Continuous Infusions: . sodium chloride 100 mL/hr at 12/05/16 0610   PRN Meds: acetaminophen, ondansetron (ZOFRAN) IV   Vital Signs    Vitals:   12/04/16 1340 12/04/16 1740 12/04/16 2140 12/05/16 0430  BP: (!) 172/90 (!) 175/87 (!) 175/78 (!) 168/87  Pulse: 79 78 86 85  Resp: '18 20 18 18  '$ Temp: 98.5 F (36.9 C) 98.3 F (36.8 C) 98.6 F (37 C) 98.1 F (36.7 C)  TempSrc: Oral Oral Oral Oral  SpO2: 94% 92% 93% 94%  Weight:      Height:        Intake/Output Summary (Last 24 hours) at 12/05/16 0756 Last data filed at 12/05/16 0610  Gross per 24 hour  Intake              485 ml  Output             2000 ml  Net            -1515 ml   Filed Weights   12/03/16 1725 12/03/16 2230 12/04/16 0645  Weight: 230 lb (104.3 kg) 228 lb (103.4 kg) 230 lb 9.6 oz (104.6 kg)    Telemetry    I personally reviewed telemetry shows sinus rhythm with occasional PVCs. No atrial fibrillation.  ECG    I personally reviewed the tracing from 12/03/2016 which sinus rhythm with leftward axis and IVCD.  Physical Exam   GEN: No acute distress.  Neck: No JVD Cardiac: RRR, no murmurs, rubs, or gallops.  Respiratory: Diminished in the right base. Mild inspiratory crackles.  MS: No edema; No deformity.  Labs    Chemistry Recent Labs Lab 12/03/16 1738 12/04/16 0454 12/05/16 0502  NA 140 140 139  K 3.5 2.9* 3.6  CL  102 101 104  CO2 '26 28 26  '$ GLUCOSE 153* 105* 155*  BUN '18 18 16  '$ CREATININE 1.74* 1.71* 1.50*  CALCIUM 9.3 8.9 9.1  GFRNONAA 37* 38* 44*  GFRAA 43* 44* 52*  ANIONGAP '12 11 9     '$ Hematology Recent Labs Lab 12/03/16 1738  WBC 14.6*  RBC 4.76  HGB 14.1  HCT 42.7  MCV 89.7  MCH 29.6  MCHC 33.0  RDW 13.6  PLT 325    Cardiac Enzymes Recent Labs Lab 12/03/16 2226 12/04/16 0140 12/04/16 0454  TROPONINI 0.17* 0.17* 0.16*    Recent Labs Lab 12/03/16 1813 12/03/16 2032  TROPIPOC 0.12* 0.09*      Radiology    Dg Chest 2 View  Result Date: 12/03/2016 CLINICAL DATA:  Shortness of breath, arrhythmia EXAM: CHEST  2 VIEW COMPARISON:  08/31/2016 FINDINGS: Borderline cardiomegaly. Central mild vascular congestion without pulmonary edema. Calcified pleural and diaphragmatic plaques again noted. No segmental infiltrate. Elevation of the right hemidiaphragm again noted. Osteopenia and mild degenerative changes thoracic spine. IMPRESSION: Central  mild vascular congestion without pulmonary edema. Calcified pleural and diaphragmatic plaques again noted. No segmental infiltrate. Elevation of the right hemidiaphragm again noted. Osteopenia and mild degenerative changes thoracic spine. Electronically Signed   By: Lahoma Crocker M.D.   On: 12/03/2016 19:00    Cardiac Studies   Echocardiogram 12/04/2016 Left ventricle: The cavity size was normal. Wall thickness was   increased in a pattern of moderate LVH. Systolic function was   mildly to moderately reduced. The estimated ejection fraction was   45%. Diffuse hypokinesis. Doppler parameters are consistent with   abnormal left ventricular relaxation (grade 1 diastolic   dysfunction). - Aortic valve: Mildly calcified annulus. Trileaflet; mildly   calcified leaflets. There was trivial regurgitation. - Mitral valve: Calcified annulus. There was trivial regurgitation. - Left atrium: The atrium was mildly dilated. - Right atrium: Central venous  pressure (est): 3 mm Hg. - Atrial septum: No defect or patent foramen ovale was identified. - Tricuspid valve: There was trivial regurgitation. - Pulmonary arteries: PA peak pressure: 31 mm Hg (S). - Pericardium, extracardiac: There was no pericardial effusion.  Impressions:  - Moderate LVH with LVEF approximately 45%. Overall diffuse   hypokinesis. Grade 1 diastolic dysfunction. Mild left atrial   enlargement. Mildly calcified mitral annulus with trivial mitral   regurgitation. Sclerotic aortic valve with trivial aortic   regurgitation. Trivial tricuspid regurgitation with PASP   estimated 31 mmHg.  Patient Profile     76 y.o. male with a history of Endovascular repair of abdominal aortic aneurysm 08/2016. Had PAF during hospitalization. Xarelto stopped after AAA repair at [facility] during COPD exacerbation. 2Decho LVEF 40-45% with mild to mod RVD. Pre-op nuclear stress 08/2016 LVEF 41% no ischemia, intermediate risk. Has 100 pk years smoking quit 5 yrs ago, HTN, HLD intolerant to statins and OSA on bipap. Admitted with chest pain and heart racing. PAF suspected. Troponin I elevated but equivocal for ACS. Plan transfer to [facility] for  Cardiac catherization today.   Assessment & Plan    1. Anginal chest pain:  Mildly elevated troponin which are flat and equivocal. Patient has significant cardiac risk factors and documented vascular disease although no definite ischemia by Myoview this past October 2017. At this point plan is for right and left heart catheterization today. Has been NPO. Will be transferred in time for cardiac cath at 10:30 am. No beds available for transfer yesterday. Patient had no further questions about the procedure today.  2. PAF: Rapid HR and palpitations also contributing to current admission, although no recurrent atrial fibrillation documented as yet. With known history of PAF and elevated thromboembolic risk score, anticoagulation will need to  be resumed ultimately after cardiac catheterization. CHADSVASC score of 7.  3. Cardiomyopathy: Presumed nonischemic to this point based on Myoview from October 2017 but will be further evaluated by cardiac catheterization as outlined above. Medical therapy can be adjusted thereafter depending on findings. Currently not on ACE inhibitor or ARB with recent renal insufficiency. LVEF of 40-45%.  4. COPD- GOLD III: Continue current management. Right heart cath is to be done today as well.   5. Renal insufficiency:  Creatinine up to 1.7. Improved with hydration down to 1.5.  Signed, Jory Sims, NP  12/05/2016, 7:56 AM     Attending note:  Patient seen and examined. Modified above note by Ms. Lawrence NP including the assessment and plan section which reflects my recommendations. Patient was not transferred due to bed status at [facility], but  should be going by [facility] this morning to the cardiac catheterization lab for his procedure and then admission thereafter.  Satira Sark, M.D., F.A.C.C.

## 2016-12-05 NOTE — Progress Notes (Signed)
Site area:  Rt groin Site Prior to Removal:  Level 0 Pressure Applied For:  15 minutes Manual:   yes Patient Status During Pull:  stable Post Pull Site:  Level  0 Post Pull Instructions Given:  yes Post Pull Pulses Present: yes Dressing Applied:  tegaderm Bedrest begins @  4599 Comments:

## 2016-12-05 NOTE — Progress Notes (Signed)
Patient pulled out IV left hand. Angiomax and MIVF resumed to rt fv sheath

## 2016-12-05 NOTE — Progress Notes (Signed)
Pt refusing bed alarm.  Explained to pt that the alarm was for his safety.  Pt said he would leave if I cut the alarm on.  Pt refusing to leave the door open and will get out of bed and close it if I leave it open.

## 2016-12-05 NOTE — Care Management Note (Signed)
Case Management Note  Patient Details  Name: Rodney Orozco MRN: [id number] Date of Birth: [birth year]  Subjective/Objective:  S/p coronary stent intervention, will be on brilinta, may need HH services, NCM will cont to follow for dc needs.                 Action/Plan:   Expected Discharge Date:                  Expected Discharge Plan:  [place]  In-House Referral:  NA  Discharge planning Services  CM Consult  Post Acute Care Choice:  NA Choice offered to:  NA  DME Arranged:    DME Agency:     HH Arranged:    HH Agency:     Status of Service:  In process, will continue to follow  If discussed at Long Length of Stay Meetings, dates discussed:    Additional Comments:  Zenon Mayo, RN 12/05/2016, 7:19 PM

## 2016-12-05 NOTE — Progress Notes (Signed)
Received in report pt has had high BP and is restless and forgetful.   BP 216/107, SR 77.  IV Labetalol given.  Wife at bedside helps with reminding pt not to bend leg and wrist but she will be leaving soon..  Side rails up, bed low and locked.  Bed exit alarm on, reminded pt not to get up without calling for help first and call light explained to him and put near his hand.  TR band begun, waiting for BP to come down some before pulling Rt groin venous sheath.  BP down to 183/94.

## 2016-12-06 DIAGNOSIS — I214 Non-ST elevation (NSTEMI) myocardial infarction: Secondary | ICD-10-CM

## 2016-12-06 DIAGNOSIS — I5042 Chronic combined systolic (congestive) and diastolic (congestive) heart failure: Secondary | ICD-10-CM

## 2016-12-06 LAB — CBC
HEMATOCRIT: 40.2 % (ref 39.0–52.0)
HEMOGLOBIN: 13.5 g/dL (ref 13.0–17.0)
MCH: 29.4 pg (ref 26.0–34.0)
MCHC: 33.6 g/dL (ref 30.0–36.0)
MCV: 87.6 fL (ref 78.0–100.0)
Platelets: 283 10*3/uL (ref 150–400)
RBC: 4.59 MIL/uL (ref 4.22–5.81)
RDW: 13.9 % (ref 11.5–15.5)
WBC: 16 10*3/uL — ABNORMAL HIGH (ref 4.0–10.5)

## 2016-12-06 LAB — BASIC METABOLIC PANEL
Anion gap: 10 (ref 5–15)
BUN: 12 mg/dL (ref 6–20)
CALCIUM: 9.3 mg/dL (ref 8.9–10.3)
CO2: 22 mmol/L (ref 22–32)
Chloride: 105 mmol/L (ref 101–111)
Creatinine, Ser: 1.36 mg/dL — ABNORMAL HIGH (ref 0.61–1.24)
GFR calc non Af Amer: 50 mL/min — ABNORMAL LOW (ref >60)
GFR, EST AFRICAN AMERICAN: 58 mL/min — AB (ref >60)
Glucose, Bld: 197 mg/dL — ABNORMAL HIGH (ref 65–99)
POTASSIUM: 3.3 mmol/L — AB (ref 3.5–5.1)
Sodium: 137 mmol/L (ref 135–145)

## 2016-12-06 LAB — GLUCOSE, CAPILLARY
GLUCOSE-CAPILLARY: 229 mg/dL — AB (ref 65–99)
Glucose-Capillary: 222 mg/dL — ABNORMAL HIGH (ref 65–99)

## 2016-12-06 MED ORDER — WARFARIN SODIUM 5 MG PO TABS: 5.0000 mg | ORAL_TABLET | Freq: Every day | ORAL | 3 refills | Status: DC

## 2016-12-06 MED ORDER — AMLODIPINE BESYLATE 5 MG PO TABS: 5.0000 mg | ORAL_TABLET | Freq: Every day | ORAL | 12 refills | Status: AC

## 2016-12-06 MED ORDER — CLOPIDOGREL BISULFATE 75 MG PO TABS: 75.0000 mg | ORAL_TABLET | Freq: Every day | ORAL | 12 refills | Status: AC

## 2016-12-06 MED ORDER — POTASSIUM CHLORIDE CRYS ER 20 MEQ PO TBCR
40.0000 meq | EXTENDED_RELEASE_TABLET | Freq: Once | ORAL | Status: AC
  Administered 2016-12-06: 40 meq via ORAL
  Filled 2016-12-06: qty 2

## 2016-12-06 MED ORDER — CLOPIDOGREL BISULFATE 75 MG PO TABS
300.0000 mg | ORAL_TABLET | Freq: Once | ORAL | Status: AC
  Administered 2016-12-06: 12:00:00 300 mg via ORAL
  Filled 2016-12-06: qty 4

## 2016-12-06 NOTE — Progress Notes (Signed)
Progress Note  Patient Name: Rodney Orozco Date of Encounter: 12/06/2016  Primary Cardiologist: Dr. Gwenlyn Found   Subjective   Feels well, denies chest pain and SOB.   Inpatient Medications    Scheduled Meds: . amLODipine  5 mg Oral Daily  . aspirin  81 mg Oral Daily  . donepezil  10 mg Oral QHS  . famotidine  20 mg Oral BID  . insulin aspart  0-9 Units Subcutaneous TID WC  . losartan  100 mg Oral Daily  . metoprolol tartrate  25 mg Oral BID  . sodium chloride flush  3 mL Intravenous Q12H  . ticagrelor  90 mg Oral BID   Continuous Infusions:  PRN Meds: sodium chloride, acetaminophen, labetalol, ondansetron (ZOFRAN) IV, sodium chloride flush   Vital Signs    Vitals:   12/06/16 0155 12/06/16 0300 12/06/16 0400 12/06/16 0800  BP: (!) 197/79 (!) 161/97 (!) 176/75 (!) 174/87  Pulse: 82   88  Resp: (!) 21 (!) '21 20 20  '$ Temp: 98 F (42.3 C)   98 F (36.7 C)  TempSrc: Oral   Oral  SpO2: 93%   93%  Weight: 231 lb 14.8 oz (105.2 kg)     Height:        Intake/Output Summary (Last 24 hours) at 12/06/16 1126 Last data filed at 12/06/16 0529  Gross per 24 hour  Intake          1883.34 ml  Output             3400 ml  Net         -1516.66 ml   Filed Weights   12/03/16 2230 12/04/16 0645 12/06/16 0155  Weight: 228 lb (103.4 kg) 230 lb 9.6 oz (104.6 kg) 231 lb 14.8 oz (105.2 kg)    Telemetry    NSR- Personally Reviewed  ECG    NSR, Left axis deviation- Personally Reviewed  Physical Exam   GEN: No acute distress.  Neck: No JVD Cardiac: RRR, no murmurs, rubs, or gallops. Right radial site stable without hematoma.  Respiratory: Clear to auscultation bilaterally. GI: Soft, nontender, non-distended  MS: No edema; No deformity. Neuro:  Alert and oriented x 3.  Psych: Normal affect  Labs    Chemistry Recent Labs Lab 12/04/16 0454 12/05/16 0502 12/06/16 0225  NA 140 139 137  K 2.9* 3.6 3.3*  CL 101 104 105  CO2 '28 26 22  '$ GLUCOSE 105* 155* 197*  BUN '18 16  12  '$ CREATININE 1.71* 1.50* 1.36*  CALCIUM 8.9 9.1 9.3  GFRNONAA 38* 44* 50*  GFRAA 44* 52* 58*  ANIONGAP '11 9 10     '$ Hematology Recent Labs Lab 12/03/16 1738 12/06/16 0225  WBC 14.6* 16.0*  RBC 4.76 4.59  HGB 14.1 13.5  HCT 42.7 40.2  MCV 89.7 87.6  MCH 29.6 29.4  MCHC 33.0 33.6  RDW 13.6 13.9  PLT 325 283    Cardiac Enzymes Recent Labs Lab 12/03/16 2226 12/04/16 0140 12/04/16 0454  TROPONINI 0.17* 0.17* 0.16*    Recent Labs Lab 12/03/16 1813 12/03/16 2032  TROPIPOC 0.12* 0.09*       Radiology    No results found.  Cardiac Studies   Transthoracic Echocardiography 12/04/16 Study Conclusions  - Left ventricle: The cavity size was normal. Wall thickness was   increased in a pattern of moderate LVH. Systolic function was   mildly to moderately reduced. The estimated ejection fraction was   45%. Diffuse hypokinesis. Doppler parameters are  consistent with   abnormal left ventricular relaxation (grade 1 diastolic   dysfunction). - Aortic valve: Mildly calcified annulus. Trileaflet; mildly   calcified leaflets. There was trivial regurgitation. - Mitral valve: Calcified annulus. There was trivial regurgitation. - Left atrium: The atrium was mildly dilated. - Right atrium: Central venous pressure (est): 3 mm Hg. - Atrial septum: No defect or patent foramen ovale was identified. - Tricuspid valve: There was trivial regurgitation. - Pulmonary arteries: PA peak pressure: 31 mm Hg (S). - Pericardium, extracardiac: There was no pericardial effusion.  Impressions:  - Moderate LVH with LVEF approximately 45%. Overall diffuse   hypokinesis. Grade 1 diastolic dysfunction. Mild left atrial   enlargement. Mildly calcified mitral annulus with trivial mitral   regurgitation. Sclerotic aortic valve with trivial aortic   regurgitation. Trivial tricuspid regurgitation with PASP   estimated 31 mmHg.  Coronary Stent Intervention  Right/Left Heart Cath and Coronary  Angiography 12/05/16    Posterolateral artery, 90 %stenosed. A STENT RESOLUTE ONYX 3.0X12 drug eluting stent was successfully placed, post dilated to 3.3 mm.  Post intervention, there is a 0% residual stenosis.  Ectatic RCA. Mid RCA lesion, 95 %stenosed. A STENT RESOLUTE ONYX 5.0X12 drug eluting stent was successfully placed.  Post intervention, there is a 0% residual stenosis.  Mid Cx lesion, 80 %stenosed. This is a relatively small vessel.  Ost Cx lesion, 50 %stenosed.  Ost LAD to Mid LAD lesion, 25 %stenosed.  RPDA lesion, 60-70 %stenosed. Given our efforts to limit contrast dye, we did not further evaluate this lesion.  LV end diastolic pressure is normal.  LV end diastolic pressure is normal.  There is no pulmonic valve stenosis.  Normal right heart pressures.  PA sat 68%. CO 5.9 L/min. CI 2.67.   He will need dual antiplatelet therapy for 12 months.  In speaking to the wife, she mentioned that if anticoagulation was needed, COumadin would be preferred.  If he is started on Coumadin or any other anticoagulation for PAF, would change Brilinta to Plavix.  Would give Plavix 300 mg on the first day followed by 75 mg daily.   Very large right coronary artery.  Hopefully, revascularization will help his overall ejection fraction. There is some significant disease in a relatively small circumflex vessel. I think this would be suitable for medical therapy. Moderate lesion in the mid PDA. This was deferred to avoid giving excessive contrast. This could be considered for intervention in the future if required.   Will hydrate aggressively.  Watch renal function and watch for any signs of heart failure.   Patient Profile     76 y.o. male with a history of Endovascular repair of abdominal aortic aneurysm 08/2016. Had PAF during hospitalization. Xarelto stopped after AAA repair at [facility] during COPD exacerbation. 2Decho LVEF 40-45% with mild to mod RVD. Pre-op  nuclear stress 08/2016 LVEF 41% no ischemia, intermediate risk. Has 100 pk years smoking quit 5 yrs ago, HTN, HLD intolerant to statins and OSA on bipap. Admitted with chest pain and heart racing. PAF suspected. Troponin I elevated but equivocal for ACS. Plan transfer to [facility] for cardiac cath.   Assessment & Plan    1. Unstable angina:  Troponin with flat trend,  atient has significant cardiac risk factors and documented vascular disease although no definite ischemia by Myoview this past October 2017.   Was [facility] for heart cath. Report above. Had a large dominant RCA with 95% stenosis in the  mid RCA. S/p 5.0X12 drug eluting stent was successfully placed.   He will need DAPT for 12 months with ASA and Brilinta.   2. PAF: Rapid HR and palpitations also contributing to current admission, although no recurrent atrial fibrillation documented as yet. With known history of PAF and elevated thromboembolic risk score, anticoagulation will need to be resumed ultimately after cardiac catheterization. CHADSVASC score of 7.  Wife mentioned that Coumadin would be preferred for anticoagulation, will start wafarin per pharmacy. Change to Plavix today.   3. Ischemic Cardiomyopathy: Continue losartan and metoprolol.   4. COPD- GOLD III: Continue current management.  5. Renal insufficiency:  Creatinine is 1.36 today. Stable post procedure.    Signed, Arbutus Leas, NP  12/06/2016, 11:26 AM   Patient seen and examined and history reviewed. Agree with above findings and plan. Patient is doing very well with no angina. Right radial site is without hematoma. Labs are stable. Discussed anticoagulation and he would prefer to go back on coumadin. He has taken this in the past. Can't afford Xarelto. Because of this will switch Brilinta to Plavix. Would consider stopping ASA in one to three months to minimize risk of bleeding. He is stable for DC today.  Khasir Woodrome [place], [place] 12/06/2016  12:30 PM

## 2016-12-06 NOTE — Care Management Note (Signed)
Case Management Note  Patient Details  Name: Rodney Orozco MRN: [id number] Date of Birth: 11-Sep-1941  Subjective/Objective:     NCM gave patient the 30 day savings card and he will go to [facility] in [place] to get 30 day free and they do have in stock.  Patient states he does not need any HH services. He is for possible dc today.                               Action/Plan:   Expected Discharge Date:                  Expected Discharge Plan:  Home/Self Care  In-House Referral:  NA  Discharge planning Services  CM Consult  Post Acute Care Choice:  NA Choice offered to:  NA  DME Arranged:    DME Agency:     HH Arranged:    HH Agency:     Status of Service:  Completed, signed off  If discussed at [employer] of Stay Meetings, dates discussed:    Additional Comments:  Zenon Mayo, RN 12/06/2016, 10:17 AM

## 2016-12-06 NOTE — Progress Notes (Signed)
TR BAND REMOVAL  LOCATION:    right radial  DEFLATED PER PROTOCOL:    Yes.    TIME BAND OFF / DRESSING APPLIED:    2155     SITE UPON ARRIVAL:    Level 0  SITE AFTER BAND REMOVAL:    Level 0  CIRCULATION SENSATION AND MOVEMENT:    Within Normal Limits   Yes.    COMMENTS:   Pt tolerated well; post radial teaching done; pt will need reviewed prior to d/c related to dementia.

## 2016-12-06 NOTE — Discharge Summary (Signed)
Discharge Summary    Patient ID: Rodney Orozco,  MRN: [id number], DOB/AGE: 1941-03-26 76 y.o.  Admit date: 12/03/2016 Discharge date: 12/06/2016  Primary Care Provider: CYNTHIA BUTLER Primary Cardiologist: Has seen Dr. Gwenlyn Found in the past for AAA, wants to establish in [place].   Discharge Diagnoses    Principal Problem:   Chest pain Active Problems:   Pulmonary embolism without acute cor pulmonale ([place]) 2016 post op treated for a year   Paroxysmal atrial fibrillation ([place])   COPD GOLD III   AAA (abdominal aortic aneurysm) ([place]) s/p repair in oct 2017 by dr Scot Dock   Palpitations   Chronic combined systolic and diastolic CHF (congestive heart failure) (HCC)   AKI (acute kidney injury) ([place]) vs CKD   Cardiomyopathy ([place])   Elevated troponin I level   Acute coronary syndrome (HCC)   Allergies Allergies  Allergen Reactions  . Codeine Other (See Comments)    Hallucinations  . Tramadol Dermatitis  . Lipitor [Atorvastatin] Other (See Comments)    Joint pain  . Penicillins Rash    HIves/rash Has patient had a PCN reaction causing immediate rash, facial/tongue/throat swelling, SOB or lightheadedness with hypotension: Yes Has patient had a PCN reaction causing severe rash involving mucus membranes or skin necrosis: No Has patient had a PCN reaction that required hospitalization No Has patient had a PCN reaction occurring within the last 10 years: No If all of the above answers are "NO", then may proceed with Cephalosporin use.     Diagnostic Studies/Procedures    Transthoracic Echocardiography 12/04/16 Study Conclusions  - Left ventricle: The cavity size was normal. Wall thickness was   increased in a pattern of moderate LVH. Systolic function was   mildly to moderately reduced. The estimated ejection fraction was   45%. Diffuse hypokinesis. Doppler parameters are consistent with   abnormal left ventricular relaxation (grade 1 diastolic   dysfunction). -  Aortic valve: Mildly calcified annulus. Trileaflet; mildly   calcified leaflets. There was trivial regurgitation. - Mitral valve: Calcified annulus. There was trivial regurgitation. - Left atrium: The atrium was mildly dilated. - Right atrium: Central venous pressure (est): 3 mm Hg. - Atrial septum: No defect or patent foramen ovale was identified. - Tricuspid valve: There was trivial regurgitation. - Pulmonary arteries: PA peak pressure: 31 mm Hg (S). - Pericardium, extracardiac: There was no pericardial effusion.  Impressions:  - Moderate LVH with LVEF approximately 45%. Overall diffuse   hypokinesis. Grade 1 diastolic dysfunction. Mild left atrial   enlargement. Mildly calcified mitral annulus with trivial mitral   regurgitation. Sclerotic aortic valve with trivial aortic   regurgitation. Trivial tricuspid regurgitation with PASP   estimated 31 mmHg.  _____________   History of Present Illness     Rodney Orozco is a 76 year old male with a past medical history of PAF, AAA s/p repair in Oct. 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 He presented to [facility] on 12/03[facility]16 with palpitations, chest tightness and SOB.   His troponin was elevated at 0.17>>0.17>>0.16. His cardiac markers argued against ACS however, he has atherosclerosis at baseline and plans were made to transfer him to [facility] for heart catheterization.   Hospital Course     Left and right heart cath showed a 90% stenosed posterolateral artery. A DES was placed there. He also had a 95% stenosis to his mid RCA that was treated with  a DES.   He was initially started on Brilinta, however with his atrial fibrillation he will need to be started back on anticoagulation (Xarelto was stopped in Dec. 2017 for cost reasons). He wished to start on Coumadin. He was therefore switched to Plavix (loaded with '300mg'$  prior to discharge)  and will be on ASA + Plavix + Coumadin for 30 days. He will then drop the ASA in 1-3 months and remain on Plavix and Coumadin.   His right radial site was stable without hematoma. We will have his INR checked on Monday 12/11/16 since he restarted the Coumadin and also he will follow up in our [place] office.  _____________  Discharge Vitals Blood pressure (!) 167/75, pulse 71, temperature 98.3 F (36.8 C), temperature source Oral, resp. rate 20, height '5\' 10"'$  (1.778 m), weight 231 lb 14.8 oz (105.2 kg), SpO2 94 %.  Filed Weights   12/03/16 2230 12/04/16 0645 12/06/16 0155  Weight: 228 lb (103.4 kg) 230 lb 9.6 oz (104.6 kg) 231 lb 14.8 oz (105.2 kg)    Labs & Radiologic Studies     CBC  Recent Labs  12/03/16 1738 12/06/16 0225  WBC 14.6* 16.0*  HGB 14.1 13.5  HCT 42.7 40.2  MCV 89.7 87.6  PLT 325 967   Basic Metabolic Panel  Recent Labs  12/05/16 0502 12/06/16 0225  NA 139 137  K 3.6 3.3*  CL 104 105  CO2 26 22  GLUCOSE 155* 197*  BUN 16 12  CREATININE 1.50* 1.36*  CALCIUM 9.1 9.3   Cardiac Enzymes  Recent Labs  12/03/16 2226 12/04/16 0140 12/04/16 0454  TROPONINI 0.17* 0.17* 0.16*   Thyroid Function Tests  Recent Labs  12/03/16 1738  TSH 2.177    Dg Chest 2 View  Result Date: 12/03/2016 CLINICAL DATA:  Shortness of breath, arrhythmia EXAM: CHEST  2 VIEW COMPARISON:  08/31/2016 FINDINGS: Borderline cardiomegaly. Central mild vascular congestion without pulmonary edema. Calcified pleural and diaphragmatic plaques again noted. No segmental infiltrate. Elevation of the right hemidiaphragm again noted. Osteopenia and mild degenerative changes thoracic spine. IMPRESSION: Central mild vascular congestion without pulmonary edema. Calcified pleural and diaphragmatic plaques again noted. No segmental infiltrate. Elevation of the right hemidiaphragm again noted. Osteopenia and mild degenerative changes thoracic spine. Electronically Signed   By: Lahoma Crocker M.D.    On: 12/03/2016 19:00    Disposition   Pt is being discharged home today in good condition.  Follow-up Plans & Appointments    Follow-up Information    Jory Sims, NP Follow up on 12/11/2016.   Specialties:  Nurse Practitioner, Radiology, Cardiology Why:  at 8:40 for INR check, and 10:00 appt. with Arnold Long, NP for hospital follow up  Contact information: [place] [id number] [phone]          Discharge Instructions    Amb Referral to Cardiac Rehabilitation    Complete by:  As directed    Diagnosis:  Coronary Stents   Diet - low sodium heart healthy    Complete by:  As directed    Increase activity slowly    Complete by:  As directed       Discharge Medications   Current Discharge Medication List    START taking these medications   Details  amLODipine (NORVASC) 5 MG tablet Take 1 tablet (5 mg total) by mouth daily. Qty: 30 tablet, Refills: 12    clopidogrel (PLAVIX) 75 MG tablet Take 1 tablet (75 mg total) by mouth  daily. Qty: 30 tablet, Refills: 12    warfarin (COUMADIN) 5 MG tablet Take 1 tablet (5 mg total) by mouth daily. Qty: 30 tablet, Refills: 3      CONTINUE these medications which have NOT CHANGED   Details  aspirin EC 81 MG tablet Take 81 mg by mouth daily.    donepezil (ARICEPT) 10 MG tablet Take 1 tablet (10 mg total) by mouth at bedtime. Qty: 30 tablet, Refills: 12   Associated Diagnoses: Memory loss    famotidine (PEPCID) 20 MG tablet Take 1 tablet by mouth 2 (two) times daily.    furosemide (LASIX) 40 MG tablet Take 1 tablet by mouth 2 (two) times daily.    losartan (COZAAR) 100 MG tablet Take 100 mg by mouth daily.    metFORMIN (GLUCOPHAGE) 500 MG tablet Take 1,000 mg by mouth 2 (two) times daily.    metoprolol tartrate (LOPRESSOR) 25 MG tablet Take 1 tablet by mouth 2 (two) times daily.    Misc. Devices MISC Diclofenac 3% Baclofen 2% lidocaine 5% menthol 1%,  Apply 1-2 gm 3-4 times daily to right knee as  needed for pain    NOVOLIN N RELION 100 UNIT/ML injection Inject 10 Units into the skin 2 (two) times daily.     OXYGEN Inhale 2 L into the lungs at bedtime. 2lpm 24/7  DME- [employer]     VICTOZA 18 MG/3ML SOPN Inject 1.2 mg into the skin every morning.       STOP taking these medications     CARTIA XT 120 MG 24 hr capsule          Aspirin prescribed at discharge?  Yes High Intensity Statin Prescribed? (Lipitor 40-'80mg'$  or Crestor 20-'40mg'$ ): Yes Beta Blocker Prescribed? Yes For EF 40% or less, Was ACEI/ARB Prescribed? No: EF 45% ADP Receptor Inhibitor Prescribed? (i.e. Plavix etc.-Includes Medically Managed Patients): Yes For EF <40%, Aldosterone Inhibitor Prescribed? No: EF 45%  Was EF assessed during THIS hospitalization? Yes Was Cardiac Rehab II ordered? (Included Medically managed Patients): Yes   Outstanding Labs/Studies   BMP, INR Check   Duration of Discharge Encounter   Greater than 30 minutes including physician time.  Signed, Arbutus Leas NP 12/06/2016, 1:07 PM

## 2016-12-06 NOTE — Progress Notes (Signed)
CARDIAC REHAB PHASE I   PRE:  Rate/Rhythm: 77 SR  BP:  Sitting: 174/74        SaO2: 95 RA  MODE:  Ambulation: 400 ft   POST:  Rate/Rhythm: 90 SR  BP:  Sitting: 196/96         SaO2: 95 RA  Pt ambulated 400 ft on RA, handheld assist, steady gait, tolerated well.  Pt c/o mild DOE, denies cp, dizziness, declined rest stop. VSS. Completed PCI/stent education with pt and wife at bedside.  Reviewed risk factors, PCI book, anti-platelet therapy, stent card, activity restrictions, ntg, exercise, heart healthy diet, carb counting, CHF booklet and zone tool, sodium restrictions, daily weights, and phase 2 cardiac rehab. Pt and wife verbalized understanding. Pt agrees to phase 2 cardiac rehab referral, will send to [facility] per pt request. Pt to recliner after walk, call bell within reach.   [id number] Rodney Sciara, RN, BSN 12/06/2016 10:12 AM

## 2016-12-08 ENCOUNTER — Other Ambulatory Visit: Payer: Self-pay | Admitting: Cardiovascular Disease

## 2016-12-08 MED ORDER — NITROGLYCERIN 0.4 MG SL SUBL: 0.4000 mg | SUBLINGUAL_TABLET | SUBLINGUAL | 3 refills | Status: AC | PRN

## 2016-12-08 NOTE — Telephone Encounter (Signed)
New message      Pt had cath on tues.  He thought that he was getting a presc for nitro.  When he picked up his medication, nitro was not included.  Calling to see if he needs a presc for nitro.  If yes, please call it in to [facility] in [place].  Please let pt know if he needs to take nitro

## 2016-12-08 NOTE — Telephone Encounter (Signed)
Recent cath + stent. Rx(s) sent to pharmacy electronically for SL NTG 0.'4mg'$   LM for patient/wife that Rx was called in to pharmacy

## 2016-12-11 ENCOUNTER — Ambulatory Visit (INDEPENDENT_AMBULATORY_CARE_PROVIDER_SITE_OTHER): Payer: Medicare HMO | Admitting: Adult Health

## 2016-12-11 ENCOUNTER — Encounter: Payer: Self-pay | Admitting: Adult Health

## 2016-12-11 ENCOUNTER — Ambulatory Visit (INDEPENDENT_AMBULATORY_CARE_PROVIDER_SITE_OTHER): Payer: Medicare HMO | Admitting: *Deleted

## 2016-12-11 VITALS — BP 148/78 | HR 74 | Ht 70.0 in | Wt 224.0 lb

## 2016-12-11 DIAGNOSIS — I481 Persistent atrial fibrillation: Secondary | ICD-10-CM | POA: Diagnosis not present

## 2016-12-11 DIAGNOSIS — I4819 Other persistent atrial fibrillation: Secondary | ICD-10-CM

## 2016-12-11 DIAGNOSIS — I2510 Atherosclerotic heart disease of native coronary artery without angina pectoris: Secondary | ICD-10-CM | POA: Diagnosis not present

## 2016-12-11 DIAGNOSIS — I10 Essential (primary) hypertension: Secondary | ICD-10-CM | POA: Diagnosis not present

## 2016-12-11 DIAGNOSIS — I480 Paroxysmal atrial fibrillation: Secondary | ICD-10-CM

## 2016-12-11 DIAGNOSIS — I2699 Other pulmonary embolism without acute cor pulmonale: Secondary | ICD-10-CM

## 2016-12-11 DIAGNOSIS — Z7901 Long term (current) use of anticoagulants: Secondary | ICD-10-CM | POA: Diagnosis not present

## 2016-12-11 LAB — POCT INR: INR: 1.2

## 2016-12-11 NOTE — Progress Notes (Signed)
Name: Rodney Orozco    DOB: [birth year]  Age: 76 y.o.  MR#: [id number]       PCP:  [name], DO      Insurance: Payor: [employer]ICARE / Plan: [place] [employer] / Product Type: *No Product type* /   CC:   No chief complaint on file.   VS Vitals:   12/11/16 0838  BP: (!) 148/78  Pulse: 74  SpO2: 99%  Weight: 224 lb (101.6 kg)  Height: '5\' 10"'$  (1.778 m)    Weights Current Weight  12/11/16 224 lb (101.6 kg)  12/06/16 231 lb 14.8 oz (105.2 kg)  10/25/16 237 lb 3.2 oz (107.6 kg)    Blood Pressure  BP Readings from Last 3 Encounters:  12/11/16 (!) 148/78  12/06/16 (!) 167/75  10/25/16 (!) 165/86     Admit date:  (Not on file) Last encounter with RMR:  Visit date not found   Allergy Codeine; Tramadol; Lipitor [atorvastatin]; and Penicillins  Current Outpatient Prescriptions  Medication Sig Dispense Refill  . amLODipine (NORVASC) 5 MG tablet Take 1 tablet (5 mg total) by mouth daily. 30 tablet 12  . aspirin EC 81 MG tablet Take 81 mg by mouth daily.    . clopidogrel (PLAVIX) 75 MG tablet Take 1 tablet (75 mg total) by mouth daily. 30 tablet 12  . donepezil (ARICEPT) 10 MG tablet Take 1 tablet (10 mg total) by mouth at bedtime. 30 tablet 12  . famotidine (PEPCID) 20 MG tablet Take 1 tablet by mouth 2 (two) times daily.    . furosemide (LASIX) 40 MG tablet Take 1 tablet by mouth 2 (two) times daily.    Marland Kitchen losartan (COZAAR) 100 MG tablet Take 100 mg by mouth daily.    . metFORMIN (GLUCOPHAGE) 500 MG tablet Take 1,000 mg by mouth 2 (two) times daily.    . metoprolol tartrate (LOPRESSOR) 25 MG tablet Take 1 tablet by mouth 2 (two) times daily.    . Misc. Devices MISC Diclofenac 3% Baclofen 2% lidocaine 5% menthol 1%,  Apply 1-2 gm 3-4 times daily to right knee as needed for pain    . nitroGLYCERIN (NITROSTAT) 0.4 MG SL tablet Place 1 tablet (0.4 mg total) under the tongue every 5 (five) minutes as needed for chest pain. MAX 3 dose 25 tablet 3  . NOVOLIN N RELION 100 UNIT/ML  injection Inject 10 Units into the skin 2 (two) times daily.     . OXYGEN Inhale 2 L into the lungs at bedtime. 2lpm 24/7  DME- [employer]     . VICTOZA 18 MG/3ML SOPN Inject 1.2 mg into the skin every morning.     . warfarin (COUMADIN) 5 MG tablet Take 1 tablet (5 mg total) by mouth daily. 30 tablet 3   No current facility-administered medications for this visit.     Discontinued Meds:   There are no discontinued medications.  Patient Active Problem List   Diagnosis Date Noted  . Cardiomyopathy ([place])   . Elevated troponin I level   . Acute coronary syndrome ([place])   . Chest pain 12/03/2016  . Palpitations 12/03/2016  . Chronic combined systolic and diastolic CHF (congestive heart failure) ([place]) 12/03/2016  . AKI (acute kidney injury) ([place]) vs CKD 12/03/2016  . AAA (abdominal aortic aneurysm) [facility]) s/p repair in oct 2017 by dr Scot Dock 08/31/2016  . Long-term (current) use of anticoagulants 08/25/2016  . Central sleep apnea 08/08/2016  . COPD GOLD III 07/21/2016  . Paroxysmal atrial fibrillation (  [place]) 07/12/2016  . Left ventricular dysfunction 07/12/2016  . Acute combined systolic and diastolic heart failure ([place]) 07/12/2016  . Abdominal aortic aneurysm (AAA) ([place]) 07/12/2016  . Cognitive changes 04/25/2016  . Gastroesophageal reflux disease without esophagitis 07/29/2015  . Morbid obesity due to excess calories ([place]) 05/13/2015  . Primary osteoarthritis of right knee 02/09/2015  . Pulmonary embolism without acute cor pulmonale ([place]) 2016 post op treated for a year 02/09/2015  . Postoperative pulmonary embolism ([place]) 01/21/2015  . DM type 2 with diabetic dyslipidemia ([place]) 08/18/2014  . Other and unspecified hyperlipidemia 04/27/2014  . Influenza B 01/07/2014  . Streptococcus pneumoniae pneumonia ([place]) 01/06/2014  . Acute respiratory failure with hypoxia ([place]) 01/06/2014  . COPD with acute exacerbation ([place]) 01/06/2014  . Healthcare-associated pneumonia 01/06/2014  . Diabetes  mellitus without complication ([place])   . Hypertension   . Dyslipidemia     LABS    Component Value Date/Time   NA 137 12/06/2016 0225   NA 139 12/05/2016 0502   NA 140 12/04/2016 0454   K 3.3 (L) 12/06/2016 0225   K 3.6 12/05/2016 0502   K 2.9 (L) 12/04/2016 0454   CL 105 12/06/2016 0225   CL 104 12/05/2016 0502   CL 101 12/04/2016 0454   CO2 22 12/06/2016 0225   CO2 26 12/05/2016 0502   CO2 28 12/04/2016 0454   GLUCOSE 197 (H) 12/06/2016 0225   GLUCOSE 155 (H) 12/05/2016 0502   GLUCOSE 105 (H) 12/04/2016 0454   BUN 12 12/06/2016 0225   BUN 16 12/05/2016 0502   BUN 18 12/04/2016 0454   CREATININE 1.36 (H) 12/06/2016 0225   CREATININE 1.50 (H) 12/05/2016 0502   CREATININE 1.71 (H) 12/04/2016 0454   CALCIUM 9.3 12/06/2016 0225   CALCIUM 9.1 12/05/2016 0502   CALCIUM 8.9 12/04/2016 0454   GFRNONAA 50 (L) 12/06/2016 0225   GFRNONAA 44 (L) 12/05/2016 0502   GFRNONAA 38 (L) 12/04/2016 0454   GFRAA 58 (L) 12/06/2016 0225   GFRAA 52 (L) 12/05/2016 0502   GFRAA 44 (L) 12/04/2016 0454   CMP     Component Value Date/Time   NA 137 12/06/2016 0225   K 3.3 (L) 12/06/2016 0225   CL 105 12/06/2016 0225   CO2 22 12/06/2016 0225   GLUCOSE 197 (H) 12/06/2016 0225   BUN 12 12/06/2016 0225   CREATININE 1.36 (H) 12/06/2016 0225   CALCIUM 9.3 12/06/2016 0225   PROT 7.1 08/29/2016 1256   ALBUMIN 4.1 08/29/2016 1256   AST 20 08/29/2016 1256   ALT 14 (L) 08/29/2016 1256   ALKPHOS 93 08/29/2016 1256   BILITOT 0.7 08/29/2016 1256   GFRNONAA 50 (L) 12/06/2016 0225   GFRAA 58 (L) 12/06/2016 0225       Component Value Date/Time   WBC 16.0 (H) 12/06/2016 0225   WBC 14.6 (H) 12/03/2016 1738   WBC 12.5 (H) 09/01/2016 0500   HGB 13.5 12/06/2016 0225   HGB 14.1 12/03/2016 1738   HGB 11.9 (L) 09/01/2016 0500   HCT 40.2 12/06/2016 0225   HCT 42.7 12/03/2016 1738   HCT 36.7 (L) 09/01/2016 0500   MCV 87.6 12/06/2016 0225   MCV 89.7 12/03/2016 1738   MCV 91.1 09/01/2016 0500    Lipid  Panel  No results found for: CHOL, TRIG, HDL, CHOLHDL, VLDL, LDLCALC, LDLDIRECT  ABG    Component Value Date/Time   PHART 7.405 12/05/2016 1106   PCO2ART 36.9 12/05/2016 1106   PO2ART 69.0 (L) 12/05/2016 1106   HCO3  26.2 12/05/2016 1117   TCO2 28 12/05/2016 1117   ACIDBASEDEF 1.0 12/05/2016 1106   O2SAT 68.0 12/05/2016 1117     Lab Results  Component Value Date   TSH 2.177 12/03/2016   BNP (last 3 results) No results for input(s): BNP in the last 8760 hours.  ProBNP (last 3 results) No results for input(s): PROBNP in the last 8760 hours.  Cardiac Panel (last 3 results) No results for input(s): CKTOTAL, CKMB, TROPONINI, RELINDX in the last 72 hours.  Iron/TIBC/Ferritin/ %Sat No results found for: IRON, TIBC, FERRITIN, IRONPCTSAT   EKG Orders placed or performed during the hospital encounter of 12/03/16  . EKG 12-Lead  . EKG 12-Lead  . ED EKG within 10 minutes  . ED EKG within 10 minutes  . EKG 12-Lead immediately post procedure  . EKG 12-Lead  . EKG 12-Lead  . EKG 12-Lead  . EKG 12-Lead immediately post procedure  . EKG 12-Lead  . EKG     Prior Assessment and Plan Problem List as of 12/11/2016 Reviewed: 10/25/2016 10:09 AM by Melvenia Beam, MD     Cardiovascular and Mediastinum   Hypertension   Last Assessment & Plan 07/12/2016 Office Visit Written 07/12/2016 10:23 AM by Lorretta Harp, MD    History of hypertension blood pressure measured today at 128/70. He is on amlodipine, Cartia, losartan and metoprolol. I'm going to stop the amlodipine and increase the Cartia 120-180 mg a day.      Postoperative pulmonary embolism ([place])   Pulmonary embolism without acute cor pulmonale ([place]) 2016 post op treated for a year   Last Assessment & Plan 07/12/2016 Office Visit Written 07/12/2016 10:24 AM by Lorretta Harp, MD    History of pulmonary embolism in February 2016 after a right total knee replacement on Coumadin anticoagulation for 6 months.      Paroxysmal  atrial fibrillation [facility])   Last Assessment & Plan 07/12/2016 Office Visit Written 07/12/2016 10:25 AM by Lorretta Harp, MD    History of PAF during recent hospitalization at [facility] during an  episode of COPD exacerbation. He converted to normal sinus rhythm and was placed on Coumadin anticoagulation.      Left ventricular dysfunction   Last Assessment & Plan 07/12/2016 Office Visit Written 07/12/2016 10:25 AM by Lorretta Harp, MD    2-D echocardiogram at [facility] during recent hospitalization earlier this month revealed an ejection fraction of 40-45% with mild to moderate RV dysfunction.      Acute combined systolic and diastolic heart failure [facility])   Last Assessment & Plan 07/12/2016 Office Visit Written 07/12/2016 10:26 AM by Lorretta Harp, MD    The patient was hospitalized earlier this month with COPD exacerbation. 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 2-D echo revealed an EF of 45%. He remains on furosemide 40 mg by mouth twice a day and is aware of some restriction.      Abdominal aortic aneurysm (AAA) [facility])   Last Assessment & Plan 07/12/2016 Office Visit Written 07/12/2016 10:27 AM by Lorretta Harp, MD    History of abdominal aortic aneurysm recently measured at 5.7 cm by CT scan. We will remeasure by abdominal ultrasound. He probably will need this revascularized would be high-risk for an open procedure. Hopefully he can be done percutaneously with endoluminal stent graft. I'm going to get a pharmacologic Myoview stress test to risk stratify him and refer him to  Dr. Trula Slade for surgical evaluation.      AAA (abdominal aortic aneurysm) ([place]) s/p repair in oct 2017 by dr Scot Dock   Chronic combined systolic and diastolic CHF (congestive heart failure) ([place])   Cardiomyopathy ([place])   Acute coronary syndrome ([place])     Respiratory   Streptococcus pneumoniae pneumonia ([place])   Acute respiratory  failure with hypoxia [facility])   Last Assessment & Plan 07/21/2016 Office Visit Written 07/21/2016  3:32 PM by Tanda Rockers, MD    sats ok now on 2lpm and uses bipap/ 2lpm hs and should not consider changing rx until well out from AAA surgery > we can see him for this or let Dr Melina Copa handle it noting the bipap is per neurology, not sleep medicine here.       COPD with acute exacerbation ([place])   Healthcare-associated pneumonia   Influenza B   COPD GOLD III   Last Assessment & Plan 07/21/2016 Office Visit Written 07/21/2016  3:31 PM by Tanda Rockers, MD    Spirometry 07/21/2016  FEV1 1.05 (32%)  Ratio 39  But f/v not physiologic  Based on baseline activity tolerance he's likely truly at GOLD I/II and has presently Pt is Group B in terms of symptom/risk and laba/lama therefore appropriate rx at this point but already has home neb he's not using as not really limited from doing desired activities at present.    Main risksfor surgery is low Cabd / pain limiting deep breathing/ cough mechanics which I discussed at length in terms of how to minimize them  But they are not in my opinion prohibitive in setting of AAA nearing 6 cm diam due to risk of death from rupture  Discussed in detail all the  indications, usual  risks and alternatives  relative to the benefits with patient who agrees to proceed with surgical eval as planned  Total time devoted to counseling  = 35/8mreview case with pt/wife and [place]/ discussion of options/alternatives/ personally creating written instructions  in presence of pt  then going over those specific  Instructions directly with the pt including how to use all of the meds but in particular covering each new medication in detail and the difference between the maintenance/automatic meds and the prns using an action plan format for the latter.               Digestive   Gastroesophageal reflux disease without esophagitis     Endocrine   Diabetes mellitus  without complication ([place]   DM type 2 with diabetic dyslipidemia ([facility])     Musculoskeletal and Integument   Primary osteoarthritis of right knee     Genitourinary   AKI (acute kidney injury) ([place] vs CKD     Other   Dyslipidemia   Last Assessment & Plan 07/12/2016 Office Visit Written 07/12/2016 10:24 AM by JLorretta Harp MD    History of hyperlipidemia intolerant to statin therapy      Cognitive changes   Morbid obesity due to excess calories ([facility]   Last Assessment & Plan 07/21/2016 Office Visit Written 07/21/2016  3:33 PM by MTanda Rockers MD    Body mass index is 32.54   Lab Results  Component Value Date   TSH 0.772 04/25/2016     Contributing to gerd tendency/ doe/reviewed the need and the process to achieve and maintain neg calorie balance > defer f/u primary care including intermittently monitoring thyroid status  Other and unspecified hyperlipidemia   Central sleep apnea   Long-term (current) use of anticoagulants   Chest pain   Palpitations   Elevated troponin I level       Imaging: Dg Chest 2 View  Result Date: 12/03/2016 CLINICAL DATA:  Shortness of breath, arrhythmia EXAM: CHEST  2 VIEW COMPARISON:  08/31/2016 FINDINGS: Borderline cardiomegaly. Central mild vascular congestion without pulmonary edema. Calcified pleural and diaphragmatic plaques again noted. No segmental infiltrate. Elevation of the right hemidiaphragm again noted. Osteopenia and mild degenerative changes thoracic spine. IMPRESSION: Central mild vascular congestion without pulmonary edema. Calcified pleural and diaphragmatic plaques again noted. No segmental infiltrate. Elevation of the right hemidiaphragm again noted. Osteopenia and mild degenerative changes thoracic spine. Electronically Signed   By: Lahoma Crocker M.D.   On: 12/03/2016 19:00

## 2016-12-11 NOTE — Progress Notes (Signed)
Cardiology Office Note   Date:  12/11/2016   ID:  Rodney Orozco [birth year], MRN [id number]  PCP:  Rodney Graves, DO  Cardiologist:  [place]/ Jory Sims, NP   Chief Complaint  Patient presents with  . Coronary Artery Disease  . Atrial Fibrillation      History of Present Illness: Rodney Orozco is a 76 y.o. male who presents for posthospitalization follow-up after admission for chest pain, pulmonary emboli without acute cor pulmonology, history of COPD GOLD III, history of AAA status post repair October 2017, chronic palpitations, chronic combined systolic and diastolic heart failure. The patient presented to [facility] on 12/03/2016 with complaints of chest pain and palpitations. He had elevated troponin at 0.17.  He was subsequently transferred to [facility] for cardiac catheterization in the setting of recurrent chest pain. He left and right heart catheterization which revealed 90% stenosis posterior lateral artery. A DES was placed there. He also had a 95% stenosis to his mid RCA which was also treated with a drug-eluting stent. The patient was started on antiplatelet therapy with Brilinta, but with history of atrial fibrillation he was to start back on Xarelto (he had stopped it due to financial cost). He therefore was started on Coumadin. He was switched to Plavix 75 mg daily along with aspirin. He will stop aspirin and 30 days and remain on Plavix and Coumadin.  Echocardiogram completed during hospitalization, revealed reduced systolic function at 56%, diffuse hypokinesis, with Doppler parameters consistent with abnormal ventricular relaxation (grade 1 diastolic dysfunction).  He is without complaint today. He has been walking 15 minutes a day. He denies any bleeding issues dyspnea rapid heart rhythm or fatigue. His energy level hasn't gone back to normal but he states he's feeling much better than he did prior to having the intervention. He is not interested in  cardiac rehabilitation at this time.  Past Medical History:  Diagnosis Date  . AAA (abdominal aortic aneurysm) ([place]) 2017  . Asthma   . Cardiomyopathy ([place])   . Dementia   . Dyslipidemia   . HCAP (healthcare-associated pneumonia) 12/2013   01/06/2014  . Hypertension   . Mixed hyperlipidemia   . On home oxygen therapy    "2L at night" (08/31/2016)  . OSA on CPAP   . Osteoarthritis   . Paroxysmal atrial fibrillation ([facility])   . Pneumonia December 2014   Had hemoptysis and admitted at [facility].  . Pulmonary embolism ([place]) 12/2014   S/P knee replacement/notes 08/31/2016  . Stroke ([place])   . Type II diabetes mellitus ([place])   . Vitamin D deficiency     Past Surgical History:  Procedure Laterality Date  . ABDOMINAL AORTIC ANEURYSM REPAIR  08/31/2016  . ABDOMINAL AORTIC ENDOVASCULAR STENT GRAFT N/A 08/31/2016   Procedure: ABDOMINAL AORTIC ENDOVASCULAR STENT GRAFT and Open Exposure left common femoral artery;  Surgeon: Angelia Mould, MD;  Location: [facility] OR;  Service: Vascular;  Laterality: N/A;  . ABDOMINAL SURGERY  1999   "for aneurysm"  . BACK SURGERY    . CARDIAC CATHETERIZATION N/A 12/05/2016   Procedure: Right/Left Heart Cath and Coronary Angiography;  Surgeon: Jettie Booze, MD;  Location: [facility] CV LAB;  Service: Cardiovascular;  Laterality: N/A;  . CARDIAC CATHETERIZATION N/A 12/05/2016   Procedure: Coronary Stent Intervention;  Surgeon: Jettie Booze, MD;  Location: [facility] LAB;  Service: Cardiovascular;  Laterality: N/A;  . JOINT REPLACEMENT    . [place]  .  TOTAL KNEE ARTHROPLASTY Bilateral Feb 2016 - Dec 2016     Current Outpatient Prescriptions  Medication Sig Dispense Refill  . amLODipine (NORVASC) 5 MG tablet Take 1 tablet (5 mg total) by mouth daily. 30 tablet 12  . aspirin EC 81 MG tablet Take 81 mg by mouth daily.    . clopidogrel (PLAVIX) 75 MG tablet Take 1 tablet (75 mg total) by mouth daily. 30 tablet 12   . donepezil (ARICEPT) 10 MG tablet Take 1 tablet (10 mg total) by mouth at bedtime. 30 tablet 12  . famotidine (PEPCID) 20 MG tablet Take 1 tablet by mouth 2 (two) times daily.    . furosemide (LASIX) 40 MG tablet Take 1 tablet by mouth 2 (two) times daily.    Marland Kitchen losartan (COZAAR) 100 MG tablet Take 100 mg by mouth daily.    . metFORMIN (GLUCOPHAGE) 500 MG tablet Take 1,000 mg by mouth 2 (two) times daily.    . metoprolol tartrate (LOPRESSOR) 25 MG tablet Take 1 tablet by mouth 2 (two) times daily.    . Misc. Devices MISC Diclofenac 3% Baclofen 2% lidocaine 5% menthol 1%,  Apply 1-2 gm 3-4 times daily to right knee as needed for pain    . nitroGLYCERIN (NITROSTAT) 0.4 MG SL tablet Place 1 tablet (0.4 mg total) under the tongue every 5 (five) minutes as needed for chest pain. MAX 3 dose 25 tablet 3  . NOVOLIN N RELION 100 UNIT/ML injection Inject 10 Units into the skin 2 (two) times daily.     . OXYGEN Inhale 2 L into the lungs at bedtime. 2lpm 24/7  DME- [employer]     . VICTOZA 18 MG/3ML SOPN Inject 1.2 mg into the skin every morning.     . warfarin (COUMADIN) 5 MG tablet Take 1 tablet (5 mg total) by mouth daily. 30 tablet 3   No current facility-administered medications for this visit.     Allergies:   Codeine; Tramadol; Lipitor [atorvastatin]; and Penicillins    Social History:  The patient  reports that he quit smoking about 3 years ago. His smoking use included Cigarettes. He has a 100.00 pack-year smoking history. He has never used smokeless tobacco. He reports that he does not drink alcohol or use drugs.   Family History:  The patient's family history includes Dementia in his mother; Heart attack in his father; Stroke in his mother.    ROS: All other systems are reviewed and negative. Unless otherwise mentioned in H&P    PHYSICAL EXAM: VS:  BP (!) 148/78   Pulse 74   Ht '5\' 10"'$  (1.778 m)   Wt 224 lb (101.6 kg)   SpO2 99%   BMI 32.14 kg/m  , BMI Body mass index is 32.14  kg/m. GEN: Well nourished, well developed, in no acute distress  HEENT: normal  Neck: no JVD, carotid bruits, or masses Cardiac: IRRR; no murmurs, rubs, or gallops,no edema  Respiratory:  Clear to auscultation bilaterally, normal work of breathing GI: soft, nontender, nondistended, + BS MS: no deformity or atrophy  Skin: warm and dry, no rash. Multiple bruises noted on hands and arms from recent hospitalization and IV access. Neuro:  Strength and sensation are intact Psych: euthymic mood, full affect   Recent Labs: 08/29/2016: ALT 14 08/31/2016: Magnesium 1.9 12/03/2016: TSH 2.177 12/06/2016: BUN 12; Creatinine, Ser 1.36; Hemoglobin 13.5; Platelets 283; Potassium 3.3; Sodium 137    Lipid Panel No results found for: CHOL, TRIG, HDL, CHOLHDL, VLDL, LDLCALC, LDLDIRECT  Wt Readings from Last 3 Encounters:  12/11/16 224 lb (101.6 kg)  12/06/16 231 lb 14.8 oz (105.2 kg)  10/25/16 237 lb 3.2 oz (107.6 kg)      Other studies Reviewed: Additional studies/ records that were reviewed today include:  Cardiac Cath Conclusion 12/05/2016    Posterolateral artery, 90 %stenosed. A STENT RESOLUTE ONYX 3.0X12 drug eluting stent was successfully placed, post dilated to 3.3 mm.  Post intervention, there is a 0% residual stenosis.  Ectatic RCA. Mid RCA lesion, 95 %stenosed. A STENT RESOLUTE ONYX 5.0X12 drug eluting stent was successfully placed.  Post intervention, there is a 0% residual stenosis.  Mid Cx lesion, 80 %stenosed. This is a relatively small vessel.  Ost Cx lesion, 50 %stenosed.  Ost LAD to Mid LAD lesion, 25 %stenosed.  RPDA lesion, 60-70 %stenosed. Given our efforts to limit contrast dye, we did not further evaluate this lesion.  LV end diastolic pressure is normal.  LV end diastolic pressure is normal.  There is no pulmonic valve stenosis.  Normal right heart pressures.  PA sat 68%. CO 5.9 L/min. CI 2.67.   He will need dual antiplatelet therapy for 12 months.   In speaking to the wife, she mentioned that if anticoagulation was needed, COumadin would be preferred.  If he is started on Coumadin or any other anticoagulation for PAF, would change Brilinta to Plavix.  Would give Plavix 300 mg on the first day followed by 75 mg daily.   Very large right coronary artery.  Hopefully, revascularization will help his overall ejection fraction. There is some significant disease in a relatively small circumflex vessel. I think this would be suitable for medical therapy. Moderate lesion in the mid PDA. This was deferred to avoid giving excessive contrast. This could be considered for intervention in the future if required.    Echocardiogram 12/13/2015 Study Conclusions  - Left ventricle: The cavity size was normal. Wall thickness was   increased in a pattern of moderate LVH. Systolic function was   mildly to moderately reduced. The estimated ejection fraction was   45%. Diffuse hypokinesis. Doppler parameters are consistent with   abnormal left ventricular relaxation (grade 1 diastolic   dysfunction). - Aortic valve: Mildly calcified annulus. Trileaflet; mildly   calcified leaflets. There was trivial regurgitation. - Mitral valve: Calcified annulus. There was trivial regurgitation. - Left atrium: The atrium was mildly dilated. - Right atrium: Central venous pressure (est): 3 mm Hg. - Atrial septum: No defect or patent foramen ovale was identified. - Tricuspid valve: There was trivial regurgitation. - Pulmonary arteries: PA peak pressure: 31 mm Hg (S). - Pericardium, extracardiac: There was no pericardial effusion.   ASSESSMENT AND PLAN:  1.  Coronary artery disease: Status post drug-eluting stent to the mid posterior lateral and right coronary artery. The patient will stop taking aspirin on 01/05/2017. Continue Coumadin and Plavix. He is to report any excessive bleeding hemoptysis or melena. He is not interested in cardiac rehabilitation at this time  although this has been discussed with him. I've given him a copy of his cardiac catheterization diagram, with before and after documentation, for his own records of where stents are placed. He will continue metoprolol 25 mg twice a day and ARB. Follow-up BMET will be ordered.  2. Atrial fibrillation: Heart rate is well controlled today. He is tolerating Coumadin. He is going to have PT/INR checked today and our [place] office clinic.  3. Hypertension: Essentially controlled today. Better then when he was in the  hospital. Would like to see this lower, but I believe this is related to office appointment only. We'll continue to monitor. If remains elevated we'll need to increase dose of losartan, or amlodipine. For now we will continue to follow.  4. History of obstructive sleep apnea: Compliant with CPAP.  5. History of chronic combined CHF: The patient will continue on furosemide 40 mg twice a day. Follow-up BMET is ordered.   Current medicines are reviewed at length with the patient today.    Labs/ tests ordered today include:   Orders Placed This Encounter  Procedures  . Basic Metabolic Panel (BMET)     Disposition:   FU with 3 months with Dr. Gwenlyn Found (patient's request) in [place].  Signed, Jory Sims, NP  12/11/2016 9:01 AM    [facility] [address] [place], [place] [address] Phone: [phone]; Fax: [phone]

## 2016-12-11 NOTE — Patient Instructions (Signed)
Your physician recommends that you schedule a follow-up appointment in: 3 Months with Dr. Gwenlyn Found   Your physician recommends that you continue on your current medications as directed. Please refer to the Current Medication list given to you today.  Your physician recommends that you return for lab work in: 2 weeks  STOP Aspirin on February 23   If you need a refill on your cardiac medications before your next appointment, please call your pharmacy.  Thank you for choosing [facility]!

## 2016-12-17 ENCOUNTER — Encounter: Payer: Self-pay | Admitting: Adult Health

## 2016-12-19 ENCOUNTER — Encounter: Payer: Self-pay | Admitting: Neurology

## 2016-12-19 ENCOUNTER — Ambulatory Visit (INDEPENDENT_AMBULATORY_CARE_PROVIDER_SITE_OTHER): Payer: Medicare HMO | Admitting: Neurology

## 2016-12-19 ENCOUNTER — Ambulatory Visit (INDEPENDENT_AMBULATORY_CARE_PROVIDER_SITE_OTHER): Payer: Medicare HMO | Admitting: *Deleted

## 2016-12-19 VITALS — BP 131/73 | HR 48 | Resp 20 | Ht 70.0 in | Wt 222.0 lb

## 2016-12-19 DIAGNOSIS — Z7901 Long term (current) use of anticoagulants: Secondary | ICD-10-CM

## 2016-12-19 DIAGNOSIS — I480 Paroxysmal atrial fibrillation: Secondary | ICD-10-CM | POA: Diagnosis not present

## 2016-12-19 DIAGNOSIS — G4733 Obstructive sleep apnea (adult) (pediatric): Secondary | ICD-10-CM

## 2016-12-19 DIAGNOSIS — I2699 Other pulmonary embolism without acute cor pulmonale: Secondary | ICD-10-CM

## 2016-12-19 LAB — POCT INR: INR: 1.5

## 2016-12-19 NOTE — Progress Notes (Signed)
Subjective:    Orozco ID: Rodney Orozco is a 76 y.o. male.  HPI     Interim history:   Rodney Orozco is a 76 year old right-handed gentleman with an underlying medical history of diabetes, history of pneumonia, hypertension, hyperlipidemia, arthritis, status post knee surgeries (TKA in Feb. and Dec. 2016), s/p low back surgery, status post remote abdominal surgery, remote history of smoking but previously heavy smoking history (quit about 4 years ago), and obesity, who presents for follow-up consultation of Rodney Orozco obstructive sleep apnea, on treatment with BiPAP. Rodney Orozco is accompanied by Rodney Orozco wife today. I first met him on 05/15/2016 at Rodney request of Dr. Jaynee Eagles, at which time Rodney Orozco reported snoring and excessive daytime somnolence. Rodney Orozco had recently started Aricept for memory loss. I invited him for sleep study. Rodney Orozco had a baseline sleep study, followed by a CPAP titration study. I went over Rodney Orozco test results with him in detail today. Rodney Orozco attempted split-night sleep study from 05/30/2016 showed a sleep efficiency of 81.2% with a latency to sleep of 9 minutes and wake after sleep onset of 24.5 minutes with severe sleep fragmentation noted. Rodney Orozco had an increased percentage of stage I sleep, slow-wave sleep at 6.6%, REM sleep of 11%, REM latency at 151.5 minutes. Rodney Orozco had no significant PLMS, EKG showed PVCs and PACs. Moderate snoring was noted. Total AHI was 25.2 per hour, average oxygen saturation was 85% only, nadir was 72%. Rodney Orozco was tried on CPAP therapy starting at 5 cm but Rodney Orozco had significant worsening of central apneas and Rodney Orozco was started on BiPAP therapy with BiPAP ST and oxygen was added as well. Based on Rodney Orozco test results I invited him for a full night BiPAP titration study. Rodney Orozco had this on 06/16/2014. Sleep efficiency was 34.4%, sleep latency was 8 minutes but wake after sleep onset was 247 minutes with severe sleep fragmentation noted and longer periods of wakefulness multiple times. Rodney Orozco had an elevated  arousal index. Rodney Orozco had slow-wave sleep at 7.1%, increase of stage I sleep and absence of REM sleep. Rodney Orozco had occasional PVCs and PACs on EKG, no significant PLMS. Rodney Orozco was tried on BiPAP of 8 over 4 and a backup rate of 16 was added. Rodney Orozco was eventually switched to ASV and even with ASV Rodney Orozco did not have a successful titration, oxygen was added at 1 L and then increase to 2 L/m. I started him on empiric BiPAP therapy with supplemental oxygen. I referred him to pulmonology and Rodney Orozco saw Dr. Melvyn Novas.  Today, 12/19/2016 (all dictated new, as well as above notes, some dictation done in note pad or Word, outside of chart, may appear as copied):   I reviewed Rodney Orozco BiPAP compliance data from 11/18/2016 through 12/17/2016, which is a total of 30 days, during which time Rodney Orozco used Rodney Orozco BiPAP every night except for 2, percent used days greater than 4 hours was 83%, indicating very good compliance with an average usage of 6 hours and 57 minutes, residual AHI 2.3 per hour, leak on Rodney high side with Rodney 95th percentile at 25.5 L/m on a pressure of 13/9 cm. I reviewed Rodney Orozco BiPAP compliance data from 07/08/2016 through 08/06/2016 which is a total of 30 days, during which time Rodney Orozco used Rodney Orozco machine every night with percent used days greater than 4 hours at 97%, indicating excellent compliance with an average usage of 7 hours and 33 minutes, residual AHI at 2.3 per hour, leak at times high with Rodney 95th percentile at 35.7 L/m. Rodney Orozco saw  in Rodney interim [name] on 08/08/2016 and I reviewed that note. Rodney Orozco was advised to get a mask refit. Rodney Orozco saw Dr. Jaynee Eagles on 10/25/2016 for memory loss and follow-up and I reviewed Rodney note. Rodney Orozco reports doing okay. In Rodney interim, Rodney Orozco was in Rodney hospital recently for palpitations in January 2018. Rodney Orozco had 2 stents placements. I reviewed Rodney records including discharge summary. Rodney Orozco had Rodney Orozco abdominal aortic aneurysm repaired in October 2017 in Rodney interim as well. Rodney Orozco sleeps on Rodney Orozco L sided, R groin healed well from Rodney Orozco procedure  for AAA repair, they went through Rodney R radial for stents. Supposed to stop ASA later this month. Coumadin level not quite in therapeutic range. Continues to use O2 at 2 lpm at night with Rodney BiPAP. Doing well sleep wise, motivated to continue.  Rodney Orozco's allergies, current medications, family history, past medical history, past social history, past surgical history and problem list were reviewed and updated as appropriate.   Previously (copied from previous notes for reference):   05/15/2016: Rodney Orozco reports snoring and excessive daytime somnolence. I reviewed your office note from 04/25/2016. Orozco has a history of memory loss. Rodney Orozco also has a history of B12 deficiency. You have started him on Aricept recently. Rodney Orozco Epworth sleepiness score is 16 out of 24 today, Rodney Orozco fatigue score is 44 out of 63. Rodney Orozco goes to bed around 10 PM and falls asleep fairly quickly. Wake up time is around 4:30 to 5 AM. They are early risers. Rodney Orozco does get up to use Rodney bathroom about 3-4 times on an average night. Rodney Orozco has fairly frequent morning headaches and often takes [place] powder every day. Rodney Orozco does not have any restless leg symptoms and is not known to twitch Rodney Orozco legs or cake and Rodney Orozco sleep according to Rodney wife. She has witnessed breathing pauses while Rodney Orozco is asleep. Rodney Orozco takes a nap usually once in Rodney afternoon after lunch and sleeps for about an hour. Rodney Orozco has a family history of obstructive sleep apnea in 3 sisters who all have CPAP machines. Rodney Orozco would be willing to try CPAP himself. Rodney Orozco does not watch TV at night. Rodney Orozco quit smoking about 4 years ago. Rodney Orozco is retired and lives with Rodney Orozco wife. They have grown children, one next door. Rodney Orozco has been tolerating Aricept at 10 mg once daily.  Rodney Orozco Past Medical History Is Significant For: Past Medical History:  Diagnosis Date  . AAA (abdominal aortic aneurysm) ([place]) 2017  . Asthma   . Cardiomyopathy ([place])   . Dementia   . Dyslipidemia   . HCAP (healthcare-associated pneumonia) 12/2013    01/06/2014  . Hypertension   . Mixed hyperlipidemia   . On home oxygen therapy    "2L at night" (08/31/2016)  . OSA on CPAP   . Osteoarthritis   . Paroxysmal atrial fibrillation ([facility])   . Pneumonia December 2014   Had hemoptysis and [facility] at [facility].  . Pulmonary embolism ([place]) 12/2014   S/P knee replacement/notes 08/31/2016  . Stroke ([place])   . Type II diabetes mellitus ([place])   . Vitamin D deficiency     Rodney Orozco Past Surgical History Is Significant For: Past Surgical History:  Procedure Laterality Date  . ABDOMINAL AORTIC ANEURYSM REPAIR  08/31/2016  . ABDOMINAL AORTIC ENDOVASCULAR STENT GRAFT N/A 08/31/2016   Procedure: ABDOMINAL AORTIC ENDOVASCULAR STENT GRAFT and Open Exposure left common femoral artery;  Surgeon: Angelia Mould, MD;  Location: [facility];  Service: Vascular;  Laterality: N/A;  . ABDOMINAL  SURGERY  1999   "for aneurysm"  . BACK SURGERY    . CARDIAC CATHETERIZATION N/A 12/05/2016   Procedure: Right/Left Heart Cath and Coronary Angiography;  Surgeon: Jettie Booze, MD;  Location: [facility] CV LAB;  Service: Cardiovascular;  Laterality: N/A;  . CARDIAC CATHETERIZATION N/A 12/05/2016   Procedure: Coronary Stent Intervention;  Surgeon: Jettie Booze, MD;  Location: [place] CV [facility];  Service: Cardiovascular;  Laterality: N/A;  . JOINT REPLACEMENT    . [place]  . TOTAL KNEE ARTHROPLASTY Bilateral Feb 2016 - Dec 2016    Rodney Orozco Family History Is Significant For: Family History  Problem Relation Age of Onset  . Dementia Mother   . Stroke Mother   . Heart attack Father     Rodney Orozco Social History Is Significant For: Social History   Social History  . Marital status: Married    Spouse name: [name]  . Number of children: 3  . Years of education: 40   Occupational History  . Retired    Social History Main Topics  . Smoking status: Former Smoker    Packs/day: 2.00    Years: 50.00    Types: Cigarettes    Quit date:  11/07/2013  . Smokeless tobacco: Never Used  . Alcohol use No  . Drug use: No  . Sexual activity: Not Asked   Other Topics Concern  . None   Social History Narrative   Lives with wife   Caffeine use: 4 cups /day (coffee)    Rodney Orozco Allergies Are:  Allergies  Allergen Reactions  . Codeine Other (See Comments)    Hallucinations  . Tramadol Dermatitis  . Lipitor [Atorvastatin] Other (See Comments)    Joint pain  . Penicillins Rash    HIves/rash Has Orozco had a PCN reaction causing immediate rash, facial/tongue/throat swelling, SOB or lightheadedness with hypotension: Yes Has Orozco had a PCN reaction causing severe rash involving mucus membranes or skin necrosis: No Has Orozco had a PCN reaction that required hospitalization No Has Orozco had a PCN reaction occurring within Rodney last 10 years: No If all of Rodney above answers are "NO", then may proceed with Cephalosporin use.   :   Rodney Orozco Current Medications Are:  Outpatient Encounter Prescriptions as of 12/19/2016  Medication Sig  . amLODipine (NORVASC) 5 MG tablet Take 1 tablet (5 mg total) by mouth daily.  Marland Kitchen aspirin EC 81 MG tablet Take 81 mg by mouth daily.  . clopidogrel (PLAVIX) 75 MG tablet Take 1 tablet (75 mg total) by mouth daily.  Marland Kitchen donepezil (ARICEPT) 10 MG tablet Take 1 tablet (10 mg total) by mouth at bedtime.  . famotidine (PEPCID) 20 MG tablet Take 1 tablet by mouth 2 (two) times daily.  . furosemide (LASIX) 40 MG tablet Take 1 tablet by mouth 2 (two) times daily.  Marland Kitchen losartan (COZAAR) 100 MG tablet Take 100 mg by mouth daily.  . metFORMIN (GLUCOPHAGE) 500 MG tablet Take 1,000 mg by mouth 2 (two) times daily.  . metoprolol tartrate (LOPRESSOR) 25 MG tablet Take 1 tablet by mouth 2 (two) times daily.  . Misc. Devices MISC Diclofenac 3% Baclofen 2% lidocaine 5% menthol 1%,  Apply 1-2 gm 3-4 times daily to right knee as needed for pain  . nitroGLYCERIN (NITROSTAT) 0.4 MG SL tablet Place 1 tablet (0.4 mg total) under  Rodney tongue every 5 (five) minutes as needed for chest pain. MAX 3 dose  . NOVOLIN N RELION 100 UNIT/ML injection  Inject 10 Units into Rodney skin 2 (two) times daily.   . OXYGEN Inhale 2 L into Rodney lungs at bedtime. 2lpm 24/7  DME- [employer]   . VICTOZA 18 MG/3ML SOPN Inject 1.2 mg into Rodney skin every morning.   . warfarin (COUMADIN) 5 MG tablet Take 1 tablet (5 mg total) by mouth daily.   No facility-administered encounter medications on file as of 12/19/2016.   :  Review of Systems:  Out of a complete 14 point review of systems, all are reviewed and negative with Rodney exception of these symptoms as listed below: Review of Systems  Neurological:       Pt presents today to discuss Rodney Orozco bipap usage. Pt has been hospitalized recently and has had a heart catheterization.     Objective:  Neurologic Exam  Physical Exam Physical Examination:   Vitals:   12/19/16 1006  BP: 131/73  Pulse: (!) 48  Resp: 20    General Examination: Rodney Orozco is a very pleasant 76 y.o. male in no acute distress. Rodney Orozco appears well-developed and well-nourished and well groomed.   HEENT: Normocephalic, atraumatic, pupils are equal, round and reactive to light and accommodation. Extraocular tracking is good without limitation to gaze excursion or nystagmus noted. Normal smooth pursuit is noted. Hearing is Mildly impaired, Rodney Orozco has hearing aids. Face is symmetric, normal facial sensation noted, speech is clear, maybe mildly hypophonic but no dysarthria. Neck is supple, Rodney Orozco has on oropharynx exam mild to moderate mouth dryness, adequate dental hygiene and moderate airway crowding. Tongue and palate move normally and symmetrically.  Chest: Clear to auscultation without wheezing, rhonchi or crackles noted.  Heart: S1+S2+0, regular and normal without murmurs, rubs or gallops noted.   Abdomen: Soft, non-tender and non-distended with normal bowel sounds appreciated on auscultation.  Extremities: There is no pitting edema in Rodney  distal lower extremities bilaterally. Pedal pulses are intact.  Skin: Warm and dry without trophic changes noted. Bruising noted. On Coumadin and ASA and Plavix still.  Musculoskeletal: exam reveals no obvious joint deformities, tenderness or joint swelling or erythema. Perhaps mild decrease in range of motion in both knees, status post knee replacement surgeries. Rodney Orozco reports no significant back pain. Rodney Orozco is status post back surgery.  Neurologically:  Mental status: Rodney Orozco is awake, alert and oriented in all 4 spheres. Rodney Orozco immediate and remote memory, attention, language skills and fund of knowledge are mildly impaired. Rodney Orozco wife has a tendency to provide more detailed information. There is no evidence of aphasia, agnosia, apraxia or anomia. Speech is clear with normal prosody and enunciation. Thought process is linear. Mood is normal and affect is normal.  Cranial nerves II - XII are as described above under HEENT exam. In addition: shoulder shrug is normal with equal shoulder height noted. Motor exam: Normal bulk, strength and tone is noted. There is no drift, tremor or rebound. Reflexes are 1+ in Rodney UEs and absent in Rodney LEs. Fine motor skills and coordination: intact with normal finger taps, normal hand movements, normal rapid alternating patting, normal foot taps and normal foot agility.  Cerebellar testing: No dysmetria or intention tremor on finger to nose testing. Heel to shin is unremarkable bilaterally. There is no truncal or gait ataxia.  Sensory exam: intact to light touch in Rodney upper and lower extremities.  Gait, station and balance: Rodney Orozco stands easily. No veering to one side is noted. No leaning to one side is noted. Posture is age-appropriate and stance is very mildly wider based  naturally. Romberg and tandem walk were not testable safely. Otherwise gait is unremarkable, no limp.  Assessment and Plan:  In summary, YASUO PHIMMASONE is a very pleasant 76 y.o.-year old male withAn  underlying complex medical history of memory loss, diabetes, hypertension, hyperlipidemia, arthritis, joint and back surgeries, recent aortic aneurysm repair, recent coronary stent placements, history of pneumonia and obesity, who presents for follow-up consultation of Rodney Orozco moderate to severe obstructive sleep apnea, now established on BiPAP therapy and supplemental oxygen. Rodney Orozco is compliant with treatment and commended for this. Rodney Orozco's doing well and sleeps better. Rodney Orozco is motivated to continue with treatment and has thankfully recuperated well from Rodney Orozco recent surgeries. We talked again about Rodney link between obstructive sleep apnea and heart disease as well as congestive heart failure and cardiac arrhythmias including A. fib. Rodney Orozco is advised to continue with BiPAP with full compliance. Rodney Orozco is commended for Rodney Orozco treatment adherence. Physical exam is stable. I suggested a 6-8 month follow-up, Rodney Orozco has an appointment pending with Megan, nausea practitioner, Rodney Orozco can keep that appointment and I will see him back after that for sleep apnea. I explained Rodney importance of compliance with BiPAP treatment. Rodney Orozco uses supplemental oxygen with no problems. Rodney Orozco needs a new headgear and mask and I wrote a prescription for that. I answered all their questions today and Rodney Orozco and Rodney Orozco wife were in agreement.  I spent 25 minutes in total face-to-face time with Rodney Orozco, more than 50% of which was spent in counseling and coordination of care, reviewing test results, reviewing medication and discussing or reviewing Rodney diagnosis of OSA, its prognosis and treatment options. Pertinent laboratory and imaging test results that were available during this visit with Rodney Orozco were reviewed by me and considered in my medical decision making (see chart for details).

## 2016-12-19 NOTE — Patient Instructions (Addendum)
Keep your appt with Ward Givens, NP on 07/26/17 at 11 AM. Please continue using your BiPAP regularly. While your insurance requires that you use BiPAP at least 4 hours each night on 70% of the nights, I recommend, that you not skip any nights and use it throughout the night if you can. Getting used to BiPAP and staying with the treatment long term does take time and patience and discipline. Untreated obstructive sleep apnea when it is moderate to severe can have an adverse impact on cardiovascular health and raise her risk for heart disease, arrhythmias, hypertension, congestive heart failure, stroke and diabetes. Untreated obstructive sleep apnea causes sleep disruption, nonrestorative sleep, and sleep deprivation. This can have an impact on your day to day functioning and cause daytime sleepiness and impairment of cognitive function, memory loss, mood disturbance, and problems focussing. Using PAP regularly can improve these symptoms. Keep up the good work! We will see you back in about 7 months for sleep apnea check up.

## 2016-12-26 ENCOUNTER — Ambulatory Visit (INDEPENDENT_AMBULATORY_CARE_PROVIDER_SITE_OTHER): Payer: Medicare HMO | Admitting: *Deleted

## 2016-12-26 DIAGNOSIS — Z7901 Long term (current) use of anticoagulants: Secondary | ICD-10-CM | POA: Diagnosis not present

## 2016-12-26 DIAGNOSIS — I480 Paroxysmal atrial fibrillation: Secondary | ICD-10-CM | POA: Diagnosis not present

## 2016-12-26 DIAGNOSIS — I2699 Other pulmonary embolism without acute cor pulmonale: Secondary | ICD-10-CM

## 2016-12-26 LAB — POCT INR: INR: 1.5

## 2017-01-02 ENCOUNTER — Ambulatory Visit (INDEPENDENT_AMBULATORY_CARE_PROVIDER_SITE_OTHER): Payer: Medicare HMO | Admitting: *Deleted

## 2017-01-02 DIAGNOSIS — Z7901 Long term (current) use of anticoagulants: Secondary | ICD-10-CM | POA: Diagnosis not present

## 2017-01-02 DIAGNOSIS — I2699 Other pulmonary embolism without acute cor pulmonale: Secondary | ICD-10-CM

## 2017-01-02 DIAGNOSIS — I480 Paroxysmal atrial fibrillation: Secondary | ICD-10-CM

## 2017-01-02 LAB — POCT INR: INR: 2.1

## 2017-01-02 MED ORDER — WARFARIN SODIUM 5 MG PO TABS: ORAL_TABLET | ORAL | 3 refills | Status: DC

## 2017-01-08 ENCOUNTER — Encounter (HOSPITAL_COMMUNITY)
Admission: RE | Admit: 2017-01-08 | Discharge: 2017-01-08 | Disposition: A | Discharge status: Home / Self Care | Payer: Medicare HMO | Source: Ambulatory Visit | Attending: *Deleted | Admitting: *Deleted

## 2017-01-08 ENCOUNTER — Other Ambulatory Visit (HOSPITAL_COMMUNITY): Payer: Self-pay | Admitting: *Deleted

## 2017-01-08 ENCOUNTER — Telehealth: Payer: Self-pay

## 2017-01-08 DIAGNOSIS — I714 Abdominal aortic aneurysm, without rupture, unspecified: Secondary | ICD-10-CM

## 2017-01-08 DIAGNOSIS — D500 Iron deficiency anemia secondary to blood loss (chronic): Secondary | ICD-10-CM | POA: Diagnosis present

## 2017-01-08 LAB — ABO/RH: ABO/RH(D): A NEG

## 2017-01-08 LAB — HEMOGLOBIN AND HEMATOCRIT, BLOOD
HCT: 23.2 % — ABNORMAL LOW (ref 39.0–52.0)
Hemoglobin: 7.5 g/dL — ABNORMAL LOW (ref 13.0–17.0)

## 2017-01-08 LAB — PREPARE RBC (CROSSMATCH)

## 2017-01-08 NOTE — Telephone Encounter (Addendum)
Phone call from pt's wife.  Reported hgb. of 8.0.  Stated the PCP evaluated him Thurs. 2/22, due to being extremely pale, weak and 02 level decreased.  Wife reported the pt. Has had "black" stools for about 2 weeks.  Wanted to make Dr. Scot Dock aware.  Stated that the pt. Is being scheduled for blood transfusion at [facility].   Per wife, the pt. has not c/o abdominal or back pain; no vomiting.  Advised that the PCP will need to further eval. The source of bleeding.  Verb. Understanding.

## 2017-01-09 NOTE — Progress Notes (Signed)
Results for DEON, DUER (MRN [id number]) as of 01/09/2017 09:46  Ref. Range 01/08/2017 14:28  Hemoglobin Latest Ref Range: 13.0 - 17.0 g/dL 7.5 (L)  HCT Latest Ref Range: 39.0 - 52.0 % 23.2 (L)

## 2017-01-10 ENCOUNTER — Encounter (HOSPITAL_COMMUNITY)
Admission: RE | Admit: 2017-01-10 | Discharge: 2017-01-10 | Disposition: A | Discharge status: Home / Self Care | Payer: Medicare HMO | Source: Ambulatory Visit | Attending: *Deleted | Admitting: *Deleted

## 2017-01-10 DIAGNOSIS — D500 Iron deficiency anemia secondary to blood loss (chronic): Secondary | ICD-10-CM | POA: Diagnosis not present

## 2017-01-10 LAB — HEMOGLOBIN AND HEMATOCRIT, BLOOD
HCT: 27.8 % — ABNORMAL LOW (ref 39.0–52.0)
HEMOGLOBIN: 9.1 g/dL — AB (ref 13.0–17.0)

## 2017-01-10 MED ORDER — FUROSEMIDE 10 MG/ML IJ SOLN
20.0000 mg | Freq: Once | INTRAMUSCULAR | Status: AC
  Administered 2017-01-10: 20 mg via INTRAVENOUS
  Filled 2017-01-10: qty 4

## 2017-01-10 MED ORDER — SODIUM CHLORIDE 0.9 % IV SOLN
Freq: Once | INTRAVENOUS | Status: AC
  Administered 2017-01-10: 250 mL via INTRAVENOUS

## 2017-01-10 NOTE — Progress Notes (Signed)
Results for SEVAG, SHEARN (MRN [id number]) as of 01/10/2017 15:08  Ref. Range 01/10/2017 12:59  Hemoglobin Latest Ref Range: 13.0 - 17.0 g/dL 9.1 (L)  HCT Latest Ref Range: 39.0 - 52.0 % 27.8 (L)   S/p 2 unit pRBC tansfusion

## 2017-01-11 ENCOUNTER — Ambulatory Visit (HOSPITAL_COMMUNITY)
Admission: RE | Admit: 2017-01-11 | Discharge: 2017-01-11 | Disposition: A | Discharge status: Home / Self Care | Payer: Medicare HMO | Source: Ambulatory Visit | Attending: *Deleted | Admitting: *Deleted

## 2017-01-11 ENCOUNTER — Ambulatory Visit (INDEPENDENT_AMBULATORY_CARE_PROVIDER_SITE_OTHER): Payer: Medicare HMO | Admitting: *Deleted

## 2017-01-11 DIAGNOSIS — I700 Atherosclerosis of aorta: Secondary | ICD-10-CM | POA: Diagnosis not present

## 2017-01-11 DIAGNOSIS — I480 Paroxysmal atrial fibrillation: Secondary | ICD-10-CM | POA: Diagnosis not present

## 2017-01-11 DIAGNOSIS — R634 Abnormal weight loss: Secondary | ICD-10-CM | POA: Insufficient documentation

## 2017-01-11 DIAGNOSIS — Z7901 Long term (current) use of anticoagulants: Secondary | ICD-10-CM | POA: Diagnosis not present

## 2017-01-11 DIAGNOSIS — R630 Anorexia: Secondary | ICD-10-CM | POA: Diagnosis not present

## 2017-01-11 DIAGNOSIS — I714 Abdominal aortic aneurysm, without rupture, unspecified: Secondary | ICD-10-CM

## 2017-01-11 DIAGNOSIS — I2699 Other pulmonary embolism without acute cor pulmonale: Secondary | ICD-10-CM | POA: Diagnosis not present

## 2017-01-11 LAB — TYPE AND SCREEN
ABO/RH(D): A NEG
ANTIBODY SCREEN: NEGATIVE
UNIT DIVISION: 0
UNIT DIVISION: 0

## 2017-01-11 LAB — BPAM RBC
BLOOD PRODUCT EXPIRATION DATE: 201803272359
Blood Product Expiration Date: 201803192359
ISSUE DATE / TIME: 201802280749
ISSUE DATE / TIME: 201802280928
UNIT TYPE AND RH: 9500
Unit Type and Rh: 9500

## 2017-01-11 LAB — POCT INR: INR: 2.4

## 2017-01-11 MED ORDER — IOPAMIDOL (ISOVUE-370) INJECTION 76%
100.0000 mL | Freq: Once | INTRAVENOUS | Status: AC | PRN
  Administered 2017-01-11: 100 mL via INTRAVENOUS

## 2017-01-24 ENCOUNTER — Encounter (HOSPITAL_COMMUNITY): Payer: Self-pay

## 2017-01-24 ENCOUNTER — Inpatient Hospital Stay (HOSPITAL_COMMUNITY)
Admission: AD | Admit: 2017-01-24 | Discharge: 2017-01-29 | DRG: 378 | Disposition: A | Discharge status: Home / Self Care | Payer: Medicare HMO | Source: Ambulatory Visit | Attending: Family Medicine | Admitting: Family Medicine

## 2017-01-24 ENCOUNTER — Other Ambulatory Visit (HOSPITAL_COMMUNITY)
Admission: RE | Admit: 2017-01-24 | Discharge: 2017-01-24 | Disposition: A | Discharge status: Home / Self Care | Payer: Medicare HMO | Source: Ambulatory Visit | Attending: *Deleted | Admitting: *Deleted

## 2017-01-24 DIAGNOSIS — G4731 Primary central sleep apnea: Secondary | ICD-10-CM | POA: Diagnosis present

## 2017-01-24 DIAGNOSIS — Z823 Family history of stroke: Secondary | ICD-10-CM

## 2017-01-24 DIAGNOSIS — I252 Old myocardial infarction: Secondary | ICD-10-CM | POA: Diagnosis not present

## 2017-01-24 DIAGNOSIS — Z9981 Dependence on supplemental oxygen: Secondary | ICD-10-CM

## 2017-01-24 DIAGNOSIS — I5042 Chronic combined systolic (congestive) and diastolic (congestive) heart failure: Secondary | ICD-10-CM | POA: Diagnosis present

## 2017-01-24 DIAGNOSIS — K921 Melena: Secondary | ICD-10-CM | POA: Diagnosis not present

## 2017-01-24 DIAGNOSIS — Z7901 Long term (current) use of anticoagulants: Secondary | ICD-10-CM

## 2017-01-24 DIAGNOSIS — E782 Mixed hyperlipidemia: Secondary | ICD-10-CM | POA: Diagnosis present

## 2017-01-24 DIAGNOSIS — K5520 Angiodysplasia of colon without hemorrhage: Secondary | ICD-10-CM | POA: Diagnosis present

## 2017-01-24 DIAGNOSIS — D649 Anemia, unspecified: Secondary | ICD-10-CM | POA: Diagnosis not present

## 2017-01-24 DIAGNOSIS — D62 Acute posthemorrhagic anemia: Secondary | ICD-10-CM | POA: Diagnosis present

## 2017-01-24 DIAGNOSIS — Z8673 Personal history of transient ischemic attack (TIA), and cerebral infarction without residual deficits: Secondary | ICD-10-CM

## 2017-01-24 DIAGNOSIS — Z885 Allergy status to narcotic agent status: Secondary | ICD-10-CM

## 2017-01-24 DIAGNOSIS — I480 Paroxysmal atrial fibrillation: Secondary | ICD-10-CM | POA: Diagnosis not present

## 2017-01-24 DIAGNOSIS — J449 Chronic obstructive pulmonary disease, unspecified: Secondary | ICD-10-CM | POA: Diagnosis present

## 2017-01-24 DIAGNOSIS — E119 Type 2 diabetes mellitus without complications: Secondary | ICD-10-CM | POA: Diagnosis present

## 2017-01-24 DIAGNOSIS — K5730 Diverticulosis of large intestine without perforation or abscess without bleeding: Secondary | ICD-10-CM | POA: Diagnosis not present

## 2017-01-24 DIAGNOSIS — I110 Hypertensive heart disease with heart failure: Secondary | ICD-10-CM | POA: Diagnosis present

## 2017-01-24 DIAGNOSIS — D6832 Hemorrhagic disorder due to extrinsic circulating anticoagulants: Secondary | ICD-10-CM | POA: Diagnosis present

## 2017-01-24 DIAGNOSIS — G4733 Obstructive sleep apnea (adult) (pediatric): Secondary | ICD-10-CM | POA: Diagnosis present

## 2017-01-24 DIAGNOSIS — Z87891 Personal history of nicotine dependence: Secondary | ICD-10-CM | POA: Diagnosis not present

## 2017-01-24 DIAGNOSIS — Z888 Allergy status to other drugs, medicaments and biological substances status: Secondary | ICD-10-CM

## 2017-01-24 DIAGNOSIS — Z880 Allergy status to penicillin: Secondary | ICD-10-CM

## 2017-01-24 DIAGNOSIS — Z7982 Long term (current) use of aspirin: Secondary | ICD-10-CM

## 2017-01-24 DIAGNOSIS — Z86711 Personal history of pulmonary embolism: Secondary | ICD-10-CM

## 2017-01-24 DIAGNOSIS — M1990 Unspecified osteoarthritis, unspecified site: Secondary | ICD-10-CM | POA: Diagnosis present

## 2017-01-24 DIAGNOSIS — Z794 Long term (current) use of insulin: Secondary | ICD-10-CM

## 2017-01-24 DIAGNOSIS — K31819 Angiodysplasia of stomach and duodenum without bleeding: Secondary | ICD-10-CM | POA: Diagnosis not present

## 2017-01-24 DIAGNOSIS — I714 Abdominal aortic aneurysm, without rupture, unspecified: Secondary | ICD-10-CM | POA: Diagnosis present

## 2017-01-24 DIAGNOSIS — K922 Gastrointestinal hemorrhage, unspecified: Secondary | ICD-10-CM | POA: Diagnosis present

## 2017-01-24 DIAGNOSIS — Z820 Family history of epilepsy and other diseases of the nervous system: Secondary | ICD-10-CM

## 2017-01-24 DIAGNOSIS — K5731 Diverticulosis of large intestine without perforation or abscess with bleeding: Principal | ICD-10-CM | POA: Diagnosis present

## 2017-01-24 DIAGNOSIS — K5791 Diverticulosis of intestine, part unspecified, without perforation or abscess with bleeding: Secondary | ICD-10-CM | POA: Diagnosis not present

## 2017-01-24 DIAGNOSIS — Z96653 Presence of artificial knee joint, bilateral: Secondary | ICD-10-CM | POA: Diagnosis present

## 2017-01-24 DIAGNOSIS — Z7902 Long term (current) use of antithrombotics/antiplatelets: Secondary | ICD-10-CM

## 2017-01-24 DIAGNOSIS — Z8249 Family history of ischemic heart disease and other diseases of the circulatory system: Secondary | ICD-10-CM

## 2017-01-24 HISTORY — DX: Transient cerebral ischemic attack, unspecified: G45.9

## 2017-01-24 LAB — CBC WITH DIFFERENTIAL/PLATELET
BASOS ABS: 0.1 10*3/uL (ref 0.0–0.1)
Basophils Relative: 1 %
EOS PCT: 5 %
Eosinophils Absolute: 0.6 10*3/uL (ref 0.0–0.7)
HCT: 21.5 % — ABNORMAL LOW (ref 39.0–52.0)
HEMOGLOBIN: 6.6 g/dL — AB (ref 13.0–17.0)
LYMPHS ABS: 1.7 10*3/uL (ref 0.7–4.0)
LYMPHS PCT: 14 %
MCH: 27.8 pg (ref 26.0–34.0)
MCHC: 30.7 g/dL (ref 30.0–36.0)
MCV: 90.7 fL (ref 78.0–100.0)
Monocytes Absolute: 0.6 10*3/uL (ref 0.1–1.0)
Monocytes Relative: 5 %
NEUTROS ABS: 8.8 10*3/uL — AB (ref 1.7–7.7)
NEUTROS PCT: 75 %
Platelets: 541 10*3/uL — ABNORMAL HIGH (ref 150–400)
RBC: 2.37 MIL/uL — AB (ref 4.22–5.81)
RDW: 15.1 % (ref 11.5–15.5)
WBC: 11.8 10*3/uL — AB (ref 4.0–10.5)

## 2017-01-24 LAB — BASIC METABOLIC PANEL
Anion gap: 8 (ref 5–15)
BUN: 22 mg/dL — AB (ref 6–20)
CO2: 30 mmol/L (ref 22–32)
CREATININE: 1.47 mg/dL — AB (ref 0.61–1.24)
Calcium: 8.7 mg/dL — ABNORMAL LOW (ref 8.9–10.3)
Chloride: 99 mmol/L — ABNORMAL LOW (ref 101–111)
GFR calc Af Amer: 53 mL/min — ABNORMAL LOW (ref >60)
GFR calc non Af Amer: 46 mL/min — ABNORMAL LOW (ref >60)
Glucose, Bld: 70 mg/dL (ref 65–99)
Potassium: 2.9 mmol/L — ABNORMAL LOW (ref 3.5–5.1)
Sodium: 137 mmol/L (ref 135–145)

## 2017-01-24 LAB — GLUCOSE, CAPILLARY: Glucose-Capillary: 72 mg/dL (ref 65–99)

## 2017-01-24 LAB — PREPARE RBC (CROSSMATCH)

## 2017-01-24 LAB — PROTIME-INR
INR: 2.87
Prothrombin Time: 30.6 seconds — ABNORMAL HIGH (ref 11.4–15.2)

## 2017-01-24 MED ORDER — LACTATED RINGERS IV SOLN
INTRAVENOUS | Status: DC
  Administered 2017-01-25 – 2017-01-28 (×4): via INTRAVENOUS

## 2017-01-24 MED ORDER — ASPIRIN EC 81 MG PO TBEC
81.0000 mg | DELAYED_RELEASE_TABLET | Freq: Every day | ORAL | Status: DC
  Administered 2017-01-24 – 2017-01-25 (×2): 81 mg via ORAL
  Filled 2017-01-24 (×2): qty 1

## 2017-01-24 MED ORDER — INSULIN ASPART 100 UNIT/ML ~~LOC~~ SOLN
0.0000 [IU] | Freq: Three times a day (TID) | SUBCUTANEOUS | Status: DC
  Administered 2017-01-25 – 2017-01-26 (×3): 3 [IU] via SUBCUTANEOUS
  Administered 2017-01-26: 2 [IU] via SUBCUTANEOUS
  Administered 2017-01-27: 3 [IU] via SUBCUTANEOUS
  Administered 2017-01-27 – 2017-01-28 (×2): 2 [IU] via SUBCUTANEOUS
  Administered 2017-01-28: 3 [IU] via SUBCUTANEOUS

## 2017-01-24 MED ORDER — PANTOPRAZOLE SODIUM 40 MG IV SOLR
40.0000 mg | Freq: Two times a day (BID) | INTRAVENOUS | Status: DC
  Administered 2017-01-24 – 2017-01-28 (×8): 40 mg via INTRAVENOUS
  Filled 2017-01-24 (×8): qty 40

## 2017-01-24 MED ORDER — SODIUM CHLORIDE 0.9 % IV SOLN
Freq: Once | INTRAVENOUS | Status: AC
  Administered 2017-01-25: 01:00:00 via INTRAVENOUS

## 2017-01-24 MED ORDER — ACETAMINOPHEN 325 MG PO TABS: 650.0000 mg | ORAL_TABLET | Freq: Four times a day (QID) | ORAL | Status: DC | PRN

## 2017-01-24 MED ORDER — METOPROLOL TARTRATE 25 MG PO TABS
25.0000 mg | ORAL_TABLET | Freq: Two times a day (BID) | ORAL | Status: DC
  Administered 2017-01-24 – 2017-01-29 (×10): 25 mg via ORAL
  Filled 2017-01-24 (×10): qty 1

## 2017-01-24 MED ORDER — ACETAMINOPHEN 650 MG RE SUPP: 650.0000 mg | Freq: Four times a day (QID) | RECTAL | Status: DC | PRN

## 2017-01-24 MED ORDER — ONDANSETRON HCL 4 MG PO TABS: 4.0000 mg | ORAL_TABLET | Freq: Four times a day (QID) | ORAL | Status: DC | PRN

## 2017-01-24 MED ORDER — FUROSEMIDE 10 MG/ML IJ SOLN
20.0000 mg | Freq: Once | INTRAMUSCULAR | Status: AC
  Administered 2017-01-24: 20 mg via INTRAVENOUS
  Filled 2017-01-24: qty 2

## 2017-01-24 MED ORDER — ONDANSETRON HCL 4 MG/2ML IJ SOLN: 4.0000 mg | Freq: Four times a day (QID) | INTRAMUSCULAR | Status: DC | PRN

## 2017-01-24 MED ORDER — DONEPEZIL HCL 5 MG PO TABS
10.0000 mg | ORAL_TABLET | Freq: Every day | ORAL | Status: DC
  Administered 2017-01-24 – 2017-01-28 (×5): 10 mg via ORAL
  Filled 2017-01-24 (×5): qty 2

## 2017-01-24 MED ORDER — AMLODIPINE BESYLATE 5 MG PO TABS
5.0000 mg | ORAL_TABLET | Freq: Every day | ORAL | Status: DC
  Administered 2017-01-24 – 2017-01-29 (×6): 5 mg via ORAL
  Filled 2017-01-24 (×6): qty 1

## 2017-01-24 NOTE — H&P (Signed)
History and Physical    Rodney Orozco GNF:[id number] DOB: 12-Oct-1941 DOA: 01/24/2017  PCP: [name], DO Consultants:  Scot Dock - vascular; Gwenlyn Found - cardiology; Irish Lack - interventional cardiology, stent 12/05/16; Jaynee Eagles - neurology Patient coming from: home - lives with wife; NOK: wife, [phone], [phone]  Chief Complaint: GI bleed  HPI: Rodney Orozco is a 76 y.o. male with medical history significant of AAA s/p repair in 10/17, h/o surgery for PUD, COPD, combined heart failure (EF 72%, grade 1 diastolic dysfunction 5/36/64), PAF, post-op PE in 2016, CVA on Warfarin, NSTEMI with DES x 2 on 12/06/16.  He reports having been hospitalizedabout 2 weeks ago for GI bleeding; he was transfused 2 units PRBC and had a negative CT (no further evaluation - this note is not available).  Today, Dr. Melina Copa sent him as a direct admission - he had blood work this AM and his Hgb was 6 again.    No abdominal pain or chest pain.  Takes iron, thought maybe this was turning stools dark, has persisted since last hospitalization.  No n/v.  +SOB with ambulation, improves with rest.  Denies fatigue - famioly disagrees.  Has been in/out of hospital since last August. A bit lightheaded when he stands up.     Review of Systems: As per HPI; otherwise 10 point review of systems reviewed and negative.   Ambulatory Status: ambulated without assistance prior  Past Medical History:  Diagnosis Date  . AAA (abdominal aortic aneurysm) ([place]) 2017  . Asthma   . Cardiomyopathy ([place])   . Dementia   . Dyslipidemia   . HCAP (healthcare-associated pneumonia) 12/2013   01/06/2014  . Hypertension   . Mixed hyperlipidemia   . On home oxygen therapy    2L during the day  . OSA on CPAP    actually BIPAP  . Osteoarthritis   . Paroxysmal atrial fibrillation (HCC)    takes Warfarin  . Pneumonia December 2014   Had hemoptysis and admitted at [facility].  . Pulmonary embolism ([place]) 12/2014   S/P knee  replacement/notes 08/31/2016  . TIA (transient ischemic attack)   . Type II diabetes mellitus ([place])   . Vitamin D deficiency     Past Surgical History:  Procedure Laterality Date  . ABDOMINAL AORTIC ANEURYSM REPAIR  08/31/2016  . ABDOMINAL AORTIC ENDOVASCULAR STENT GRAFT N/A 08/31/2016   Procedure: ABDOMINAL AORTIC ENDOVASCULAR STENT GRAFT and Open Exposure left common femoral artery;  Surgeon: Angelia Mould, MD;  Location: [facility] OR;  Service: Vascular;  Laterality: N/A;  . ABDOMINAL SURGERY  1999   "for aneurysm"  . BACK SURGERY    . CARDIAC CATHETERIZATION N/A 12/05/2016   Procedure: Right/Left Heart Cath and Coronary Angiography;  Surgeon: Jettie Booze, MD;  Location: [place] CV LAB;  Service: Cardiovascular;  Laterality: N/A;  . CARDIAC CATHETERIZATION N/A 12/05/2016   Procedure: Coronary Stent Intervention;  Surgeon: Jettie Booze, MD;  Location: [place] CV LAB;  Service: Cardiovascular;  Laterality: N/A;  . JOINT REPLACEMENT    . Cataract  . TOTAL KNEE ARTHROPLASTY Bilateral Feb 2016 - Dec 2016    Social History   Social History  . Marital status: Married    Spouse name: [name]  . Number of children: 3  . Years of education: 43   Occupational History  . Retired    Social History Main Topics  . Smoking status: Former Smoker    Packs/day: 2.00    Years: 50.00  Types: Cigarettes    Quit date: 11/07/2013  . Smokeless tobacco: Never Used  . Alcohol use No  . Drug use: No  . Sexual activity: Not on file   Other Topics Concern  . Not on file   Social History Narrative   Lives with wife   Caffeine use: 4 cups /day (coffee)    Allergies  Allergen Reactions  . Codeine Other (See Comments)    Hallucinations  . Tramadol Dermatitis  . Lipitor [Atorvastatin] Other (See Comments)    Joint pain  . Penicillins Rash    HIves/rash Has patient had a PCN reaction causing immediate rash, facial/tongue/throat swelling, SOB or  lightheadedness with hypotension: Yes Has patient had a PCN reaction causing severe rash involving mucus membranes or skin necrosis: No Has patient had a PCN reaction that required hospitalization No Has patient had a PCN reaction occurring within the last 10 years: No If all of the above answers are "NO", then may proceed with Cephalosporin use.     Family History  Problem Relation Age of Onset  . Dementia Mother   . Stroke Mother   . Heart attack Father     Prior to Admission medications   Medication Sig Start Date End Date Taking? Authorizing Provider  amLODipine (NORVASC) 5 MG tablet Take 1 tablet (5 mg total) by mouth daily. 12/07/16   Arbutus Leas, NP  aspirin EC 81 MG tablet Take 81 mg by mouth daily.    Historical Provider, MD  clopidogrel (PLAVIX) 75 MG tablet Take 1 tablet (75 mg total) by mouth daily. 12/06/16   Arbutus Leas, NP  donepezil (ARICEPT) 10 MG tablet Take 1 tablet (10 mg total) by mouth at bedtime. 04/25/16   Melvenia Beam, MD  famotidine (PEPCID) 20 MG tablet Take 1 tablet by mouth 2 (two) times daily. 06/30/16   Historical Provider, MD  furosemide (LASIX) 40 MG tablet Take 1 tablet by mouth 2 (two) times daily. 06/30/16   Historical Provider, MD  losartan (COZAAR) 100 MG tablet Take 100 mg by mouth daily. 02/25/16   Historical Provider, MD  metFORMIN (GLUCOPHAGE) 500 MG tablet Take 1,000 mg by mouth 2 (two) times daily. 10/04/16   Historical Provider, MD  metoprolol tartrate (LOPRESSOR) 25 MG tablet Take 1 tablet by mouth 2 (two) times daily. 06/30/16   Historical Provider, MD  Misc. Devices MISC Diclofenac 3% Baclofen 2% lidocaine 5% menthol 1%,  Apply 1-2 gm 3-4 times daily to right knee as needed for pain 02/09/15   Historical Provider, MD  nitroGLYCERIN (NITROSTAT) 0.4 MG SL tablet Place 1 tablet (0.4 mg total) under the tongue every 5 (five) minutes as needed for chest pain. MAX 3 dose 12/08/16   Lorretta Harp, MD  NOVOLIN N RELION 100 UNIT/ML injection Inject  10 Units into the skin 2 (two) times daily.  04/13/16   Historical Provider, MD  OXYGEN Inhale 2 L into the lungs at bedtime. 2lpm 24/7  DME- [facility]     Historical Provider, MD  VICTOZA 18 MG/3ML SOPN Inject 1.2 mg into the skin every morning.  11/15/16   Historical Provider, MD  warfarin (COUMADIN) 5 MG tablet Take 2 tablets daily except 1 1/2 tablets on Mondays, Wednesdays and Fridays 01/02/17   Lorretta Harp, MD    Physical Exam: Vitals:   01/24/17 1751 01/24/17 1811 01/24/17 2100  BP: (!) 149/62  137/63  Pulse: 90  85  Resp: 18  16  Temp: 98.8 F (37.1  C)    TempSrc: Oral    SpO2: 99%    Weight:  101.8 kg (224 lb 8 oz)   Height: 5' 10.5" (1.791 m)       General: Appears calm and comfortable and is NAD Eyes:  PERRL, EOMI, normal lids, iris ENT:  grossly normal hearing, lips & tongue, mmm Neck:  no LAD, masses or thyromegaly Cardiovascular:  RRR, no m/r/g. No LE edema.  Respiratory:  CTA bilaterally, no w/r/r. Normal respiratory effort. Abdomen:  soft, ntnd, NABS Skin:  no rash or induration seen on limited exam Musculoskeletal:  grossly normal tone BUE/BLE, good ROM, no bony abnormality Psychiatric:  grossly normal mood and affect, speech fluent and appropriate, AOx3 Neurologic:  CN 2-12 grossly intact, moves all extremities in coordinated fashion, sensation intact  Labs on Admission: I have personally reviewed following labs and imaging studies  CBC:  Recent Labs Lab 01/24/17 1249  WBC 11.8*  NEUTROABS 8.8*  HGB 6.6*  HCT 21.5*  MCV 90.7  PLT 161*   Basic Metabolic Panel: No results for input(s): NA, K, CL, CO2, GLUCOSE, BUN, CREATININE, CALCIUM, MG, PHOS in the last 168 hours. GFR: CrCl cannot be calculated (Patient's most recent lab result is older than the maximum 21 days allowed.). Liver Function Tests: No results for input(s): AST, ALT, ALKPHOS, BILITOT, PROT, ALBUMIN in the last 168 hours. No results for input(s): LIPASE, AMYLASE in the last 168 hours. No  results for input(s): AMMONIA in the last 168 hours. Coagulation Profile: No results for input(s): INR, PROTIME in the last 168 hours. Cardiac Enzymes: No results for input(s): CKTOTAL, CKMB, CKMBINDEX, TROPONINI in the last 168 hours. BNP (last 3 results) No results for input(s): PROBNP in the last 8760 hours. HbA1C: No results for input(s): HGBA1C in the last 72 hours. CBG:  Recent Labs Lab 01/24/17 2120  GLUCAP 72   Lipid Profile: No results for input(s): CHOL, HDL, LDLCALC, TRIG, CHOLHDL, LDLDIRECT in the last 72 hours. Thyroid Function Tests: No results for input(s): TSH, T4TOTAL, FREET4, T3FREE, THYROIDAB in the last 72 hours. Anemia Panel: No results for input(s): VITAMINB12, FOLATE, FERRITIN, TIBC, IRON, RETICCTPCT in the last 72 hours. Urine analysis:    Component Value Date/Time   COLORURINE YELLOW 08/29/2016 [place] 08/29/2016 1333   LABSPEC 1.006 08/29/2016 1333   PHURINE 6.5 08/29/2016 1333   GLUCOSEU NEGATIVE 08/29/2016 1333   HGBUR NEGATIVE 08/29/2016 [place] 08/29/2016 1333   KETONESUR NEGATIVE 08/29/2016 1333   PROTEINUR NEGATIVE 08/29/2016 1333   NITRITE NEGATIVE 08/29/2016 1333   LEUKOCYTESUR NEGATIVE 08/29/2016 1333    Creatinine Clearance: CrCl cannot be calculated (Patient's most recent lab result is older than the maximum 21 days allowed.).  Sepsis Labs: '@LABRCNTIP'$ (procalcitonin:4,lacticidven:4) )No results found for this or any previous visit (from the past 240 hour(s)).   Radiological Exams on Admission: No results found.  EKG: not done  Assessment/Plan Principal Problem:   GI bleed Active Problems:   Diabetes mellitus without complication (HCC)   Paroxysmal atrial fibrillation (HCC)   Central sleep apnea   Long-term (current) use of anticoagulants   AAA (abdominal aortic aneurysm) ([place]) s/p repair in oct 2017 by dr Scot Dock   Chronic combined systolic and diastolic CHF (congestive heart failure)  ([place])   GI Bleed -Patient's has very mild symptoms of lightheadedness and fatigue - most likely caused by anemia secondary to upper GI bleeding.  -He has a h/o PUD requiring gastric surgery in the past. -His  care in complicated by ongoing use of anticoagulants (see below). - will admit to med surg bed - GI consult in AM - NPO for possible EGD - NS at 50 mL/hr starting at midnight - Start IV pantoprazole 40 mg bid - Zofran IV for nausea - Avoid NSAIDs and SQ heparin - Maintain IV access (2 large bore IVs if possible). - Monitor closely and recheck labs in AM -Remote h/o EGD/colonoscopy -Will not prep tonight, since it is reasonable to consider evaluation of the upper GI tract first. -If EGD/colonoscopy do not reveal a source, additional considerations include capsule endoscopy and tagged RBC scan. -Hgb 6.6, prior 9.1 on 2/28 -Will transfuse 2 units PRBC and recheck CBC following transfusion.  Antiplatelet use in the setting of recent drug-eluting stent  -He is approximately 1-1/2 months out side of stent placement. Still an high risk for stent thrombosis.  -Taking ASA and Plavix (as well as Coumadin, see below).  -If active bleeding continues, will need to stop ASA 81 as well. -While there is the risk of stent thrombosis, he still cannot use dual antiplatelet therapy in the setting of active bleeding   Afib, on Coumadin -Unfortunately, the patent took his Coumadin tonight before coming to the ER. -Will check INR.   -Consider reversal, but he is not actively bleeding at this time. -He has a seriously elevated stroke risk, CHA2DS2-VASc score is 7, with 9.6% stroke rate/year. -Would suggest letting the INR drift down if possible rather than reversing, because then it will take longer to reach target INR of 2-3.  DM -Hold Glucophage -Cover with SSI  OSA -Wears home O2 for COPD during the day, BIPAP at night -Will continue  CHF -Will give Lasix between units    DVT  prophylaxis:  SCDs Code Status: Full - confirmed with patient/family Family Communication: Wife and daughter present throughout evaluation Disposition Plan:  Home once clinically improved Consults called: GI   Admission status: Admit - It is my clinical opinion that admission to INPATIENT is reasonable and necessary because this patient will require at least 2 midnights in the hospital to treat this condition based on the medical complexity of the problems presented.  Given the aforementioned information, the predictability of an adverse outcome is felt to be significant.     Karmen Bongo MD [facility]  If 7PM-7AM, please contact night-coverage [email or web address] Password [facility]  01/24/2017, 10:51 PM

## 2017-01-25 ENCOUNTER — Telehealth: Payer: Self-pay | Admitting: Gastroenterology

## 2017-01-25 ENCOUNTER — Encounter (HOSPITAL_COMMUNITY): Payer: Self-pay | Admitting: Gastroenterology

## 2017-01-25 DIAGNOSIS — K921 Melena: Secondary | ICD-10-CM

## 2017-01-25 DIAGNOSIS — D649 Anemia, unspecified: Secondary | ICD-10-CM

## 2017-01-25 LAB — OCCULT BLOOD X 1 CARD TO LAB, STOOL: FECAL OCCULT BLD: POSITIVE — AB

## 2017-01-25 LAB — GLUCOSE, CAPILLARY
GLUCOSE-CAPILLARY: 109 mg/dL — AB (ref 65–99)
Glucose-Capillary: 104 mg/dL — ABNORMAL HIGH (ref 65–99)
Glucose-Capillary: 112 mg/dL — ABNORMAL HIGH (ref 65–99)
Glucose-Capillary: 135 mg/dL — ABNORMAL HIGH (ref 65–99)
Glucose-Capillary: 168 mg/dL — ABNORMAL HIGH (ref 65–99)

## 2017-01-25 LAB — BASIC METABOLIC PANEL
Anion gap: 8 (ref 5–15)
BUN: 20 mg/dL (ref 6–20)
CALCIUM: 8.5 mg/dL — AB (ref 8.9–10.3)
CO2: 29 mmol/L (ref 22–32)
CREATININE: 1.46 mg/dL — AB (ref 0.61–1.24)
Chloride: 101 mmol/L (ref 101–111)
GFR calc non Af Amer: 46 mL/min — ABNORMAL LOW (ref >60)
GFR, EST AFRICAN AMERICAN: 53 mL/min — AB (ref >60)
Glucose, Bld: 122 mg/dL — ABNORMAL HIGH (ref 65–99)
Potassium: 2.9 mmol/L — ABNORMAL LOW (ref 3.5–5.1)
SODIUM: 138 mmol/L (ref 135–145)

## 2017-01-25 LAB — CBC
HCT: 26 % — ABNORMAL LOW (ref 39.0–52.0)
Hemoglobin: 8.4 g/dL — ABNORMAL LOW (ref 13.0–17.0)
MCH: 28.6 pg (ref 26.0–34.0)
MCHC: 32.3 g/dL (ref 30.0–36.0)
MCV: 88.4 fL (ref 78.0–100.0)
PLATELETS: 506 10*3/uL — AB (ref 150–400)
RBC: 2.94 MIL/uL — AB (ref 4.22–5.81)
RDW: 15 % (ref 11.5–15.5)
WBC: 10.7 10*3/uL — AB (ref 4.0–10.5)

## 2017-01-25 LAB — PROTIME-INR
INR: 2.56
PROTHROMBIN TIME: 28 s — AB (ref 11.4–15.2)

## 2017-01-25 MED ORDER — POTASSIUM CHLORIDE CRYS ER 20 MEQ PO TBCR
20.0000 meq | EXTENDED_RELEASE_TABLET | Freq: Two times a day (BID) | ORAL | Status: AC
  Administered 2017-01-25 – 2017-01-26 (×4): 20 meq via ORAL
  Filled 2017-01-25 (×4): qty 1

## 2017-01-25 MED ORDER — CLOPIDOGREL BISULFATE 75 MG PO TABS
75.0000 mg | ORAL_TABLET | Freq: Every day | ORAL | Status: DC
  Administered 2017-01-26: 75 mg via ORAL
  Filled 2017-01-25: qty 1

## 2017-01-25 NOTE — Telephone Encounter (Signed)
Patient desires Dr. Laural Golden as primary GI as Dr. Laural Golden takes care of his wife. I spoke to Dr. Laural Golden who recommends [facility] continue inpatient care but patient can see him starting at time of discharge.

## 2017-01-25 NOTE — Care Management Note (Signed)
Case Management Note  Patient Details  Name: Rodney Orozco MRN: [id number] Date of Birth: Sep 10, 1941  Subjective/Objective:                  Pt admitted with GIB. pta from home with wife. Pt on cont supplemental oxygen through [place] and has bipap. Pt has PCP, transportation to appointments and no difficulty affording or managing medications. No HH or DME needs.   Action/Plan: Plan to return home with self care.   Expected Discharge Date:  01/27/17               Expected Discharge Plan:  Home/Self Care  In-House Referral:  NA  Discharge planning Services  CM Consult  Post Acute Care Choice:  NA Choice offered to:  NA  Status of Service:  Completed, signed off Sherald Barge, RN 01/25/2017, 3:57 PM

## 2017-01-25 NOTE — Progress Notes (Signed)
Patient refused BIPAP use for tonight. RT told patient to call if he changed his mind.

## 2017-01-25 NOTE — Consult Note (Signed)
Referring Provider: Murlean Iba, MD Primary Care Physician:  [name], DO Primary Gastroenterologist:  Althia Forts, wants Dr. Laural Golden (he takes care of his wife)  Reason for Consultation:  GI bleed  HPI: Rodney Orozco is a 76 y.o. male with AAA s/p repair 08/2016, NSTEMI with DES X 2 on 12/06/16, combined heart failure, PAF, post-op PE 2016, CVA, remote bleeding PUD requiring surgery who presented as direct admission for Hgb 6.   Patient had normal H/H 12/06/16 (13.5/40.2). 01/08/17 H/H down to 7.5/23.2. He received 2 units of prbcs and H/H up to 9.1/27.8 on 01/10/17. Presented yesterday with H/H of 6.6/21.5. INR 2.87 last night.   Complains of "dark stools" on iron. No brbpr. No abdominal pain. No ugi symptoms. Appetite is good.   Recent cardiac cath with two DES on 12/05/16. Discharged on ASA/Plavix/Coumadin. Patient stopped ASA 01/05/17 per cardiology protocol.   CTA A/P 01/12/17 showed no evidence of endoleak from endovascular repair of infrarenal abdominal aortic aneurysm.   Prior to Admission medications   Medication Sig Start Date End Date Taking? Authorizing Provider  amLODipine (NORVASC) 5 MG tablet Take 1 tablet (5 mg total) by mouth daily. 12/07/16  Yes Arbutus Leas, NP  clopidogrel (PLAVIX) 75 MG tablet Take 1 tablet (75 mg total) by mouth daily. 12/06/16  Yes Arbutus Leas, NP  donepezil (ARICEPT) 10 MG tablet Take 1 tablet (10 mg total) by mouth at bedtime. 04/25/16  Yes Melvenia Beam, MD  famotidine (PEPCID) 20 MG tablet Take 1 tablet by mouth 2 (two) times daily. 06/30/16  Yes Historical Provider, MD  furosemide (LASIX) 40 MG tablet Take 1 tablet by mouth 2 (two) times daily. 06/30/16  Yes Historical Provider, MD  losartan (COZAAR) 100 MG tablet Take 100 mg by mouth daily. 02/25/16  Yes Historical Provider, MD  metFORMIN (GLUCOPHAGE) 500 MG tablet Take 1,000 mg by mouth 2 (two) times daily. 10/04/16  Yes Historical Provider, MD  metoprolol tartrate (LOPRESSOR) 25 MG tablet  Take 1 tablet by mouth 2 (two) times daily. 06/30/16  Yes Historical Provider, MD  nitroGLYCERIN (NITROSTAT) 0.4 MG SL tablet Place 1 tablet (0.4 mg total) under the tongue every 5 (five) minutes as needed for chest pain. MAX 3 dose 12/08/16  Yes Lorretta Harp, MD  NOVOLIN N RELION 100 UNIT/ML injection Inject 10 Units into the skin 2 (two) times daily.  04/13/16  Yes Historical Provider, MD  OXYGEN Inhale 2 L into the lungs at bedtime. 2lpm 24/7  DME- [employer]    Yes Historical Provider, MD  VICTOZA 18 MG/3ML SOPN Inject 1.2 mg into the skin every morning.  11/15/16  Yes Historical Provider, MD  warfarin (COUMADIN) 5 MG tablet Take 2 tablets daily except 1 1/2 tablets on Mondays, Wednesdays and Fridays Patient taking differently: Take 7.5-10 mg by mouth See admin instructions. Take 2 tablets daily except 1 1/2 tablets on Mondays, Wednesdays and Fridays 01/02/17  Yes Lorretta Harp, MD    Current Facility-Administered Medications  Medication Dose Route Frequency Provider Last Rate Last Dose  . acetaminophen (TYLENOL) tablet 650 mg  650 mg Oral Q6H PRN Karmen Bongo, MD       Or  . acetaminophen (TYLENOL) suppository 650 mg  650 mg Rectal Q6H PRN Karmen Bongo, MD      . amLODipine (NORVASC) tablet 5 mg  5 mg Oral Daily Karmen Bongo, MD   5 mg at 01/24/17 2113  . aspirin EC tablet 81 mg  81 mg Oral  Daily Karmen Bongo, MD   81 mg at 01/24/17 2112  . donepezil (ARICEPT) tablet 10 mg  10 mg Oral QHS Karmen Bongo, MD   10 mg at 01/24/17 2114  . insulin aspart (novoLOG) injection 0-15 Units  0-15 Units Subcutaneous TID WC Karmen Bongo, MD      . lactated ringers infusion   Intravenous Continuous Karmen Bongo, MD 50 mL/hr at 01/25/17 [phone]    . metoprolol tartrate (LOPRESSOR) tablet 25 mg  25 mg Oral BID Karmen Bongo, MD   25 mg at 01/24/17 2113  . ondansetron (ZOFRAN) tablet 4 mg  4 mg Oral Q6H PRN Karmen Bongo, MD       Or  . ondansetron [facility]) injection 4 mg  4 mg Intravenous Q6H PRN  Karmen Bongo, MD      . pantoprazole (PROTONIX) injection 40 mg  40 mg Intravenous Q12H Karmen Bongo, MD   40 mg at 01/24/17 2112    Allergies as of 01/24/2017 - Review Complete 01/24/2017  Allergen Reaction Noted  . Codeine Other (See Comments) 04/25/2016  . Tramadol Dermatitis 04/25/2016  . Lipitor [atorvastatin] Other (See Comments) 01/06/2014  . Penicillins Rash 01/06/2014    Past Medical History:  Diagnosis Date  . AAA (abdominal aortic aneurysm) ([place]) 2017  . Asthma   . Cardiomyopathy ([place])   . Dementia   . Dyslipidemia   . HCAP (healthcare-associated pneumonia) 12/2013   01/06/2014  . Hypertension   . Mixed hyperlipidemia   . On home oxygen therapy    2L during the day  . OSA on CPAP    actually BIPAP  . Osteoarthritis   . Paroxysmal atrial fibrillation (HCC)    takes Warfarin  . Pneumonia December 2014   Had hemoptysis and admitted at [facility].  . Pulmonary embolism ([place]) 12/2014   S/P knee replacement/notes 08/31/2016  . TIA (transient ischemic attack)   . Type II diabetes mellitus ([place])   . Vitamin D deficiency     Past Surgical History:  Procedure Laterality Date  . ABDOMINAL AORTIC ANEURYSM REPAIR  08/31/2016  . ABDOMINAL AORTIC ENDOVASCULAR STENT GRAFT N/A 08/31/2016   Procedure: ABDOMINAL AORTIC ENDOVASCULAR STENT GRAFT and Open Exposure left common femoral artery;  Surgeon: Angelia Mould, MD;  Location: [facility] OR;  Service: Vascular;  Laterality: N/A;  . ABDOMINAL SURGERY  1999   "for aneurysm"  . ABDOMINAL SURGERY     bleeding PUD in setting of aspirin powders. REMOTE. also H.pylori positive per epic notes  . BACK SURGERY    . CARDIAC CATHETERIZATION N/A 12/05/2016   Procedure: Right/Left Heart Cath and Coronary Angiography;  Surgeon: Jettie Booze, MD;  Location: [place] CV LAB;  Service: Cardiovascular;  Laterality: N/A;  . CARDIAC CATHETERIZATION N/A 12/05/2016   Procedure: Coronary Stent Intervention;  Surgeon: Jettie Booze, MD;  Location: [place] CV LAB;  Service: Cardiovascular;  Laterality: N/A;  . COLONOSCOPY WITH ESOPHAGOGASTRODUODENOSCOPY (EGD)  2004   Dr. Amedeo Plenty: antral gastritis, hypersplastic rectal polyp, few scattered diverticula  . JOINT REPLACEMENT    . [place]  . TOTAL KNEE ARTHROPLASTY Bilateral Feb 2016 - Dec 2016    Family History  Problem Relation Age of Onset  . Dementia Mother   . Stroke Mother   . Heart attack Father     Social History   Social History  . Marital status: Married    Spouse name: [name]  . Number of children: 3  . Years of  education: 11   Occupational History  . Retired    Social History Main Topics  . Smoking status: Former Smoker    Packs/day: 2.00    Years: 50.00    Types: Cigarettes    Quit date: 11/07/2013  . Smokeless tobacco: Never Used  . Alcohol use No  . Drug use: No  . Sexual activity: Not on file   Other Topics Concern  . Not on file   Social History Narrative   Lives with wife   Caffeine use: 4 cups /day (coffee)     ROS:  General: Negative for anorexia, weight loss, fever, chills, fatigue, weakness. Eyes: Negative for vision changes.  ENT: Negative for hoarseness, difficulty swallowing , nasal congestion. CV: Negative for chest pain, angina, palpitations, dyspnea on exertion, peripheral edema.  Respiratory: Negative for dyspnea at rest, dyspnea on exertion, cough, sputum, wheezing.  GI: See history of present illness. GU:  Negative for dysuria, hematuria, urinary incontinence, urinary frequency, nocturnal urination.  MS: Negative for joint pain, low back pain.  Derm: Negative for rash or itching.  Neuro: Negative for weakness, abnormal sensation, seizure, frequent headaches, memory loss, confusion.  Psych: Negative for anxiety, depression, suicidal ideation, hallucinations.  Endo: Negative for unusual weight change.  Heme: Negative for bruising or bleeding. Allergy: Negative for rash or hives.        Physical Examination: Vital signs in last 24 hours: Temp:  [98.2 F (36.8 C)-98.8 F (37.1 C)] 98.3 F (36.8 C) (03/15 0715) Pulse Rate:  [84-90] 90 (03/15 0715) Resp:  [14-18] 14 (03/15 0715) BP: (99-149)/(43-69) 140/69 (03/15 0715) SpO2:  [94 %-99 %] 96 % (03/15 0715) Weight:  [224 lb 8 oz (101.8 kg)] 224 lb 8 oz (101.8 kg) (03/14 1811) Last BM Date: 01/24/17  General: Well-nourished, well-developed in no acute distress.  Head: Normocephalic, atraumatic.   Eyes: Conjunctiva pink, no icterus. Mouth: Oropharyngeal mucosa moist and pink , no lesions erythema or exudate. Neck: Supple without thyromegaly, masses, or lymphadenopathy.  Lungs: Clear to auscultation bilaterally.  Heart: Regular rate and rhythm, no murmurs rubs or gallops.  Abdomen: Bowel sounds are normal, nontender, nondistended, no hepatosplenomegaly or masses, no abdominal bruits or    hernia , no rebound or guarding.   Rectal: not performed Extremities: No lower extremity edema, clubbing, deformity.  Neuro: Alert and oriented x 4 , grossly normal neurologically.  Skin: Warm and dry, no rash or jaundice.   Psych: Alert and cooperative, normal mood and affect.        Intake/Output from previous day: 03/14 0701 - 03/15 0700 In: 120 [Blood:120] Out: -  Intake/Output this shift: Total I/O In: 368 [Blood:368] Out: -   Lab Results: CBC  Recent Labs  01/24/17 1249  WBC 11.8*  HGB 6.6*  HCT 21.5*  MCV 90.7  PLT 541*   BMET  Recent Labs  01/24/17 2200  NA 137  K 2.9*  CL 99*  CO2 30  GLUCOSE 70  BUN 22*  CREATININE 1.47*  CALCIUM 8.7*   LFT No results for input(s): BILITOT, BILIDIR, IBILI, ALKPHOS, AST, ALT, PROT, ALBUMIN in the last 72 hours.  Lipase No results for input(s): LIPASE in the last 72 hours.  PT/INR  Recent Labs  01/24/17 2200  LABPROT 30.6*  INR 2.87      Imaging Studies: Ct Angio Abd/pel W/ And/or W/o  Result Date: 01/12/2017 CLINICAL DATA:  Post endovascular  repair of abdominal aortic aneurysm. EXAM: CTA ABDOMEN AND PELVIS WITH CONTRAST TECHNIQUE: Multidetector  CT imaging of the abdomen and pelvis was performed using the standard protocol during bolus administration of intravenous contrast. Multiplanar reconstructed images and MIPs were obtained and reviewed to evaluate the vascular anatomy. CONTRAST:  One 0 cc Isovue 370 COMPARISON:  CT abdomen pelvis - 10/11/2016; 08/15/2016 FINDINGS: VASCULAR Aorta: Stable sequela of endovascular repair of infrarenal abdominal aortic aneurysm. The stent graft appears widely patent. The proximal and distal ends of the stent graft are well apposed against the walls of the infrarenal abdominal aorta as well as the bilateral common iliac arteries. The caliber of the native bilobed infrarenal abdominal aortic aneurysm is unchanged to slightly decreased in size with dominant cranial component measuring 5.5 x 5.9 x 5.7 cm (as measured in greatest short axis oblique axial - image 77, series 4, coronal image 76, series 7 and sagittal image 125, series 8 images respectively), and dominant caudal component measuring approximately 5.0 x 4.7 x 5.2 cm (as measured in greatest oblique short axis axial - image 93, series 4; coronal image 70, series 7 and sagittal - image 120, series 8, dimensions respectively), previously 5.9 x 5.8 x 5.8 and 5.4 x 5.4 x 5.4 cm when compared to the 08/2016 examination. Evaluation for endoleak is limited secondary to lack of delayed phase images. No perivascular stranding. Celiac: Minimal amount of eccentric mixed calcified and noncalcified atherosclerotic plaque involving the origin of the celiac artery, not resulting in a hemodynamically significant stenosis. Conventional branching pattern. SMA: There is a minimal amount of eccentric mixed calcified and noncalcified atherosclerotic plaque involving the origin the SMA, not resulting in hemodynamically significant stenosis. Conventional branching pattern. The distal  tributaries the SMA are widely patent without discrete intraluminal filling defect to suggest distal embolism. Renals: Solitary bilaterally. There is a minimal amount of eccentric mixed calcified and noncalcified atherosclerotic plaque involving the origin of the bilateral renal artery's, not resulting in hemodynamically significant stenosis. No vessel irregularity to suggest FMD. IMA: Occluded at its origin with early reconstitution via collateral supply from the SMA. Inflow: As above, the distal limbs of the aortic stent graft are well apposed against the walls of the bilateral common iliac arteries. The bilateral internal iliac arteries are diseased though patent. Unchanged mild fusiform ectasia of the left internal iliac artery measuring 1.4 cm in diameter (image 126, series 4, previously, unchanged. The bilateral external iliac arteries are mildly diseased and tortuous though without hemodynamically significant stenosis. Proximal Outflow: Moderate amount of eccentric mixed calcified and noncalcified atherosclerotic plaque within the bilateral common femoral artery is, not resulting in hemodynamically significant stenosis. Veins: The IVC and pelvic venous system appears widely patent on this non CTA examination. Review of the MIP images confirms the above findings. NON-VASCULAR Evaluation of the abdominal organs is limited to the arterial phase of enhancement. Lower chest: Limited visualization of the lower thorax demonstrates pleural calcifications and architectural distortion/volume loss involving the imaged portion of the right lower lobe. Minimal subsegmental atelectasis within the inferior segment of the lingula. No focal airspace opacities. No pleural effusion. Borderline cardiomegaly. Coronary artery calcifications. No pericardial effusion. Hepatobiliary: Normal hepatic contour. No discrete hyperenhancing hepatic lesions. Layering radiopaque gallstones within an under distended gallbladder. No intra  extrahepatic bili duct dilatation. No ascites. Pancreas: Normal appearance of the pancreas. Spleen: Normal appearance of the spleen. Note is made of 2 small splenules about the anterior aspect of the spleen. Adrenals/Urinary Tract: There is symmetric enhancement of the bilateral kidneys. Multiple renal cysts are again seen bilaterally with dominant exophytic cyst arises  from the anterior inferior pole of the left kidney measuring 3.7 cm in diameter (image 79, series 4). Additional bilateral subcentimeter hypoattenuating renal lesions are too small likely characterize of favored to represent additional renal cysts. No definite renal stones this postcontrast examination. No urinary obstruction or perinephric stranding. Normal appearance of the bilateral adrenal glands. Normal appearance of the urinary bladder given underdistention. Stomach/Bowel: Moderate colonic stool burden without evidence of enteric obstruction. Scattered colonic diverticulosis without evidence of diverticulitis. The bowel is normal in course and caliber without wall thickening or evidence of enteric obstruction. No pneumoperitoneum, pneumatosis or portal venous gas. Lymphatic: Scattered retroperitoneal and pelvic lymph nodes are numerous though individually not enlarged by size criteria with index aortocaval lymph node measuring 0.9 cm in greatest short axis diameter (image 65, series 4), index left sided periaortic lymph node measuring 0.8 cm (image 86, series 4) and index right external iliac chain lymph node measuring 0.8 cm in greatest short axis diameter (image 146, series 4). Index left-sided inguinal lymph node measures 1.7 cm in greatest short axis diameter (image 173, series 4) though maintains a benign fatty hilum. All lymph nodes appear similar to the 08/2016 examination and presumably reactive in etiology. , similar to the 09/2016 examination and presumably reactive in etiology. Reproductive: Normal appearance of the prostate gland.  No free fluid in the pelvic cul-de-sac. Other: Small bilateral mesenteric fat containing inguinal hernias, left greater than right. Musculoskeletal: No acute or aggressive osseous abnormalities. Moderate severe multilevel lumbar spine DDD, worse at T12-L1, L2-L3 and L4-L5 with disc space height loss, endplate irregularity and sclerosis. IMPRESSION: VASCULAR 1. Post endovascular repair of infrarenal abdominal aortic aneurysm. No evidence of endoleak, however examination is degraded secondary to lack of precontrast and delayed phase images. The caliber of the bilobed infrarenal abdominal aortic aneurysm is unchanged to minimally decreased in size compared to the preoperative examination performed 08/2016, currently measuring 5.9 cm in maximal diameter. 2.  Aortic Atherosclerosis (ICD10-170.0) 3.  Aortic aneurysm NOS (ICD10-I71.9) NON-VASCULAR 1. Stable examination of the abdomen and pelvis. Electronically Signed   By: Sandi Mariscal M.D.   On: 01/12/2017 09:32  [4 week]   Impression: 77 y/o male presented with profound anemia, "dark stools", in the setting of Coumadin/Plavix. Recent DES X 2 11/14/2016. History of prior stroke, PE, A. fib. AAA repair October 2017 as previously outlined.   Normal H/H 11/2016. Significant drop in hemoglobin requiring two units of blood as outpatient last month. Has been seeing dark stools for couple of weeks but thought it was the iron. No GI complaints. Remote history of bleeding peptic ulcer disease requiring surgery. Was taking aspirin powders at the time also reportedly had H pylori. Follow-up EGD in 2004 with antral gastritis, CLOtest was done but I do not have those records. Patient cannot recall details. Would be concerned about upper GI bleeding given transfusion dependent anemia in the setting of Coumadin/Plavix/recent aspirin. Etiology includes gastritis, peptic ulcer disease, AVMs. Cannot rule out small bowel etiology.  Plan: 1. Recheck PT/INR this morning. Patient  received Coumadin yesterday evening prior to presenting to the ER. Currently Plavix and Coumadin on hold. 2. EGD planned when INR appropriate.  I have discussed the risks, alternatives, benefits with regards to but not limited to the risk of reaction to medication, bleeding, infection, perforation and the patient is agreeable to proceed. Written consent to be obtained. 3. Continue PPI therapy.  We would like to thank you for the opportunity to participate in the care of Rodney Orozco.  Laureen Ochs. Bernarda Caffey [facility] [phone] 3/15/20189:10 AM     LOS: 1 day   Addendum: Labs as below. INR remains up, last dose of coumadin yesterday evening. H/H up appropriately after 2 units of prbcs.  WOULD CONSIDER CONTINUATION OF PLAVIX GIVEN RECENT DES x 2.  Lab Results  Component Value Date   INR 2.56 01/25/2017   INR 2.87 01/24/2017   INR 2.4 01/11/2017   Lab Results  Component Value Date   WBC 10.7 (H) 01/25/2017   HGB 8.4 (L) 01/25/2017   HCT 26.0 (L) 01/25/2017   MCV 88.4 01/25/2017   PLT 506 (H) 01/25/2017   Laureen Ochs. Bernarda Caffey [facility] [phone] 3/15/20189:53 AM

## 2017-01-25 NOTE — Progress Notes (Signed)
PROGRESS NOTE    Rodney Orozco  IHW:[id number]  DOB: 17-Mar-1941  DOA: 01/24/2017 PCP: [name], DO Outpatient Specialists:   Hospital course:  Rodney Orozco is a 76 y.o. male with medical history significant of AAA s/p repair in 10/17, h/o surgery for PUD, COPD, combined heart failure (EF 49%, grade 1 diastolic dysfunction 1/79/15), PAF, post-op PE in 2016, CVA on Warfarin, NSTEMI with DES x 2 on 12/06/16.  He reports having been hospitalizedabout 2 weeks ago for GI bleeding; he was transfused 2 units PRBC and had a negative CT (no further evaluation - this note is not available).  Today, Dr. Melina Copa sent him as a direct admission - he had blood work this AM and his Hgb was 6 again.    No abdominal pain or chest pain.  Takes iron, thought maybe this was turning stools dark, has persisted since last hospitalization.  No n/v.  +SOB with ambulation, improves with rest.  Denies fatigue - family disagrees.  Has been in/out of hospital since last August. A bit lightheaded when he stands up.    Assessment & Plan:   GI Bleed -Patient's has very mild symptoms of lightheadedness and fatigue - most likely caused by anemia secondary to upper GI bleeding.  -He has a h/o PUD requiring gastric surgery in the past. -His care in complicated by ongoing use of anticoagulants (see below). - will admit to med surg bed - GI consult  Pending Antiplatelet use in the setting of recent drug-eluting stent -He is approximately 1-1/2 months outside of stent placement. Still an high risk for stent thrombosis.  -Taking ASA and Plavix (as well as Coumadin, see below). Holding warfarin and plavix for now -While there is the risk of stent thrombosis, he still cannot use dual antiplatelet therapy in the setting of active bleeding   Afib, on Coumadin -Unfortunately, the patent took his Coumadin before coming to the ER. -Will follow PT/ INR.   -Consider reversal, but he is not actively bleeding at this time. -He  has an elevated stroke risk, CHA2DS2-VASc score is 7, with 9.6% stroke rate/year.  DM -Hold Glucophage -Cover with SSI  CBG (last 3)   Recent Labs  01/24/17 2120 01/25/17 0559 01/25/17 0756  GLUCAP 72 104* 112*   OSA -Wears home O2 for COPD during the day, BIPAP at night -Will continue  CHF -Given Lasix between units, otherwise compensated  DVT prophylaxis:  SCDs Code Status: Full - confirmed with patient/family Family Communication: Wife and daughter present throughout evaluation Disposition Plan:  Home once clinically improved Consults called: GI    Subjective: Pt really wants to eat something.   Objective: Vitals:   01/25/17 0345 01/25/17 0411 01/25/17 0427 01/25/17 0715  BP: (!) 122/59 130/60 138/62 140/69  Pulse: 88 84 84 90  Resp: '14 14 14 14  '$ Temp: 98.2 F (36.8 C) 98.2 F (36.8 C) 98.4 F (36.9 C) 98.3 F (36.8 C)  TempSrc: Oral Oral Oral Oral  SpO2: 98% 96% 96% 96%  Weight:      Height:        Intake/Output Summary (Last 24 hours) at 01/25/17 0839 Last data filed at 01/25/17 0715  Gross per 24 hour  Intake              488 ml  Output                0 ml  Net  488 ml   Filed Weights   01/24/17 1811  Weight: 101.8 kg (224 lb 8 oz)    Exam:  General exam: awake, alert, NAD.  Respiratory system:  No increased work of breathing. Cardiovascular system: S1 & S2 heard.  Gastrointestinal system: Abdomen is nondistended, soft and nontender. Normal bowel sounds heard. Central nervous system: Alert and oriented. No focal neurological deficits. Extremities: no cyanosis.   Data Reviewed: Basic Metabolic Panel:  Recent Labs Lab 01/24/17 2200  NA 137  K 2.9*  CL 99*  CO2 30  GLUCOSE 70  BUN 22*  CREATININE 1.47*  CALCIUM 8.7*   Liver Function Tests: No results for input(s): AST, ALT, ALKPHOS, BILITOT, PROT, ALBUMIN in the last 168 hours. No results for input(s): LIPASE, AMYLASE in the last 168 hours. No results for  input(s): AMMONIA in the last 168 hours. CBC:  Recent Labs Lab 01/24/17 1249  WBC 11.8*  NEUTROABS 8.8*  HGB 6.6*  HCT 21.5*  MCV 90.7  PLT 541*   Cardiac Enzymes: No results for input(s): CKTOTAL, CKMB, CKMBINDEX, TROPONINI in the last 168 hours. CBG (last 3)   Recent Labs  01/24/17 2120 01/25/17 0559 01/25/17 0756  GLUCAP 72 104* 112*   No results found for this or any previous visit (from the past 240 hour(s)).   Studies: No results found.   Scheduled Meds: . amLODipine  5 mg Oral Daily  . aspirin EC  81 mg Oral Daily  . donepezil  10 mg Oral QHS  . insulin aspart  0-15 Units Subcutaneous TID WC  . metoprolol tartrate  25 mg Oral BID  . pantoprazole  40 mg Intravenous Q12H   Continuous Infusions: . lactated ringers 50 mL/hr at 01/25/17 6811    Principal Problem:   GI bleed Active Problems:   Diabetes mellitus without complication (HCC)   Paroxysmal atrial fibrillation (HCC)   Central sleep apnea   Long-term (current) use of anticoagulants   AAA (abdominal aortic aneurysm) ([place]) s/p repair in oct 2017 by dr Scot Dock   Chronic combined systolic and diastolic CHF (congestive heart failure) ([place])   Time spent:   Irwin Brakeman, MD, FAAFP [facility] Pager [phone]  If 7PM-7AM, please contact night-coverage [email or web address] Password [facility] 01/25/2017, 8:39 AM    LOS: 1 day

## 2017-01-26 DIAGNOSIS — I714 Abdominal aortic aneurysm, without rupture: Secondary | ICD-10-CM

## 2017-01-26 DIAGNOSIS — Z7901 Long term (current) use of anticoagulants: Secondary | ICD-10-CM

## 2017-01-26 DIAGNOSIS — I480 Paroxysmal atrial fibrillation: Secondary | ICD-10-CM

## 2017-01-26 LAB — BASIC METABOLIC PANEL
Anion gap: 7 (ref 5–15)
BUN: 16 mg/dL (ref 6–20)
CHLORIDE: 103 mmol/L (ref 101–111)
CO2: 30 mmol/L (ref 22–32)
CREATININE: 1.31 mg/dL — AB (ref 0.61–1.24)
Calcium: 8.7 mg/dL — ABNORMAL LOW (ref 8.9–10.3)
GFR calc non Af Amer: 52 mL/min — ABNORMAL LOW (ref >60)
Glucose, Bld: 130 mg/dL — ABNORMAL HIGH (ref 65–99)
POTASSIUM: 3.2 mmol/L — AB (ref 3.5–5.1)
Sodium: 140 mmol/L (ref 135–145)

## 2017-01-26 LAB — CBC
HEMATOCRIT: 27.1 % — AB (ref 39.0–52.0)
Hemoglobin: 8.5 g/dL — ABNORMAL LOW (ref 13.0–17.0)
MCH: 28.1 pg (ref 26.0–34.0)
MCHC: 31.4 g/dL (ref 30.0–36.0)
MCV: 89.4 fL (ref 78.0–100.0)
PLATELETS: 524 10*3/uL — AB (ref 150–400)
RBC: 3.03 MIL/uL — ABNORMAL LOW (ref 4.22–5.81)
RDW: 15.1 % (ref 11.5–15.5)
WBC: 11.2 10*3/uL — ABNORMAL HIGH (ref 4.0–10.5)

## 2017-01-26 LAB — GLUCOSE, CAPILLARY
GLUCOSE-CAPILLARY: 155 mg/dL — AB (ref 65–99)
GLUCOSE-CAPILLARY: 124 mg/dL — AB (ref 65–99)
Glucose-Capillary: 157 mg/dL — ABNORMAL HIGH (ref 65–99)

## 2017-01-26 LAB — PROTIME-INR
INR: 2.36
Prothrombin Time: 26.2 seconds — ABNORMAL HIGH (ref 11.4–15.2)

## 2017-01-26 LAB — MAGNESIUM: Magnesium: 2.1 mg/dL (ref 1.7–2.4)

## 2017-01-26 MED ORDER — POTASSIUM CHLORIDE CRYS ER 20 MEQ PO TBCR
40.0000 meq | EXTENDED_RELEASE_TABLET | Freq: Once | ORAL | Status: AC
  Administered 2017-01-26: 40 meq via ORAL
  Filled 2017-01-26: qty 2

## 2017-01-26 NOTE — Progress Notes (Signed)
Subjective: Feeling well today. Denies abdominal pain, N/V, hematochezia, melena. No GI complaints at this time.  Objective: Vital signs in last 24 hours: Temp:  [97.7 F (36.5 C)-98.3 F (36.8 C)] 98.3 F (36.8 C) (03/16 1414) Pulse Rate:  [78-83] 79 (03/16 1414) Resp:  [18] 18 (03/16 1414) BP: (141-145)/(69-76) 143/70 (03/16 1414) SpO2:  [93 %-97 %] 97 % (03/16 1414) Last BM Date: 01/24/17 General:   Alert and oriented, pleasant Head:  Normocephalic and atraumatic. Eyes:  No icterus, sclera clear. Conjuctiva pink.  Heart:  S1, S2 present, no murmurs noted. HR does not seem to be irregular today. Lungs: Clear to auscultation bilaterally, without wheezing, rales, or rhonchi.  Abdomen:  Bowel sounds present, rounded but soft, non-tender, non-distended. No HSM or hernias noted. No rebound or guarding. No masses appreciated  Msk:  Symmetrical without gross deformities. Pulses:  Normal bilateral DP pulses noted. Extremities:  Without clubbing or edema. Neurologic:  Alert and  oriented x4;  grossly normal neurologically. Psych:  Alert and cooperative. Normal mood and affect.  Intake/Output from previous day: 03/15 0701 - 03/16 0700 In: 1834.7 [P.O.:480; I.V.:986.7; Blood:368] Out: 900 [Urine:900] Intake/Output this shift: Total I/O In: 600 [P.O.:600] Out: -   Lab Results:  Recent Labs  01/24/17 1249 01/25/17 0846 01/26/17 0416  WBC 11.8* 10.7* 11.2*  HGB 6.6* 8.4* 8.5*  HCT 21.5* 26.0* 27.1*  PLT 541* 506* 524*   BMET  Recent Labs  01/24/17 2200 01/25/17 0846 01/26/17 0416  NA 137 138 140  K 2.9* 2.9* 3.2*  CL 99* 101 103  CO2 '30 29 30  '$ GLUCOSE 70 122* 130*  BUN 22* 20 16  CREATININE 1.47* 1.46* 1.31*  CALCIUM 8.7* 8.5* 8.7*   LFT No results for input(s): PROT, ALBUMIN, AST, ALT, ALKPHOS, BILITOT, BILIDIR, IBILI in the last 72 hours. PT/INR  Recent Labs  01/25/17 0846 01/26/17 0416  LABPROT 28.0* 26.2*  INR 2.56 2.36   Hepatitis Panel No  results for input(s): HEPBSAG, HCVAB, HEPAIGM, HEPBIGM in the last 72 hours.   Studies/Results: No results found.  Assessment: 76 y/o male presented with profound anemia, "dark stools", in the setting of Coumadin/Plavix. Recent DES X 2 on 11/14/2016. History of prior stroke, PE, A. fib. AAA repair October 2017 as previously outlined.   Normal H/H 11/2016. Significant drop in hemoglobin requiring two units of blood as outpatient last month. Has been seeing dark stools for couple of weeks but thought it was the iron. No GI complaints. Remote history of bleeding peptic ulcer disease requiring surgery. Was taking aspirin powders at the time also reportedly had H pylori. Follow-up EGD in 2004 with antral gastritis, CLOtest was done but I do not have those records. Patient cannot recall details. Would be concerned about upper GI bleeding given transfusion dependent anemia in the setting of Coumadin/Plavix/recent aspirin. Etiology includes gastritis, peptic ulcer disease, AVMs. Cannot rule out small bowel etiology.  Remains hemodynamically stable today. plavix has been resumed based on prior recommendations. Coumadin continues to be help. No significant anemia symptoms. No recurrent obvious GI bleed in the last 24 hours. INR today 2.36. Hgb today 8.5 and stable compared to yesterday (8.4). BUN normal, Cr elevated but at baseline.  Plan: 1. Monitor for recurrent GI bleed 2. Follow H/H for significant drop 3. Continue to hold Coumadin; INR needs to be <1.8-2.0 for EGD 4. Check INR and CBC tomorrow 5. Transfuse as necessary 6. Supportive measures.   Thank you for allowing [place] to  participate in the care of [place], DNP, AGNP-C Adult & Gerontological Nurse Practitioner [facility], [facility]     LOS: 2 days    01/26/2017, 2:40 PM

## 2017-01-26 NOTE — Progress Notes (Signed)
PROGRESS NOTE    Rodney Orozco  IOE:[id number]  DOB: 10/02/1941  DOA: 01/24/2017 PCP: [name], DO Outpatient Specialists:  Hospital course:  Rodney Orozco is a 76 y.o. male with medical history significant of AAA s/p repair in 10/17, h/o surgery for PUD, COPD, combined heart failure (EF 18%, grade 1 diastolic dysfunction 2/99/37), PAF, post-op PE in 2016, CVA on Warfarin, NSTEMI with DES x 2 on 12/06/16.  He reports having been hospitalizedabout 2 weeks ago for GI bleeding; he was transfused 2 units PRBC and had a negative CT (no further evaluation - this note is not available).  Today, Dr. Melina Copa sent him as a direct admission - he had blood work this AM and his Hgb was 6 again.    No abdominal pain or chest pain.  Takes iron, thought maybe this was turning stools dark, has persisted since last hospitalization.  No n/v.  +SOB with ambulation, improves with rest.  Denies fatigue - family disagrees.  Has been in/out of hospital since last August. A bit lightheaded when he stands up.    Assessment & Plan:   GI Bleed -Patient's has very mild symptoms of lightheadedness and fatigue - most likely caused by anemia secondary to upper GI bleeding.  -He has a h/o PUD requiring gastric surgery in the past. -His care in complicated by ongoing use of anticoagulants (see below). - will admit to med surg bed - GI consult  Pending Antiplatelet use in the setting of recent drug-eluting stent -He is approximately 1-1/2 months outside of stent placement. Still an high risk for stent thrombosis.  -Taking ASA and Plavix (as well as Coumadin, see below). Holding warfarin for now.  I spoke with GI, ok to continue plavix. EGD when INR<2 -While there is the risk of stent thrombosis, he still cannot use dual antiplatelet therapy in the setting of active bleeding   Afib, on Coumadin -Unfortunately, the patent took his Coumadin before coming to the ER. -Will follow PT/ INR.  INR is still >2.   -He is  not actively bleeding at this time and Hg stable.  -He has an elevated stroke risk, CHA2DS2-VASc score is 7, with 9.6% stroke rate/year.  DM -Hold Glucophage -Cover with SSI  CBG (last 3)   Recent Labs  01/25/17 2141 01/26/17 0744 01/26/17 1124  GLUCAP 109* 155* 157*   OSA -Wears home O2 for COPD during the day, BIPAP at night -Will continue  CHF -Given Lasix between units, otherwise compensated  DVT prophylaxis:  SCDs Code Status: Full - confirmed with patient/family Family Communication: Wife and daughter present throughout evaluation Disposition Plan:  Home once clinically improved Consults called: GI    Subjective: Pt really wants to eat something.   Objective: Vitals:   01/25/17 1400 01/25/17 2010 01/25/17 2027 01/26/17 0525  BP: 136/71 (!) 141/76  (!) 145/69  Pulse: 88 83  78  Resp: '16 18  18  '$ Temp: 98.1 F (36.7 C) 97.7 F (36.5 C)  98.2 F (36.8 C)  TempSrc:  Oral  Oral  SpO2: 98% 95% 93% 97%  Weight:      Height:        Intake/Output Summary (Last 24 hours) at 01/26/17 1309 Last data filed at 01/26/17 0300  Gross per 24 hour  Intake          1466.67 ml  Output              600 ml  Net  866.67 ml   Filed Weights   01/24/17 1811  Weight: 101.8 kg (224 lb 8 oz)    Exam:  General exam: awake, alert, NAD.  Respiratory system:  No increased work of breathing. Cardiovascular system: S1 & S2 heard.  Gastrointestinal system: Abdomen is nondistended, soft and nontender. Normal bowel sounds heard. Central nervous system: Alert and oriented. No focal neurological deficits. Extremities: no cyanosis.   Data Reviewed: Basic Metabolic Panel:  Recent Labs Lab 01/24/17 2200 01/25/17 0846 01/26/17 0416  NA 137 138 140  K 2.9* 2.9* 3.2*  CL 99* 101 103  CO2 '30 29 30  '$ GLUCOSE 70 122* 130*  BUN 22* 20 16  CREATININE 1.47* 1.46* 1.31*  CALCIUM 8.7* 8.5* 8.7*  MG  --   --  2.1   Liver Function Tests: No results for input(s): AST,  ALT, ALKPHOS, BILITOT, PROT, ALBUMIN in the last 168 hours. No results for input(s): LIPASE, AMYLASE in the last 168 hours. No results for input(s): AMMONIA in the last 168 hours. CBC:  Recent Labs Lab 01/24/17 1249 01/25/17 0846 01/26/17 0416  WBC 11.8* 10.7* 11.2*  NEUTROABS 8.8*  --   --   HGB 6.6* 8.4* 8.5*  HCT 21.5* 26.0* 27.1*  MCV 90.7 88.4 89.4  PLT 541* 506* 524*   Cardiac Enzymes: No results for input(s): CKTOTAL, CKMB, CKMBINDEX, TROPONINI in the last 168 hours. CBG (last 3)   Recent Labs  01/25/17 2141 01/26/17 0744 01/26/17 1124  GLUCAP 109* 155* 157*   No results found for this or any previous visit (from the past 240 hour(s)).   Studies: No results found.   Scheduled Meds: . amLODipine  5 mg Oral Daily  . clopidogrel  75 mg Oral Daily  . donepezil  10 mg Oral QHS  . insulin aspart  0-15 Units Subcutaneous TID WC  . metoprolol tartrate  25 mg Oral BID  . pantoprazole  40 mg Intravenous Q12H  . potassium chloride  20 mEq Oral BID   Continuous Infusions: . lactated ringers 50 mL/hr at 01/26/17 0545    Principal Problem:   GI bleed Active Problems:   Diabetes mellitus without complication (HCC)   Paroxysmal atrial fibrillation (HCC)   Central sleep apnea   Long-term (current) use of anticoagulants   AAA (abdominal aortic aneurysm) ([place]) s/p repair in oct 2017 by dr Scot Dock   Chronic combined systolic and diastolic CHF (congestive heart failure) ([place])   Transfusion-dependent anemia  Time spent:   Irwin Brakeman, MD, FAAFP [facility] Pager [phone]  If 7PM-7AM, please contact night-coverage [email or web address] Password [email or web address] 01/26/2017, 1:09 PM    LOS: 2 days

## 2017-01-26 NOTE — Care Management Important Message (Signed)
Important Message  Patient Details  Name: Rodney Orozco MRN: [id number] Date of Birth: 1940/11/17   Medicare Important Message Given:  Yes    Sherald Barge, RN 01/26/2017, 1:49 PM

## 2017-01-27 ENCOUNTER — Encounter (HOSPITAL_COMMUNITY)
Admission: AD | Disposition: A | Discharge status: Home / Self Care | Payer: Self-pay | Source: Ambulatory Visit | Attending: Family Medicine

## 2017-01-27 DIAGNOSIS — K921 Melena: Secondary | ICD-10-CM

## 2017-01-27 DIAGNOSIS — D62 Acute posthemorrhagic anemia: Secondary | ICD-10-CM

## 2017-01-27 HISTORY — PX: ESOPHAGOGASTRODUODENOSCOPY: SHX5428

## 2017-01-27 LAB — CBC
HCT: 24.7 % — ABNORMAL LOW (ref 39.0–52.0)
Hemoglobin: 7.6 g/dL — ABNORMAL LOW (ref 13.0–17.0)
MCH: 27.9 pg (ref 26.0–34.0)
MCHC: 30.8 g/dL (ref 30.0–36.0)
MCV: 90.8 fL (ref 78.0–100.0)
PLATELETS: 492 10*3/uL — AB (ref 150–400)
RBC: 2.72 MIL/uL — ABNORMAL LOW (ref 4.22–5.81)
RDW: 15.3 % (ref 11.5–15.5)
WBC: 10.1 10*3/uL (ref 4.0–10.5)

## 2017-01-27 LAB — BASIC METABOLIC PANEL
Anion gap: 5 (ref 5–15)
BUN: 18 mg/dL (ref 6–20)
CHLORIDE: 108 mmol/L (ref 101–111)
CO2: 28 mmol/L (ref 22–32)
CREATININE: 1.3 mg/dL — AB (ref 0.61–1.24)
Calcium: 8.4 mg/dL — ABNORMAL LOW (ref 8.9–10.3)
GFR calc Af Amer: >60 mL/min (ref >60)
GFR calc non Af Amer: 53 mL/min — ABNORMAL LOW (ref >60)
Glucose, Bld: 155 mg/dL — ABNORMAL HIGH (ref 65–99)
Potassium: 4 mmol/L (ref 3.5–5.1)
SODIUM: 141 mmol/L (ref 135–145)

## 2017-01-27 LAB — GLUCOSE, CAPILLARY
GLUCOSE-CAPILLARY: 149 mg/dL — AB (ref 65–99)
GLUCOSE-CAPILLARY: 105 mg/dL — AB (ref 65–99)
GLUCOSE-CAPILLARY: 117 mg/dL — AB (ref 65–99)
Glucose-Capillary: 154 mg/dL — ABNORMAL HIGH (ref 65–99)

## 2017-01-27 LAB — PROTIME-INR
INR: 1.68
Prothrombin Time: 20 seconds — ABNORMAL HIGH (ref 11.4–15.2)

## 2017-01-27 LAB — HEMOGLOBIN AND HEMATOCRIT, BLOOD
HCT: 27 % — ABNORMAL LOW (ref 39.0–52.0)
HEMOGLOBIN: 8.5 g/dL — AB (ref 13.0–17.0)

## 2017-01-27 LAB — PREPARE RBC (CROSSMATCH)

## 2017-01-27 SURGERY — EGD (ESOPHAGOGASTRODUODENOSCOPY)
Anesthesia: Moderate Sedation

## 2017-01-27 MED ORDER — SODIUM CHLORIDE 0.9 % IV SOLN: INTRAVENOUS | Status: DC

## 2017-01-27 MED ORDER — SODIUM CHLORIDE 0.9 % IV SOLN
Freq: Once | INTRAVENOUS | Status: AC
  Administered 2017-01-27: 11:00:00 via INTRAVENOUS

## 2017-01-27 MED ORDER — MIDAZOLAM HCL 5 MG/5ML IJ SOLN
INTRAMUSCULAR | Status: DC | PRN
  Administered 2017-01-27 (×2): 2 mg via INTRAVENOUS
  Administered 2017-01-27: 1 mg via INTRAVENOUS

## 2017-01-27 MED ORDER — LIDOCAINE VISCOUS 2 % MT SOLN
OROMUCOSAL | Status: AC
  Filled 2017-01-27: qty 15

## 2017-01-27 MED ORDER — SODIUM CHLORIDE 0.9 % IV SOLN
INTRAVENOUS | Status: DC
  Administered 2017-01-27: 20:00:00 via INTRAVENOUS

## 2017-01-27 MED ORDER — MEPERIDINE HCL 50 MG/ML IJ SOLN
INTRAMUSCULAR | Status: DC | PRN
  Administered 2017-01-27 (×2): 25 mg via INTRAVENOUS

## 2017-01-27 MED ORDER — SODIUM CHLORIDE 0.9 % IV SOLN
INTRAVENOUS | Status: DC
  Administered 2017-01-27: 15:00:00 via INTRAVENOUS

## 2017-01-27 MED ORDER — PEG 3350-KCL-NA BICARB-NACL 420 G PO SOLR
4000.0000 mL | Freq: Once | ORAL | Status: AC
  Administered 2017-01-28: 4000 mL via ORAL
  Filled 2017-01-27: qty 4000

## 2017-01-27 MED ORDER — BISACODYL 5 MG PO TBEC
10.0000 mg | DELAYED_RELEASE_TABLET | Freq: Once | ORAL | Status: AC
  Administered 2017-01-27: 10 mg via ORAL
  Filled 2017-01-27: qty 2

## 2017-01-27 MED ORDER — MEPERIDINE HCL 50 MG/ML IJ SOLN
INTRAMUSCULAR | Status: AC
  Filled 2017-01-27: qty 1

## 2017-01-27 MED ORDER — MIDAZOLAM HCL 5 MG/5ML IJ SOLN
INTRAMUSCULAR | Status: AC
  Filled 2017-01-27: qty 10

## 2017-01-27 NOTE — Progress Notes (Signed)
PROGRESS NOTE    Rodney Orozco  IEP:[id number]  DOB: 1940-11-28  DOA: 01/24/2017 PCP: [name], DO Outpatient Specialists:  Hospital course:  Rodney Orozco is a 76 y.o. male with medical history significant of AAA s/p repair in 10/17, h/o surgery for PUD, COPD, combined heart failure (EF 66%, grade 1 diastolic dysfunction 0/63/01), PAF, post-op PE in 2016, CVA on Warfarin, NSTEMI with DES x 2 on 12/06/16.  He reports having been hospitalizedabout 2 weeks ago for GI bleeding; he was transfused 2 units PRBC and had a negative CT (no further evaluation - this note is not available).  Today, Dr. Melina Copa sent him as a direct admission - he had blood work this AM and his Hgb was 6 again.    No abdominal pain or chest pain.  Takes iron, thought maybe this was turning stools dark, has persisted since last hospitalization.  No n/v.  +SOB with ambulation, improves with rest.  Denies fatigue - family disagrees.  Has been in/out of hospital since last August. A bit lightheaded when he stands up.    Assessment & Plan:   GI Bleed -Patient's has very mild symptoms of lightheadedness and fatigue - most likely caused by anemia secondary to upper GI bleeding.  -He has a h/o PUD requiring gastric surgery in the past. -His care in complicated by ongoing use of anticoagulants (see below). - will admit to med surg bed - GI consult  Pending Antiplatelet use in the setting of recent drug-eluting stent -He is approximately 1-1/2 months outside of stent placement. Still an high risk for stent thrombosis.  -Taking ASA and Plavix (as well as Coumadin, see below). Holding warfarin for now.  I spoke with GI, ok to continue plavix. EGD when INR<2 -While there is the risk of stent thrombosis, he still cannot use dual antiplatelet therapy in the setting of active bleeding - Hg trending down, 1 unit PRBC ordered and for EGD later today with Dr. Laural Golden.     Afib, on Coumadin -Unfortunately, the patent took his  Coumadin before coming to the ER. -Will follow PT/ INR.  INR is still >2.   -He is not actively bleeding at this time and Hg stable.  -He has an elevated stroke risk, CHA2DS2-VASc score is 7, with 9.6% stroke rate/year.  DM -Hold Glucophage -Cover with SSI  CBG (last 3)   Recent Labs  01/26/17 1612 01/27/17 0748 01/27/17 1124  GLUCAP 124* 154* 149*   OSA -Wears home O2 for COPD during the day, BIPAP at night -Will continue  CHF -Given Lasix between units, otherwise compensated  DVT prophylaxis:  SCDs Code Status: Full - confirmed with patient/family Family Communication: Wife and daughter present throughout evaluation Disposition Plan:  Home once clinically improved Consults called: GI    Subjective: Pt really wants to eat something.   Objective: Vitals:   01/26/17 2201 01/27/17 0645 01/27/17 1133 01/27/17 1200  BP: (!) 148/62 (!) 153/79 (!) 151/68 139/72  Pulse: 82 78 71 69  Resp: '20 14 20 20  '$ Temp: 98.3 F (36.8 C) 98.5 F (36.9 C) 98.1 F (36.7 C) 98.3 F (36.8 C)  TempSrc: Oral Oral Oral Oral  SpO2: 96% 99% 99% 99%  Weight:      Height:        Intake/Output Summary (Last 24 hours) at 01/27/17 1217 Last data filed at 01/27/17 1145  Gross per 24 hour  Intake  970 ml  Output                0 ml  Net              970 ml   Filed Weights   01/24/17 1811  Weight: 101.8 kg (224 lb 8 oz)    Exam:  General exam: awake, alert, NAD.  Respiratory system:  No increased work of breathing. Cardiovascular system: S1 & S2 heard.  Gastrointestinal system: Abdomen is nondistended, soft and nontender. Normal bowel sounds heard. Central nervous system: Alert and oriented. No focal neurological deficits. Extremities: no cyanosis.   Data Reviewed: Basic Metabolic Panel:  Recent Labs Lab 01/24/17 2200 01/25/17 0846 01/26/17 0416 01/27/17 0657  NA 137 138 140 141  K 2.9* 2.9* 3.2* 4.0  CL 99* 101 103 108  CO2 '30 29 30 28  '$ GLUCOSE 70 122*  130* 155*  BUN 22* '20 16 18  '$ CREATININE 1.47* 1.46* 1.31* 1.30*  CALCIUM 8.7* 8.5* 8.7* 8.4*  MG  --   --  2.1  --    Liver Function Tests: No results for input(s): AST, ALT, ALKPHOS, BILITOT, PROT, ALBUMIN in the last 168 hours. No results for input(s): LIPASE, AMYLASE in the last 168 hours. No results for input(s): AMMONIA in the last 168 hours. CBC:  Recent Labs Lab 01/24/17 1249 01/25/17 0846 01/26/17 0416 01/27/17 0657  WBC 11.8* 10.7* 11.2* 10.1  NEUTROABS 8.8*  --   --   --   HGB 6.6* 8.4* 8.5* 7.6*  HCT 21.5* 26.0* 27.1* 24.7*  MCV 90.7 88.4 89.4 90.8  PLT 541* 506* 524* 492*   Cardiac Enzymes: No results for input(s): CKTOTAL, CKMB, CKMBINDEX, TROPONINI in the last 168 hours. CBG (last 3)   Recent Labs  01/26/17 1612 01/27/17 0748 01/27/17 1124  GLUCAP 124* 154* 149*   No results found for this or any previous visit (from the past 240 hour(s)).   Studies: No results found.   Scheduled Meds: . amLODipine  5 mg Oral Daily  . clopidogrel  75 mg Oral Daily  . donepezil  10 mg Oral QHS  . insulin aspart  0-15 Units Subcutaneous TID WC  . metoprolol tartrate  25 mg Oral BID  . pantoprazole  40 mg Intravenous Q12H   Continuous Infusions: . lactated ringers 50 mL/hr at 01/26/17 0545    Principal Problem:   GI bleed Active Problems:   Diabetes mellitus without complication (HCC)   Paroxysmal atrial fibrillation (HCC)   Central sleep apnea   Long-term (current) use of anticoagulants   AAA (abdominal aortic aneurysm) ([place]) s/p repair in oct 2017 by dr Scot Dock   Chronic combined systolic and diastolic CHF (congestive heart failure) ([place])   Transfusion-dependent anemia  Time spent:   Irwin Brakeman, MD, FAAFP [facility] Pager [phone]  If 7PM-7AM, please contact night-coverage [email or web address] Password [email or web address] 01/27/2017, 12:17 PM    LOS: 3 days

## 2017-01-27 NOTE — Progress Notes (Signed)
Patients blood transfusion stopped at 1442 and flushed with normal saline.  Site clean, dry and intact with no complaints of pain at site.  Normal saline started for endoscopy procedure.

## 2017-01-27 NOTE — Op Note (Signed)
[facility] Patient Name: Rodney Orozco Procedure Date: 01/27/2017 1:57 PM MRN: [id number] Date of Birth: Feb 11, 1941 Attending MD: Hildred Laser , MD CSN: [id number] Age: 76 Admit Type: Inpatient Procedure:                Upper GI endoscopy Indications:              Acute post hemorrhagic anemia, Melena Providers:                Hildred Laser, MD, Lurline Del, RN, Charlyne Petrin                            RN, RN Referring MD:             [name], MD Medicines:                Lidocaine spray, Meperidine 50 mg IV, Midazolam 5                            mg IV Complications:            No immediate complications. Estimated Blood Loss:     Estimated blood loss: none. Procedure:                Pre-Anesthesia Assessment:                           - Prior to the procedure, a History and Physical                            was performed, and patient medications and                            allergies were reviewed. The patient's tolerance of                            previous anesthesia was also reviewed. The risks                            and benefits of the procedure and the sedation                            options and risks were discussed with the patient.                            All questions were answered, and informed consent                            was obtained. Prior Anticoagulants: The patient                            last took Coumadin (warfarin) 2 days and Plavix                            (clopidogrel) 1 day prior to the procedure. ASA  Grade Assessment: III - A patient with severe                            systemic disease. After reviewing the risks and                            benefits, the patient was deemed in satisfactory                            condition to undergo the procedure.                           After obtaining informed consent, the endoscope was                            passed under direct vision.  Throughout the                            procedure, the patient's blood pressure, pulse, and                            oxygen saturations were monitored continuously. The                            [id number]) scope was introduced through the                            and advanced to the third part of duodenum. The                            upper GI endoscopy was accomplished without                            difficulty. The patient tolerated the procedure                            well. Scope In: 2:58:04 PM Scope Out: 3:04:43 PM Total Procedure Duration: 0 hours 6 minutes 39 seconds  Findings:      The examined esophagus was normal.      The Z-line was regular and was found 41 cm from the incisors.      The entire examined stomach was normal.      The duodenal bulb, second portion of the duodenum and third portion of       the duodenum were normal. Impression:               - Normal esophagus.                           - Z-line regular, 41 cm from the incisors.                           - Normal stomach.                           - Normal duodenal  bulb, second portion of the                            duodenum and third portion of the duodenum.                           - No specimens collected. Moderate Sedation:      Moderate (conscious) sedation was administered by the endoscopy nurse       and supervised by the endoscopist. The following parameters were       monitored: oxygen saturation, heart rate, blood pressure, CO2       capnography and response to care. Total physician intraservice time was       15 minutes. Recommendation:           - Return patient to hospital ward for ongoing care.                           - Perform a colonoscopy tomorrow.                           - Clear liquid diet today.                           - Continue present medications. Procedure Code(s):        --- Professional ---                           [phone], Esophagogastroduodenoscopy,  flexible,                            transoral; diagnostic, including collection of                            specimen(s) by brushing or washing, when performed                            (separate procedure)                           99152, Moderate sedation services provided by the                            same physician or other qualified health care                            professional performing the diagnostic or                            therapeutic service that the sedation supports,                            requiring the presence of an independent trained                            observer to assist in the monitoring of the  patient's level of consciousness and physiological                            status; initial 15 minutes of intraservice time,                            patient age 46 years or older Diagnosis Code(s):        --- Professional ---                           D62, Acute posthemorrhagic anemia                           K92.1, Melena (includes Hematochezia) CPT copyright 2016 American [employer] Association. All rights reserved. The codes documented in this report are preliminary and upon coder review may  be revised to meet current compliance requirements. Hildred Laser, MD Hildred Laser, MD 01/27/2017 5:19:09 PM This report has been signed electronically. Number of Addenda: 0

## 2017-01-27 NOTE — Progress Notes (Signed)
  Subjective:  Patient has no complaints. He denies chest pain shortness of breath abdominal pain. No BM this morning. According to his wife is been having tarry stools for past few days.  Objective: Blood pressure (!) 151/68, pulse 71, temperature 98.1 F (36.7 C), temperature source Oral, resp. rate 20, height 5' 10.5" (1.791 m), weight 224 lb 8 oz (101.8 kg), SpO2 99 %. Patient is alert and in no acute distress. Conjunctiva is pale. Sclera is nonicteric Oropharyngeal mucosa is normal. No neck masses or thyromegaly noted. Cardiac exam with regular rhythm normal S1 and S2. No murmur or gallop noted. Lungs are clear to auscultation. Abdomen is full but soft and nontender without organomegaly or masses.  No LE edema or clubbing noted.  Labs/studies Results:   Recent Labs  01/25/17 0846 01/26/17 0416 01/27/17 0657  WBC 10.7* 11.2* 10.1  HGB 8.4* 8.5* 7.6*  HCT 26.0* 27.1* 24.7*  PLT 506* 524* 492*    BMET   Recent Labs  01/25/17 0846 01/26/17 0416 01/27/17 0657  NA 138 140 141  K 2.9* 3.2* 4.0  CL 101 103 108  CO2 '29 30 28  '$ GLUCOSE 122* 130* 155*  BUN '20 16 18  '$ CREATININE 1.46* 1.31* 1.30*  CALCIUM 8.5* 8.7* 8.4*      Recent Labs  01/26/17 0416 01/27/17 0657  LABPROT 26.2* 20.0*  INR 2.36 1.68     Assessment:  #1. Anemia secondary to GI bleed in the setting of anticoagulant and antiplatelet agent. Patient has received 2 units of PRBCs. Hemoglobin is still low. He will get another unit of PRBCs. As history significant for peptic ulcer disease for which she had surgery several years ago. Patient's INR is down to 1.6. Patient deemed to be stable for EGD.    Recommendations:  Transfuse 1 unit of PRBCs today. Esophagogastroduodenoscopy later today.

## 2017-01-27 NOTE — Progress Notes (Signed)
Posttransfusion hemoglobin is 8.5. For repeat CBC in a.m.

## 2017-01-28 ENCOUNTER — Encounter (HOSPITAL_COMMUNITY): Payer: Self-pay | Admitting: *Deleted

## 2017-01-28 ENCOUNTER — Encounter (HOSPITAL_COMMUNITY)
Admission: AD | Disposition: A | Discharge status: Home / Self Care | Payer: Self-pay | Source: Ambulatory Visit | Attending: Family Medicine

## 2017-01-28 DIAGNOSIS — K921 Melena: Secondary | ICD-10-CM

## 2017-01-28 DIAGNOSIS — D62 Acute posthemorrhagic anemia: Secondary | ICD-10-CM

## 2017-01-28 DIAGNOSIS — K5730 Diverticulosis of large intestine without perforation or abscess without bleeding: Secondary | ICD-10-CM

## 2017-01-28 HISTORY — PX: COLONOSCOPY: SHX5424

## 2017-01-28 LAB — BPAM RBC
BLOOD PRODUCT EXPIRATION DATE: 201804022359
Blood Product Expiration Date: 201804022359
Blood Product Expiration Date: 201804022359
ISSUE DATE / TIME: 201803150019
ISSUE DATE / TIME: 201803150358
ISSUE DATE / TIME: 201803171136
UNIT TYPE AND RH: 600
Unit Type and Rh: 600
Unit Type and Rh: 600

## 2017-01-28 LAB — TYPE AND SCREEN
ABO/RH(D): A NEG
Antibody Screen: NEGATIVE
UNIT DIVISION: 0
Unit division: 0
Unit division: 0

## 2017-01-28 LAB — GLUCOSE, CAPILLARY
GLUCOSE-CAPILLARY: 160 mg/dL — AB (ref 65–99)
Glucose-Capillary: 128 mg/dL — ABNORMAL HIGH (ref 65–99)
Glucose-Capillary: 98 mg/dL (ref 65–99)
Glucose-Capillary: 126 mg/dL — ABNORMAL HIGH (ref 65–99)
Glucose-Capillary: 198 mg/dL — ABNORMAL HIGH (ref 65–99)

## 2017-01-28 LAB — CBC
HEMATOCRIT: 30.9 % — AB (ref 39.0–52.0)
HEMOGLOBIN: 9.6 g/dL — AB (ref 13.0–17.0)
MCH: 28.2 pg (ref 26.0–34.0)
MCHC: 31.1 g/dL (ref 30.0–36.0)
MCV: 90.9 fL (ref 78.0–100.0)
PLATELETS: 573 10*3/uL — AB (ref 150–400)
RBC: 3.4 MIL/uL — AB (ref 4.22–5.81)
RDW: 15.2 % (ref 11.5–15.5)
WBC: 11.4 10*3/uL — AB (ref 4.0–10.5)

## 2017-01-28 LAB — PROTIME-INR
INR: 1.24
PROTHROMBIN TIME: 15.6 s — AB (ref 11.4–15.2)

## 2017-01-28 SURGERY — COLONOSCOPY
Anesthesia: Moderate Sedation

## 2017-01-28 MED ORDER — STERILE WATER FOR IRRIGATION IR SOLN
Status: DC | PRN
  Administered 2017-01-28: 2.5 mL

## 2017-01-28 MED ORDER — MEPERIDINE HCL 50 MG/ML IJ SOLN
INTRAMUSCULAR | Status: DC | PRN
  Administered 2017-01-28 (×2): 25 mg via INTRAVENOUS

## 2017-01-28 MED ORDER — METOCLOPRAMIDE HCL 5 MG/ML IJ SOLN
10.0000 mg | Freq: Once | INTRAMUSCULAR | Status: AC
  Administered 2017-01-28: 10 mg via INTRAVENOUS
  Filled 2017-01-28: qty 2

## 2017-01-28 MED ORDER — MIDAZOLAM HCL 5 MG/5ML IJ SOLN
INTRAMUSCULAR | Status: AC
  Filled 2017-01-28: qty 10

## 2017-01-28 MED ORDER — MEPERIDINE HCL 50 MG/ML IJ SOLN
INTRAMUSCULAR | Status: AC
  Filled 2017-01-28: qty 1

## 2017-01-28 MED ORDER — CLOPIDOGREL BISULFATE 75 MG PO TABS
75.0000 mg | ORAL_TABLET | Freq: Every day | ORAL | Status: DC
  Administered 2017-01-28 – 2017-01-29 (×2): 75 mg via ORAL
  Filled 2017-01-28 (×3): qty 1

## 2017-01-28 MED ORDER — MIDAZOLAM HCL 5 MG/5ML IJ SOLN
INTRAMUSCULAR | Status: DC | PRN
  Administered 2017-01-28 (×2): 2 mg via INTRAVENOUS
  Administered 2017-01-28: 1 mg via INTRAVENOUS

## 2017-01-28 NOTE — Progress Notes (Signed)
Patient refused BiPAP/CPAP

## 2017-01-28 NOTE — Progress Notes (Signed)
PROGRESS NOTE    Rodney Orozco  DQQ:[id number]  DOB: [birth year]  DOA: 01/24/2017 PCP: [name], DO Outpatient Specialists:  Hospital course:  Rodney Orozco is a 76 y.o. male with medical history significant of AAA s/p repair in 10/17, h/o surgery for PUD, COPD, combined heart failure (EF 19%, grade 1 diastolic dysfunction 02/28/39), PAF, post-op PE in 2016, CVA on Warfarin, NSTEMI with DES x 2 on 12/06/16.  He reports having been hospitalizedabout 2 weeks ago for GI bleeding; he was transfused 2 units PRBC and had a negative CT (no further evaluation - this note is not available).  Today, Dr. Melina Copa sent him as a direct admission - he had blood work this AM and his Hgb was 6 again.    No abdominal pain or chest pain.  Takes iron, thought maybe this was turning stools dark, has persisted since last hospitalization.  No n/v.  +SOB with ambulation, improves with rest.  Denies fatigue - family disagrees.  Has been in/out of hospital since last August. A bit lightheaded when he stands up.    Assessment & Plan:   GI Bleed -Patient's has very mild symptoms of lightheadedness and fatigue - most likely caused by anemia secondary to upper GI bleeding.  -He has a h/o PUD requiring gastric surgery in the past. -His care in complicated by ongoing use of anticoagulants (see below). - will admit to med surg bed - GI consulted and had EGD and colonoscopy;  Has pandiverticulosis of colon. Capsule endoscopy study done 3/18 to be read 3/19 in AM per Dr. Laural Golden.  Continue Antiplatelet use in the setting of recent drug-eluting stent.  Continue clopidogrel.  -He is approximately 1-1/2 months outside of stent placement. Still an high risk for stent thrombosis.  -Taking ASA and Plavix (as well as Coumadin, see below). Holding warfarin for now.  I spoke with GI, ok to continue plavix. EGD when INR<2 -While there is the risk of stent thrombosis, he still cannot use dual antiplatelet therapy in the setting of  active bleeding - Hg trending up to 9.      Afib, on Coumadin -Unfortunately, the patent took his Coumadin before coming to the ER. Currently being held.  -Will follow PT/ INR.  INR is still >2.   -He is not actively bleeding at this time and Hg stable.  -He has an elevated stroke risk, CHA2DS2-VASc score is 7, with 9.6% stroke rate/year.  DM -Hold Glucophage -Cover with SSI  CBG (last 3)   Recent Labs  01/27/17 2139 01/28/17 0751 01/28/17 1036  GLUCAP 117* 160* 128*   OSA -Wears home O2 for COPD during the day, BIPAP at night -Will continue  CHF -Given Lasix between units, otherwise compensated  DVT prophylaxis:  SCDs Code Status: Full - confirmed with patient/family Family Communication: Wife and daughter present throughout evaluation Disposition Plan:  Home once clinically improved Consults called: GI    Subjective: Pt reports no melena or gross rectal bleeding.    Objective: Vitals:   01/28/17 1124 01/28/17 1125 01/28/17 1126 01/28/17 1200  BP:  126/68  (!) 151/68  Pulse: 70 70 74 78  Resp: '19 19 20 18  '$ Temp:    97.9 F (36.6 C)  TempSrc:    Oral  SpO2: 98% 97% 99% 97%  Weight:      Height:        Intake/Output Summary (Last 24 hours) at 01/28/17 1328 Last data filed at 01/28/17 0300  Gross per 24 hour  Intake             3650 ml  Output                0 ml  Net             3650 ml   Filed Weights   01/24/17 1811  Weight: 101.8 kg (224 lb 8 oz)    Exam:  General exam: awake, alert, NAD.  Respiratory system:  No increased work of breathing. Cardiovascular system: S1 & S2 heard.  Gastrointestinal system: Abdomen is nondistended, soft and nontender. Normal bowel sounds heard. Central nervous system: Alert and oriented. No focal neurological deficits. Extremities: no cyanosis.   Data Reviewed: Basic Metabolic Panel:  Recent Labs Lab 01/24/17 2200 01/25/17 0846 01/26/17 0416 01/27/17 0657  NA 137 138 140 141  K 2.9* 2.9* 3.2* 4.0   CL 99* 101 103 108  CO2 '30 29 30 28  '$ GLUCOSE 70 122* 130* 155*  BUN 22* '20 16 18  '$ CREATININE 1.47* 1.46* 1.31* 1.30*  CALCIUM 8.7* 8.5* 8.7* 8.4*  MG  --   --  2.1  --    Liver Function Tests: No results for input(s): AST, ALT, ALKPHOS, BILITOT, PROT, ALBUMIN in the last 168 hours. No results for input(s): LIPASE, AMYLASE in the last 168 hours. No results for input(s): AMMONIA in the last 168 hours. CBC:  Recent Labs Lab 01/24/17 1249 01/25/17 0846 01/26/17 0416 01/27/17 0657 01/27/17 1742 01/28/17 0703  WBC 11.8* 10.7* 11.2* 10.1  --  11.4*  NEUTROABS 8.8*  --   --   --   --   --   HGB 6.6* 8.4* 8.5* 7.6* 8.5* 9.6*  HCT 21.5* 26.0* 27.1* 24.7* 27.0* 30.9*  MCV 90.7 88.4 89.4 90.8  --  90.9  PLT 541* 506* 524* 492*  --  573*   Cardiac Enzymes: No results for input(s): CKTOTAL, CKMB, CKMBINDEX, TROPONINI in the last 168 hours. CBG (last 3)   Recent Labs  01/27/17 2139 01/28/17 0751 01/28/17 1036  GLUCAP 117* 160* 128*   No results found for this or any previous visit (from the past 240 hour(s)).   Studies: No results found.   Scheduled Meds: . meperidine      . midazolam      . amLODipine  5 mg Oral Daily  . clopidogrel  75 mg Oral Daily  . donepezil  10 mg Oral QHS  . insulin aspart  0-15 Units Subcutaneous TID WC  . metoprolol tartrate  25 mg Oral BID  . pantoprazole  40 mg Intravenous Q12H   Continuous Infusions: . sodium chloride 10 mL/hr at 01/27/17 1446  . lactated ringers 50 mL/hr at 01/28/17 1202    Principal Problem:   GI bleed Active Problems:   Diabetes mellitus without complication (HCC)   Paroxysmal atrial fibrillation (HCC)   Central sleep apnea   Long-term (current) use of anticoagulants   AAA (abdominal aortic aneurysm) ([place]) s/p repair in oct 2017 by dr Scot Dock   Chronic combined systolic and diastolic CHF (congestive heart failure) ([place])   Transfusion-dependent anemia  Time spent:   Irwin Brakeman, MD, FAAFP [facility] Pager [phone]  If 7PM-7AM, please contact night-coverage [email or web address] Password [email or web address] 01/28/2017, 1:28 PM    LOS: 4 days

## 2017-01-28 NOTE — Progress Notes (Signed)
Brief colonoscopy report.  Pancolonic diverticulosis. Most of the diverticula at ascending, hepatic flexure and sigmoid colon. No stigmata of GI bleed.  Will proceed with small bowel given capsule study. Can resume clopidogrel.

## 2017-01-28 NOTE — Op Note (Signed)
[facility] Patient Name: Rodney Orozco Procedure Date: 01/28/2017 9:50 AM MRN: [id number] Date of Birth: 10-01-1941 Attending MD: Hildred Laser , MD CSN: [id number] Age: 76 Admit Type: Inpatient Procedure:                Colonoscopy Indications:              Melena, Acute post hemorrhagic anemia Providers:                Hildred Laser, MD, Janeece Riggers, RN, Lurline Del, RN Referring MD:             [name], DO Medicines:                Meperidine 50 mg IV, Midazolam 5 mg IV Complications:            No immediate complications. Estimated Blood Loss:     Estimated blood loss: none. Procedure:                Pre-Anesthesia Assessment:                           - Prior to the procedure, a History and Physical                            was performed, and patient medications and                            allergies were reviewed. The patient's tolerance of                            previous anesthesia was also reviewed. The risks                            and benefits of the procedure and the sedation                            options and risks were discussed with the patient.                            All questions were answered, and informed consent                            was obtained. Prior Anticoagulants: The patient                            last took Coumadin (warfarin) 4 days and Plavix                            (clopidogrel) 4 days prior to the procedure. ASA                            Grade Assessment: III - A patient with severe                            systemic disease. After reviewing the risks and  benefits, the patient was deemed in satisfactory                            condition to undergo the procedure.                           After obtaining informed consent, the colonoscope                            was passed under direct vision. Throughout the                            procedure, the patient's blood pressure, pulse,  and                            oxygen saturations were monitored continuously. The                            [id number] ([id number]) scope was introduced through                            the anus and advanced to the the cecum, identified                            by appendiceal orifice and ileocecal valve. The                            colonoscopy was performed without difficulty. The                            patient tolerated the procedure well. The quality                            of the bowel preparation was excellent. The                            ileocecal valve, appendiceal orifice, and rectum                            were photographed. Scope In: 10:43:28 AM Scope Out: 11:08:39 AM Total Procedure Duration: 0 hours 25 minutes 11 seconds  Findings:      The perianal and digital rectal examinations were normal.      Multiple small and large-mouthed diverticula were found in the entire       colon.      The retroflexed view of the distal rectum and anal verge was normal and       showed no anal or rectal abnormalities. Impression:               - Diverticulosis in the entire examined colon.                           - No specimens collected.  Comment: Patient could have bled from right-sided                            diverticula given history of tarry stools.                           Will proceed with small bowel given capsule study                            to complete GI workup Moderate Sedation:      Moderate (conscious) sedation was administered by the endoscopy nurse       and supervised by the endoscopist. The following parameters were       monitored: oxygen saturation, heart rate, blood pressure, CO2       capnography and response to care. Total physician intraservice time was       32 minutes. Recommendation:           - Return patient to hospital ward for ongoing care.                           - Clear liquid diet for 2 hours.                            - Continue present medications.                           - Resume clopidogrel.                           - To visualize the small bowel, perform video                            capsule endoscopy today.                           - No repeat colonoscopy due to age and the absence                            of advanced adenomas. Procedure Code(s):        --- Professional ---                           [phone], Colonoscopy, flexible; diagnostic, including                            collection of specimen(s) by brushing or washing,                            when performed (separate procedure)                           [id number], Moderate sedation services provided by the                            same physician or other qualified health care  professional performing the diagnostic or                            therapeutic service that the sedation supports,                            requiring the presence of an independent trained                            observer to assist in the monitoring of the                            patient's level of consciousness and physiological                            status; initial 15 minutes of intraservice time,                            patient age 2 years or older                           [phone], Moderate sedation services; each additional                            15 minutes intraservice time Diagnosis Code(s):        --- Professional ---                           K92.1, Melena (includes Hematochezia)                           D62, Acute posthemorrhagic anemia                           K57.30, Diverticulosis of large intestine without                            perforation or abscess without bleeding CPT copyright 2016 [employer]. All rights reserved. The codes documented in this report are preliminary and upon coder review may  be revised to meet current compliance requirements. Hildred Laser, MD Hildred Laser, MD 01/28/2017 11:29:06 AM This report has been signed electronically. Number of Addenda: 0

## 2017-01-29 ENCOUNTER — Other Ambulatory Visit (INDEPENDENT_AMBULATORY_CARE_PROVIDER_SITE_OTHER): Payer: Self-pay | Admitting: *Deleted

## 2017-01-29 ENCOUNTER — Encounter (HOSPITAL_COMMUNITY): Payer: Self-pay | Admitting: Internal Medicine

## 2017-01-29 DIAGNOSIS — K921 Melena: Secondary | ICD-10-CM

## 2017-01-29 DIAGNOSIS — K5791 Diverticulosis of intestine, part unspecified, without perforation or abscess with bleeding: Secondary | ICD-10-CM

## 2017-01-29 DIAGNOSIS — K31819 Angiodysplasia of stomach and duodenum without bleeding: Secondary | ICD-10-CM

## 2017-01-29 DIAGNOSIS — D62 Acute posthemorrhagic anemia: Secondary | ICD-10-CM

## 2017-01-29 DIAGNOSIS — K922 Gastrointestinal hemorrhage, unspecified: Secondary | ICD-10-CM

## 2017-01-29 LAB — CBC
HEMATOCRIT: 27.3 % — AB (ref 39.0–52.0)
HEMOGLOBIN: 8.6 g/dL — AB (ref 13.0–17.0)
MCH: 28.3 pg (ref 26.0–34.0)
MCHC: 31.5 g/dL (ref 30.0–36.0)
MCV: 89.8 fL (ref 78.0–100.0)
Platelets: 436 10*3/uL — ABNORMAL HIGH (ref 150–400)
RBC: 3.04 MIL/uL — AB (ref 4.22–5.81)
RDW: 15 % (ref 11.5–15.5)
WBC: 9.4 10*3/uL (ref 4.0–10.5)

## 2017-01-29 LAB — BASIC METABOLIC PANEL
ANION GAP: 5 (ref 5–15)
BUN: 10 mg/dL (ref 6–20)
CHLORIDE: 106 mmol/L (ref 101–111)
CO2: 27 mmol/L (ref 22–32)
Calcium: 8.5 mg/dL — ABNORMAL LOW (ref 8.9–10.3)
Creatinine, Ser: 1.45 mg/dL — ABNORMAL HIGH (ref 0.61–1.24)
GFR, EST AFRICAN AMERICAN: 54 mL/min — AB (ref >60)
GFR, EST NON AFRICAN AMERICAN: 46 mL/min — AB (ref >60)
Glucose, Bld: 156 mg/dL — ABNORMAL HIGH (ref 65–99)
Potassium: 3.7 mmol/L (ref 3.5–5.1)
SODIUM: 138 mmol/L (ref 135–145)

## 2017-01-29 LAB — GLUCOSE, CAPILLARY: GLUCOSE-CAPILLARY: 144 mg/dL — AB (ref 65–99)

## 2017-01-29 MED ORDER — FUROSEMIDE 40 MG PO TABS: 40.0000 mg | ORAL_TABLET | Freq: Every day | ORAL | Status: AC

## 2017-01-29 MED ORDER — NOVOLIN N RELION 100 UNIT/ML ~~LOC~~ SUSP: 5.0000 [IU] | Freq: Two times a day (BID) | SUBCUTANEOUS | 11 refills | Status: AC

## 2017-01-29 NOTE — Progress Notes (Signed)
Patient discharged with instructions, prescription, and care notes.  Verbalized understanding via teach back.  IV was removed and the site was WNL. Patient voiced no further complaints or concerns at the time of discharge.  Appointments scheduled per instructions.  Patient left the floor via w/c family  And staff in stable condition. 

## 2017-01-29 NOTE — Op Note (Signed)
Small Bowel Givens Capsule Study Procedure date:  01/28/2017  Referring Provider:  Irwin Brakeman, MD PCP:  Dr. [name], DO  Indication for procedure:   Patient is 76 year old Caucasian male with recurrent GI bleed while on antiplatelet and anticoagulant. No bleeding lesion identified on EGD and colonoscopy. Patient swallowed given capsule yesterday.    Findings:  Patient was able to swallow given capsule without difficulty. Study duration 9 hours and 37 minutes. 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 Rapid transit of Given capsule through small bowel with small bowel transition time of 1 hour and 47 minutes. Single small nonbleeding AV malformation and mid small bowel otherwise normal study. Consider GI bleeding scan if bleed recurrent.  Comment: Suspect GI bleed from right colonic diverticulosis given history of melena.

## 2017-01-29 NOTE — Progress Notes (Signed)
Checked on patient about BiPAP, he stated mask wasn't correct obtained new mask. Went back at 11 pm he stated he wasn't ready for bed , went back at 12 20 , patient asleep. With oxygen on no BiPAP.

## 2017-01-29 NOTE — Discharge Summary (Signed)
Physician Discharge Summary  Rodney Orozco:[id number] DOB: 17-Oct-1941 DOA: 01/24/2017  PCP: [name], DO Cardiology: Dr. Gwenlyn Found Vascular: Dr. Scot Dock  Admit date: 01/24/2017 Discharge date: 01/29/2017  Admitted From: Home  Disposition:  Home   Recommendations for Outpatient Follow-up:  1. Follow up with PCP in 1 weeks 2. Please obtain BMP/CBC in one week  Discharge Condition: STaBLE CODE STATUS: FULL  Brief/Interim Summary: HPI: Rodney Orozco is a 76 y.o. male with medical history significant of AAA s/p repair in 10/17, h/o surgery for PUD, COPD, combined heart failure (EF 16%, grade 1 diastolic dysfunction 0/73/71), PAF, post-op PE in 2016, CVA on Warfarin, NSTEMI with DES x 2 on 12/06/16.  He reports having been hospitalizedabout 2 weeks ago for GI bleeding; he was transfused 2 units PRBC and had a negative CT (no further evaluation - this note is not available).  Today, Dr. Melina Copa sent him as a direct admission - he had blood work this AM and his Hgb was 6 again.    No abdominal pain or chest pain.  Takes iron, thought maybe this was turning stools dark, has persisted since last hospitalization.  No n/v.  +SOB with ambulation, improves with rest.  Denies fatigue - famioly disagrees.  Has been in/out of hospital since last August. A bit lightheaded when he stands up.    Assessment & Plan:   GI Bleed -Patient's has very mild symptoms of lightheadedness and fatigue - most likely caused by anemia secondary to upper GI bleeding.  -He has a h/o PUD requiring gastric surgery in the past. -His care in complicated by ongoing use of anticoagulants (see below). - GI consulted and had EGD and colonoscopy;  Has pandiverticulosis of colon. Capsule endoscopy study done 3/18  read 3/19 in AM per Dr. Laural Golden and showed 1 small non bleeding avm.  Continue Antiplatelet use in the setting of recent drug-eluting stent.  Continue clopidogrel.  -He is approximately 1-1/2 months outside of stent  placement. Still an high risk for stent thrombosis.  -Taking Plavix. Discontinued warfarin.  I spoke with GI, ok to continue plavix.  - Hg trending up to 9.  No futher bleeding reported.  Hg has been stable last couple of days.      Afib, on Coumadin -Patient/Family decided against taking coumadin any longer. Will discuss with Dr. Gwenlyn Found.   -He is not actively bleeding at this time and Hg stable.  -He has an elevated stroke risk, CHA2DS2-VASc score is 7, with 9.6% stroke rate/year.  Follow up with Dr. Gwenlyn Found in 2 weeks.  Follow up with vascular surgery asap.   DM -Hold Glucophage -Covered with SSI in hospital  CBG (last 3)   Recent Labs (last 2 labs)    Recent Labs  01/27/17 2139 01/28/17 0751 01/28/17 1036  GLUCAP 117* 160* 128*     OSA -Wears home O2 for COPD during the day, BIPAP at night -Will continue  CHF -Given Lasix between units, otherwise compensated, resume lasix 40 mg daily at discharge  DVT prophylaxis:SCDs Code Status:Full - confirmed with patient/family Family Communication:Wife and daughter Disposition Plan:Home once clinically improved Consults called:GI   Discharge Diagnoses:  Principal Problem:   GI bleed Active Problems:   Diabetes mellitus without complication (HCC)   Paroxysmal atrial fibrillation ([place])   Central sleep apnea   Long-term (current) use of anticoagulants   AAA (abdominal aortic aneurysm) ([place]) s/p repair in oct 2017 by dr Scot Dock   Chronic combined systolic and diastolic CHF (congestive  heart failure) ([place])   Transfusion-dependent anemia  Discharge Instructions  Discharge Instructions    Increase activity slowly    Complete by:  As directed      Allergies as of 01/29/2017      Reactions   Codeine Other (See Comments)   Hallucinations   Tramadol Dermatitis   Lipitor [atorvastatin] Other (See Comments)   Joint pain   Penicillins Rash   HIves/rash Has patient had a PCN reaction causing immediate rash,  facial/tongue/throat swelling, SOB or lightheadedness with hypotension: Yes Has patient had a PCN reaction causing severe rash involving mucus membranes or skin necrosis: No Has patient had a PCN reaction that required hospitalization No Has patient had a PCN reaction occurring within the last 10 years: No If all of the above answers are "NO", then may proceed with Cephalosporin use.      Medication List    STOP taking these medications   metFORMIN 500 MG tablet Commonly known as:  GLUCOPHAGE   warfarin 5 MG tablet Commonly known as:  COUMADIN     TAKE these medications   amLODipine 5 MG tablet Commonly known as:  NORVASC Take 1 tablet (5 mg total) by mouth daily.   clopidogrel 75 MG tablet Commonly known as:  PLAVIX Take 1 tablet (75 mg total) by mouth daily.   donepezil 10 MG tablet Commonly known as:  ARICEPT Take 1 tablet (10 mg total) by mouth at bedtime.   famotidine 20 MG tablet Commonly known as:  PEPCID Take 1 tablet by mouth 2 (two) times daily.   furosemide 40 MG tablet Commonly known as:  LASIX Take 1 tablet (40 mg total) by mouth daily. What changed:  when to take this   losartan 100 MG tablet Commonly known as:  COZAAR Take 100 mg by mouth daily.   metoprolol tartrate 25 MG tablet Commonly known as:  LOPRESSOR Take 1 tablet by mouth 2 (two) times daily.   nitroGLYCERIN 0.4 MG SL tablet Commonly known as:  NITROSTAT Place 1 tablet (0.4 mg total) under the tongue every 5 (five) minutes as needed for chest pain. MAX 3 dose   NOVOLIN N RELION 100 UNIT/ML injection Generic drug:  insulin NPH Human Inject 0.05 mLs (5 Units total) into the skin 2 (two) times daily. What changed:  how much to take   OXYGEN Inhale 2 L into the lungs at bedtime. 2lpm 24/7  DME- [facility]   VICTOZA 18 MG/3ML Sopn Generic drug:  liraglutide Inject 1.2 mg into the skin every morning.      Follow-up Information    Quay Burow, MD. Schedule an appointment as soon as  possible for a visit in 2 week(s).   Specialties:  Cardiology, Radiology Contact information: [address] [place] [address] [phone]        Deitra Mayo, MD. Schedule an appointment as soon as possible for a visit in 2 week(s).   Specialties:  Vascular Surgery, Cardiology Contact information: [place] [address] [place], DO. Schedule an appointment as soon as possible for a visit in 1 week(s).   Contact information: [address] [place] [address] [phone]          Allergies  Allergen Reactions  . Codeine Other (See Comments)    Hallucinations  . Tramadol Dermatitis  . Lipitor [Atorvastatin] Other (See Comments)    Joint pain  . Penicillins Rash  HIves/rash Has patient had a PCN reaction causing immediate rash, facial/tongue/throat swelling, SOB or lightheadedness with hypotension: Yes Has patient had a PCN reaction causing severe rash involving mucus membranes or skin necrosis: No Has patient had a PCN reaction that required hospitalization No Has patient had a PCN reaction occurring within the last 10 years: No If all of the above answers are "NO", then may proceed with Cephalosporin use.     Procedures/Studies: Ct Angio Abd/pel W/ And/or W/o  Result Date: 01/12/2017 CLINICAL DATA:  Post endovascular repair of abdominal aortic aneurysm. EXAM: CTA ABDOMEN AND PELVIS WITH CONTRAST TECHNIQUE: Multidetector CT imaging of the abdomen and pelvis was performed using the standard protocol during bolus administration of intravenous contrast. Multiplanar reconstructed images and MIPs were obtained and reviewed to evaluate the vascular anatomy. CONTRAST:  One 0 cc Isovue 370 COMPARISON:  CT abdomen pelvis - 10/11/2016; 08/15/2016 FINDINGS: VASCULAR Aorta: Stable sequela of endovascular repair of infrarenal abdominal aortic aneurysm. The stent graft appears widely patent. The proximal and  distal ends of the stent graft are well apposed against the walls of the infrarenal abdominal aorta as well as the bilateral common iliac arteries. The caliber of the native bilobed infrarenal abdominal aortic aneurysm is unchanged to slightly decreased in size with dominant cranial component measuring 5.5 x 5.9 x 5.7 cm (as measured in greatest short axis oblique axial - image 77, series 4, coronal image 76, series 7 and sagittal image 125, series 8 images respectively), and dominant caudal component measuring approximately 5.0 x 4.7 x 5.2 cm (as measured in greatest oblique short axis axial - image 93, series 4; coronal image 70, series 7 and sagittal - image 120, series 8, dimensions respectively), previously 5.9 x 5.8 x 5.8 and 5.4 x 5.4 x 5.4 cm when compared to the 08/2016 examination. Evaluation for endoleak is limited secondary to lack of delayed phase images. No perivascular stranding. Celiac: Minimal amount of eccentric mixed calcified and noncalcified atherosclerotic plaque involving the origin of the celiac artery, not resulting in a hemodynamically significant stenosis. Conventional branching pattern. SMA: There is a minimal amount of eccentric mixed calcified and noncalcified atherosclerotic plaque involving the origin the SMA, not resulting in hemodynamically significant stenosis. Conventional branching pattern. The distal tributaries the SMA are widely patent without discrete intraluminal filling defect to suggest distal embolism. Renals: Solitary bilaterally. There is a minimal amount of eccentric mixed calcified and noncalcified atherosclerotic plaque involving the origin of the bilateral renal artery's, not resulting in hemodynamically significant stenosis. No vessel irregularity to suggest FMD. IMA: Occluded at its origin with early reconstitution via collateral supply from the SMA. Inflow: As above, the distal limbs of the aortic stent graft are well apposed against the walls of the bilateral  common iliac arteries. The bilateral internal iliac arteries are diseased though patent. Unchanged mild fusiform ectasia of the left internal iliac artery measuring 1.4 cm in diameter (image 126, series 4, previously, unchanged. The bilateral external iliac arteries are mildly diseased and tortuous though without hemodynamically significant stenosis. Proximal Outflow: Moderate amount of eccentric mixed calcified and noncalcified atherosclerotic plaque within the bilateral common femoral artery is, not resulting in hemodynamically significant stenosis. Veins: The IVC and pelvic venous system appears widely patent on this non CTA examination. Review of the MIP images confirms the above findings. NON-VASCULAR Evaluation of the abdominal organs is limited to the arterial phase of enhancement. Lower chest: Limited visualization of the lower thorax demonstrates pleural calcifications and architectural distortion/volume loss involving the  imaged portion of the right lower lobe. Minimal subsegmental atelectasis within the inferior segment of the lingula. No focal airspace opacities. No pleural effusion. Borderline cardiomegaly. Coronary artery calcifications. No pericardial effusion. Hepatobiliary: Normal hepatic contour. No discrete hyperenhancing hepatic lesions. Layering radiopaque gallstones within an under distended gallbladder. No intra extrahepatic bili duct dilatation. No ascites. Pancreas: Normal appearance of the pancreas. Spleen: Normal appearance of the spleen. Note is made of 2 small splenules about the anterior aspect of the spleen. Adrenals/Urinary Tract: There is symmetric enhancement of the bilateral kidneys. Multiple renal cysts are again seen bilaterally with dominant exophytic cyst arises from the anterior inferior pole of the left kidney measuring 3.7 cm in diameter (image 79, series 4). Additional bilateral subcentimeter hypoattenuating renal lesions are too small likely characterize of favored to  represent additional renal cysts. No definite renal stones this postcontrast examination. No urinary obstruction or perinephric stranding. Normal appearance of the bilateral adrenal glands. Normal appearance of the urinary bladder given underdistention. Stomach/Bowel: Moderate colonic stool burden without evidence of enteric obstruction. Scattered colonic diverticulosis without evidence of diverticulitis. The bowel is normal in course and caliber without wall thickening or evidence of enteric obstruction. No pneumoperitoneum, pneumatosis or portal venous gas. Lymphatic: Scattered retroperitoneal and pelvic lymph nodes are numerous though individually not enlarged by size criteria with index aortocaval lymph node measuring 0.9 cm in greatest short axis diameter (image 65, series 4), index left sided periaortic lymph node measuring 0.8 cm (image 86, series 4) and index right external iliac chain lymph node measuring 0.8 cm in greatest short axis diameter (image 146, series 4). Index left-sided inguinal lymph node measures 1.7 cm in greatest short axis diameter (image 173, series 4) though maintains a benign fatty hilum. All lymph nodes appear similar to the 08/2016 examination and presumably reactive in etiology. , similar to the 09/2016 examination and presumably reactive in etiology. Reproductive: Normal appearance of the prostate gland. No free fluid in the pelvic cul-de-sac. Other: Small bilateral mesenteric fat containing inguinal hernias, left greater than right. Musculoskeletal: No acute or aggressive osseous abnormalities. Moderate severe multilevel lumbar spine DDD, worse at T12-L1, L2-L3 and L4-L5 with disc space height loss, endplate irregularity and sclerosis. IMPRESSION: VASCULAR 1. Post endovascular repair of infrarenal abdominal aortic aneurysm. No evidence of endoleak, however examination is degraded secondary to lack of precontrast and delayed phase images. The caliber of the bilobed infrarenal  abdominal aortic aneurysm is unchanged to minimally decreased in size compared to the preoperative examination performed 08/2016, currently measuring 5.9 cm in maximal diameter. 2.  Aortic Atherosclerosis (ICD10-170.0) 3.  Aortic aneurysm NOS (ICD10-I71.9) NON-VASCULAR 1. Stable examination of the abdomen and pelvis. Electronically Signed   By: Sandi Mariscal M.D.   On: 01/12/2017 09:32    (Echo, Carotid, EGD, Colonoscopy, ERCP)    Subjective: Pt feels much better.  No further bleeding. No black stools.  No BRBPR.   Discharge Exam: Vitals:   01/28/17 2224 01/29/17 0637  BP: (!) 138/56 (!) 135/50  Pulse: 86 85  Resp:  14  Temp:  97.9 F (36.6 C)   Vitals:   01/28/17 1344 01/28/17 1635 01/28/17 2224 01/29/17 0637  BP: (!) 151/75 (!) 145/71 (!) 138/56 (!) 135/50  Pulse: 68 60 86 85  Resp:  18  14  Temp:  97.9 F (36.6 C)  97.9 F (36.6 C)  TempSrc:  Oral  Oral  SpO2:  97%  100%  Weight:      Height:  General exam: awake, alert, NAD.  Respiratory system:  No increased work of breathing. Cardiovascular system: S1 & S2 heard.  Gastrointestinal system: Abdomen is nondistended, soft and nontender. Normal bowel sounds heard. Central nervous system: Alert and oriented. No focal neurological deficits. Extremities: no cyanosis.    The results of significant diagnostics from this hospitalization (including imaging, microbiology, ancillary and laboratory) are listed below for reference.     Microbiology: No results found for this or any previous visit (from the past 240 hour(s)).   Labs: BNP (last 3 results) No results for input(s): BNP in the last 8760 hours. Basic Metabolic Panel:  Recent Labs Lab 01/24/17 2200 01/25/17 0846 01/26/17 0416 01/27/17 0657 01/29/17 0816  NA 137 138 140 141 138  K 2.9* 2.9* 3.2* 4.0 3.7  CL 99* 101 103 108 106  CO2 '30 29 30 28 27  '$ GLUCOSE 70 122* 130* 155* 156*  BUN 22* '20 16 18 10  '$ CREATININE 1.47* 1.46* 1.31* 1.30* 1.45*  CALCIUM  8.7* 8.5* 8.7* 8.4* 8.5*  MG  --   --  2.1  --   --    Liver Function Tests: No results for input(s): AST, ALT, ALKPHOS, BILITOT, PROT, ALBUMIN in the last 168 hours. No results for input(s): LIPASE, AMYLASE in the last 168 hours. No results for input(s): AMMONIA in the last 168 hours. CBC:  Recent Labs Lab 01/24/17 1249 01/25/17 0846 01/26/17 0416 01/27/17 0657 01/27/17 1742 01/28/17 0703 01/29/17 0816  WBC 11.8* 10.7* 11.2* 10.1  --  11.4* 9.4  NEUTROABS 8.8*  --   --   --   --   --   --   HGB 6.6* 8.4* 8.5* 7.6* 8.5* 9.6* 8.6*  HCT 21.5* 26.0* 27.1* 24.7* 27.0* 30.9* 27.3*  MCV 90.7 88.4 89.4 90.8  --  90.9 89.8  PLT 541* 506* 524* 492*  --  573* 436*   Cardiac Enzymes: No results for input(s): CKTOTAL, CKMB, CKMBINDEX, TROPONINI in the last 168 hours. BNP: Invalid input(s): POCBNP CBG:  Recent Labs Lab 01/28/17 1036 01/28/17 1344 01/28/17 1633 01/28/17 2230 01/29/17 0803  GLUCAP 128* 98 126* 198* 144*   D-Dimer No results for input(s): DDIMER in the last 72 hours. Hgb A1c No results for input(s): HGBA1C in the last 72 hours. Lipid Profile No results for input(s): CHOL, HDL, LDLCALC, TRIG, CHOLHDL, LDLDIRECT in the last 72 hours. Thyroid function studies No results for input(s): TSH, T4TOTAL, T3FREE, THYROIDAB in the last 72 hours.  Invalid input(s): FREET3 Anemia work up No results for input(s): VITAMINB12, FOLATE, FERRITIN, TIBC, IRON, RETICCTPCT in the last 72 hours. Urinalysis    Component Value Date/Time   COLORURINE YELLOW 08/29/2016 [place] 08/29/2016 1333   LABSPEC 1.006 08/29/2016 1333   PHURINE 6.5 08/29/2016 1333   GLUCOSEU NEGATIVE 08/29/2016 1333   [place] 08/29/2016 [place] 08/29/2016 [place] 08/29/2016 1333   PROTEINUR NEGATIVE 08/29/2016 1333   NITRITE NEGATIVE 08/29/2016 1333   LEUKOCYTESUR NEGATIVE 08/29/2016 1333   Sepsis Labs Invalid input(s): PROCALCITONIN,  WBC,   LACTICIDVEN Microbiology No results found for this or any previous visit (from the past 240 hour(s)).  Time coordinating discharge: 32 minutes  SIGNED:  Irwin Brakeman, MD  [facility] 01/29/2017, 10:28 AM Pager   If 7PM-7AM, please contact night-coverage [email or web address] Password [email or web address]

## 2017-01-29 NOTE — Care Management Important Message (Signed)
Important Message  Patient Details  Name: Rodney Orozco MRN: [id number] Date of Birth: May 10, 1941   Medicare Important Message Given:  Yes    Sherald Barge, RN 01/29/2017, 11:27 AM

## 2017-01-29 NOTE — Progress Notes (Signed)
Patient has no complaints. He denies rest pain, shortness of breath, melena or rectal bleeding. He also denies abdominal pain nausea or vomiting. Abdominal exam is within normal limits. Hemoglobin is 8.6 BUN down to 10. Small bowel study revealed single nonbleeding AV formation without bleeding. Please see separate report.  Assessment:  GI bleed possibly secondary right-sided colonic diverticula. Patient is back on clopidogrel. He has decided not to go back on warfarin. Patient stable for discharge from GI standpoint. Will have H&H checked in 02/02/2017. Patient advised to report to emergency room if he has frank bleed in which case we'll proceed with GI bleeding scan.

## 2017-01-29 NOTE — Care Management Note (Signed)
Case Management Note  Patient Details  Name: Rodney Orozco MRN: [id number] Date of Birth: 05-17-1941  Expected Discharge Date:  01/29/17               Expected Discharge Plan:  Home/Self Care  In-House Referral:  NA  Discharge planning Services  CM Consult  Post Acute Care Choice:  NA Choice offered to:  NA  Status of Service:  Completed, signed off  Additional Comments: Pt discharging home today with self care. No HH/DME or med needs.   Sherald Barge, RN 01/29/2017, 11:28 AM

## 2017-02-02 LAB — HEMOGLOBIN AND HEMATOCRIT, BLOOD
HCT: 31.1 % — ABNORMAL LOW (ref 38.5–50.0)
Hemoglobin: 9.5 g/dL — ABNORMAL LOW (ref 13.2–17.1)

## 2017-02-05 ENCOUNTER — Encounter (INDEPENDENT_AMBULATORY_CARE_PROVIDER_SITE_OTHER): Payer: Self-pay | Admitting: *Deleted

## 2017-02-05 ENCOUNTER — Other Ambulatory Visit (INDEPENDENT_AMBULATORY_CARE_PROVIDER_SITE_OTHER): Payer: Self-pay | Admitting: *Deleted

## 2017-02-05 DIAGNOSIS — K922 Gastrointestinal hemorrhage, unspecified: Secondary | ICD-10-CM

## 2017-02-08 ENCOUNTER — Ambulatory Visit (INDEPENDENT_AMBULATORY_CARE_PROVIDER_SITE_OTHER): Payer: Medicare HMO | Admitting: Cardiology

## 2017-02-08 ENCOUNTER — Encounter: Payer: Self-pay | Admitting: Cardiology

## 2017-02-08 VITALS — BP 140/80 | HR 75 | Ht 70.5 in | Wt 220.0 lb

## 2017-02-08 DIAGNOSIS — Z9861 Coronary angioplasty status: Secondary | ICD-10-CM

## 2017-02-08 DIAGNOSIS — I2510 Atherosclerotic heart disease of native coronary artery without angina pectoris: Secondary | ICD-10-CM | POA: Diagnosis not present

## 2017-02-08 DIAGNOSIS — I255 Ischemic cardiomyopathy: Secondary | ICD-10-CM | POA: Diagnosis not present

## 2017-02-08 DIAGNOSIS — R0989 Other specified symptoms and signs involving the circulatory and respiratory systems: Secondary | ICD-10-CM

## 2017-02-08 DIAGNOSIS — K31811 Angiodysplasia of stomach and duodenum with bleeding: Secondary | ICD-10-CM | POA: Diagnosis not present

## 2017-02-08 DIAGNOSIS — I480 Paroxysmal atrial fibrillation: Secondary | ICD-10-CM | POA: Diagnosis not present

## 2017-02-08 DIAGNOSIS — Z7901 Long term (current) use of anticoagulants: Secondary | ICD-10-CM | POA: Diagnosis not present

## 2017-02-08 MED ORDER — ASPIRIN EC 81 MG PO TBEC: 81.0000 mg | DELAYED_RELEASE_TABLET | Freq: Every day | ORAL | 3 refills | Status: AC

## 2017-02-08 NOTE — Assessment & Plan Note (Signed)
Coumadin stopped March 2018 after GI bleed

## 2017-02-08 NOTE — Patient Instructions (Addendum)
Medication Instructions:  START Aspirin '81mg'$  Take 1 tablet once a day  Labwork: None   Testing/Procedures: Your physician has requested that you have a carotid duplex. This test is an ultrasound of the carotid arteries in your neck. It looks at blood flow through these arteries that supply the brain with blood. Allow one hour for this exam. There are no restrictions or special instructions.  Follow-Up: Your physician recommends that you schedule a follow-up appointment in: [place]  Any Other Special Instructions Will Be Listed Below (If Applicable).  If you need a refill on your cardiac medications before your next appointment, please call your pharmacy.

## 2017-02-08 NOTE — Progress Notes (Signed)
02/08/2017 Rodney Orozco   06-Jun-1941  [id number]  Primary Physician [name], DO Primary Cardiologist: Dr Gwenlyn Found  HPI:  76 y/o male from [place] with a history of several medical problems. He has PVD and had an aortic stent graft placed in Oct 2017. He has PAF and is a CHADs2 Vasc 7. He was placed on Coumadin Rx after he had a cath and subsequent multi vessel PCI with DES 12/05/16. He went home on ASA 81 mg, Plavix, Coumadin- the ASA was dropped after one month.   He was admitted 01/24/17 to [facility] with GI bleeding, probably diverticular or from a SB AVM. Coumadin was stopped and he was discharged on Plavix alone. He is in the office today for follow up. He has had no further bleeding and denies chest pain.    Current Outpatient Prescriptions  Medication Sig Dispense Refill  . amLODipine (NORVASC) 5 MG tablet Take 1 tablet (5 mg total) by mouth daily. 30 tablet 12  . clopidogrel (PLAVIX) 75 MG tablet Take 1 tablet (75 mg total) by mouth daily. 30 tablet 12  . donepezil (ARICEPT) 10 MG tablet Take 1 tablet (10 mg total) by mouth at bedtime. 30 tablet 12  . famotidine (PEPCID) 20 MG tablet Take 1 tablet by mouth 2 (two) times daily.    . furosemide (LASIX) 40 MG tablet Take 1 tablet (40 mg total) by mouth daily. 30 tablet   . losartan (COZAAR) 100 MG tablet Take 100 mg by mouth daily.    . metoprolol tartrate (LOPRESSOR) 25 MG tablet Take 1 tablet by mouth 2 (two) times daily.    . nitroGLYCERIN (NITROSTAT) 0.4 MG SL tablet Place 1 tablet (0.4 mg total) under the tongue every 5 (five) minutes as needed for chest pain. MAX 3 dose 25 tablet 3  . NOVOLIN N RELION 100 UNIT/ML injection Inject 0.05 mLs (5 Units total) into the skin 2 (two) times daily. 10 mL 11  . OXYGEN Inhale 2 L into the lungs at bedtime. 2lpm 24/7  DME- [employer]     . VICTOZA 18 MG/3ML SOPN Inject 1.2 mg into the skin every morning.      No current facility-administered medications for this visit.     Allergies    Allergen Reactions  . Codeine Other (See Comments)    Hallucinations  . Tramadol Dermatitis  . Lipitor [Atorvastatin] Other (See Comments)    Joint pain  . Penicillins Rash    HIves/rash Has patient had a PCN reaction causing immediate rash, facial/tongue/throat swelling, SOB or lightheadedness with hypotension: Yes Has patient had a PCN reaction causing severe rash involving mucus membranes or skin necrosis: No Has patient had a PCN reaction that required hospitalization No Has patient had a PCN reaction occurring within the last 10 years: No If all of the above answers are "NO", then may proceed with Cephalosporin use.     Past Medical History:  Diagnosis Date  . AAA (abdominal aortic aneurysm) ([place]) 2017  . Asthma   . Cardiomyopathy ([place])   . Dementia   . Dyslipidemia   . HCAP (healthcare-associated pneumonia) 12/2013   01/06/2014  . Hypertension   . Mixed hyperlipidemia   . On home oxygen therapy    2L during the day  . OSA on CPAP    actually BIPAP  . Osteoarthritis   . Paroxysmal atrial fibrillation (HCC)    takes Warfarin  . Pneumonia December 2014   Had hemoptysis and admitted at [facility].  .  Pulmonary embolism ([place]) 12/2014   S/P knee replacement/notes 08/31/2016  . TIA (transient ischemic attack)   . Type II diabetes mellitus ([place])   . Vitamin D deficiency     Social History   Social History  . Marital status: Married    Spouse name: [name]  . Number of children: 3  . Years of education: 56   Occupational History  . Retired    Social History Main Topics  . Smoking status: Former Smoker    Packs/day: 2.00    Years: 50.00    Types: Cigarettes    Quit date: 11/07/2013  . Smokeless tobacco: Never Used  . Alcohol use No  . Drug use: No  . Sexual activity: Not on file   Other Topics Concern  . Not on file   Social History Narrative   Lives with wife   Caffeine use: 4 cups /day (coffee)     Family History  Problem Relation Age of  Onset  . Dementia Mother   . Stroke Mother   . Heart attack Father      Review of Systems: General: negative for chills, fever, night sweats or weight changes.  Cardiovascular: negative for chest pain, dyspnea on exertion, edema, orthopnea, palpitations, paroxysmal nocturnal dyspnea or shortness of breath Dermatological: negative for rash Respiratory: negative for cough or wheezing Urologic: negative for hematuria Abdominal: negative for nausea, vomiting, diarrhea, bright red blood per rectum, melena, or hematemesis Neurologic: negative for visual changes, syncope, or dizziness All other systems reviewed and are otherwise negative except as noted above.    Blood pressure 140/80, pulse 75, height 5' 10.5" (1.791 m), weight 220 lb (99.8 kg).  General appearance: alert, cooperative, no distress and mildly obese Neck: no JVD and Lt CA bruit Lungs: decreased breath sounds c/w COPD Heart: regular rate and rhythm Extremities: no edema Skin: Skin color, texture, turgor normal. No rashes or lesions Neurologic: Grossly normal  EKG NSR, LVH  ASSESSMENT AND PLAN:   GI bleed Pt admitted March 2018 with life threatening GI bleed  Long-term (current) use of anticoagulants Coumadin stopped March 2018 after GI bleed  CAD S/P percutaneous coronary angioplasty PL DES, RCA DES 12/05/16. Residual 80% small CFX and 60-70% PDA- medical Rx.  Cardiomyopathy ([place]) EF 45% by echo Jan 2018  Paroxysmal atrial fibrillation [facility]) CHADs 2 VASc-7   PLAN  Discussed with Dr Oval Linsey- resume ASA 81 mg. I ordered carotid dopplers. Keep f/u with Dr Gwenlyn Found in April.   Kerin Ransom PA-C 02/08/2017 10:41 AM

## 2017-02-08 NOTE — Assessment & Plan Note (Signed)
Pt admitted March 2018 with life threatening GI bleed

## 2017-02-08 NOTE — Assessment & Plan Note (Signed)
CHADs 2 VASc-7

## 2017-02-08 NOTE — Assessment & Plan Note (Signed)
EF 45% by echo Jan 2018

## 2017-02-08 NOTE — Assessment & Plan Note (Signed)
PL DES, RCA DES 12/05/16. Residual 80% small CFX and 60-70% PDA- medical Rx.

## 2017-02-14 ENCOUNTER — Ambulatory Visit (HOSPITAL_COMMUNITY)
Admission: RE | Admit: 2017-02-14 | Discharge: 2017-02-14 | Disposition: A | Discharge status: Home / Self Care | Payer: Medicare HMO | Source: Ambulatory Visit | Attending: Cardiology | Admitting: Cardiology

## 2017-02-14 DIAGNOSIS — I10 Essential (primary) hypertension: Secondary | ICD-10-CM | POA: Insufficient documentation

## 2017-02-14 DIAGNOSIS — R0989 Other specified symptoms and signs involving the circulatory and respiratory systems: Secondary | ICD-10-CM

## 2017-02-14 DIAGNOSIS — Z87891 Personal history of nicotine dependence: Secondary | ICD-10-CM | POA: Insufficient documentation

## 2017-02-14 DIAGNOSIS — I6523 Occlusion and stenosis of bilateral carotid arteries: Secondary | ICD-10-CM | POA: Insufficient documentation

## 2017-02-14 DIAGNOSIS — E119 Type 2 diabetes mellitus without complications: Secondary | ICD-10-CM | POA: Diagnosis not present

## 2017-02-14 DIAGNOSIS — E785 Hyperlipidemia, unspecified: Secondary | ICD-10-CM | POA: Diagnosis not present

## 2017-02-14 DIAGNOSIS — J449 Chronic obstructive pulmonary disease, unspecified: Secondary | ICD-10-CM | POA: Insufficient documentation

## 2017-02-14 NOTE — Progress Notes (Unsigned)
Patient ID: Rodney Orozco, male   DOB: [birth year], 76 y.o.   MRN: [id number] C Carotid duplex exam today revealed LICA velocities of 093/235 cm/s. Patient insisted that he was asymptomatic. Dr Martinique (DOD) was informed and instructed that the patient is to be released as long as he was asymptomatic. Scheduling to get patient in to see Dr Gwenlyn Found asap. Dr Gwenlyn Found and [name] informed  through [employer].

## 2017-02-15 ENCOUNTER — Telehealth: Payer: Self-pay | Admitting: Cardiovascular Disease

## 2017-02-15 NOTE — Telephone Encounter (Signed)
Patients wife called back to verify husbands appt with Dr. Gwenlyn Found on 4-18 at 11:40.

## 2017-02-20 LAB — HEMOGLOBIN AND HEMATOCRIT, BLOOD
HCT: 31.3 % — ABNORMAL LOW (ref 38.5–50.0)
HEMOGLOBIN: 9.4 g/dL — AB (ref 13.2–17.1)

## 2017-02-22 ENCOUNTER — Other Ambulatory Visit (INDEPENDENT_AMBULATORY_CARE_PROVIDER_SITE_OTHER): Payer: Self-pay | Admitting: *Deleted

## 2017-02-22 ENCOUNTER — Encounter (INDEPENDENT_AMBULATORY_CARE_PROVIDER_SITE_OTHER): Payer: Self-pay | Admitting: *Deleted

## 2017-02-22 DIAGNOSIS — K922 Gastrointestinal hemorrhage, unspecified: Secondary | ICD-10-CM

## 2017-02-26 ENCOUNTER — Encounter (INDEPENDENT_AMBULATORY_CARE_PROVIDER_SITE_OTHER): Payer: Self-pay | Admitting: Internal Medicine

## 2017-02-26 NOTE — Progress Notes (Signed)
Patient was given an appointment for 03/13/17 at 10:30am.  A letter was mailed to the patient.

## 2017-02-28 ENCOUNTER — Encounter: Payer: Self-pay | Admitting: Cardiovascular Disease

## 2017-02-28 ENCOUNTER — Ambulatory Visit (INDEPENDENT_AMBULATORY_CARE_PROVIDER_SITE_OTHER): Payer: Medicare HMO | Admitting: Cardiovascular Disease

## 2017-02-28 ENCOUNTER — Other Ambulatory Visit: Payer: Self-pay

## 2017-02-28 DIAGNOSIS — I714 Abdominal aortic aneurysm, without rupture, unspecified: Secondary | ICD-10-CM

## 2017-02-28 DIAGNOSIS — I739 Peripheral vascular disease, unspecified: Principal | ICD-10-CM

## 2017-02-28 DIAGNOSIS — I10 Essential (primary) hypertension: Secondary | ICD-10-CM

## 2017-02-28 DIAGNOSIS — I519 Heart disease, unspecified: Secondary | ICD-10-CM

## 2017-02-28 DIAGNOSIS — I5041 Acute combined systolic (congestive) and diastolic (congestive) heart failure: Secondary | ICD-10-CM

## 2017-02-28 DIAGNOSIS — I480 Paroxysmal atrial fibrillation: Secondary | ICD-10-CM | POA: Diagnosis not present

## 2017-02-28 DIAGNOSIS — I779 Disorder of arteries and arterioles, unspecified: Secondary | ICD-10-CM

## 2017-02-28 DIAGNOSIS — I2510 Atherosclerotic heart disease of native coronary artery without angina pectoris: Secondary | ICD-10-CM

## 2017-02-28 DIAGNOSIS — E785 Hyperlipidemia, unspecified: Secondary | ICD-10-CM

## 2017-02-28 DIAGNOSIS — I2699 Other pulmonary embolism without acute cor pulmonale: Secondary | ICD-10-CM | POA: Diagnosis not present

## 2017-02-28 DIAGNOSIS — Z9861 Coronary angioplasty status: Secondary | ICD-10-CM

## 2017-02-28 NOTE — Assessment & Plan Note (Signed)
History of a 5.7 cm abdominal aortic aneurysm status post endoluminal stent grafting by Drs. Scot Dock and Chen 08/22/16.

## 2017-02-28 NOTE — Assessment & Plan Note (Signed)
History of pulmonary embolus in the past on Coumadin anticoagulation. The Coumadin has been discontinued because of GI bleed

## 2017-02-28 NOTE — Assessment & Plan Note (Signed)
History of GI bleeding in the recent past on aspirin, Plavix and Coumadin. He did require 4 units of packed red blood cell transfusions and underwent endoscopy and colonoscopy without documentation of a bleeding source. He was thought to have a diverticular or a small bowel AVM bleed. As result. He is no longer on Coumadin.

## 2017-02-28 NOTE — Assessment & Plan Note (Signed)
History of CAD status post acute coronary syndrome, PCI and drug-eluting stenting by Dr. Irish Lack 12/05/16. He had stenting of his mid RCA and his posterior lateral branch with Medtronic Onyx drug-eluting stents. He had residual mid AV groove circumflex and mid PDA disease which were treated medically. He denies chest pain. He will need dual antiplatelet therapy uninterrupted for 12 months.

## 2017-02-28 NOTE — Assessment & Plan Note (Signed)
History of paroxysmal atrial fibrillation maintaining sinus rhythm currently not on oral anticoagulant because of GI bleed.

## 2017-02-28 NOTE — Assessment & Plan Note (Signed)
History of mild LV dysfunction with an EF in the 45% range. He is on appropriate medications.

## 2017-02-28 NOTE — Assessment & Plan Note (Signed)
History of hypertension with blood pressure measured today at 168/84. He is on amlodipine and metoprolol as well as losartan. Continue current meds at current dosing

## 2017-02-28 NOTE — Assessment & Plan Note (Signed)
History of dyslipidemia not on statin therapy due to statin intolerance followed by his PCP

## 2017-02-28 NOTE — Patient Instructions (Signed)
Medication Instructions: Your physician recommends that you continue on your current medications as directed. Please refer to the Current Medication list given to you today.   Follow-Up: Your physician recommends that you schedule a follow-up appointment with Dr. Scot Dock at [facility] for evaluation of Left Carotid Endartarectomy.  Your physician wants you to follow-up in: 6 months with Dr. Gwenlyn Found. You will receive a reminder letter in the mail two months in advance. If you don't receive a letter, please call our office to schedule the follow-up appointment.  If you need a refill on your cardiac medications before your next appointment, please call your pharmacy.

## 2017-02-28 NOTE — Assessment & Plan Note (Signed)
History of combined systolic and diastolic heart failure and EF of 45% and grade 1 diastolic dysfunction. He is on appropriate medications.

## 2017-02-28 NOTE — Assessment & Plan Note (Signed)
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 His left ICA is in the 80-90% range. He is neurologically asymptomatic on aspirin and Plavix. He will need revascularization. I'm referring him back to Dr. Scot Dock for a dilation of endarterectomy versus carotid artery stenting. He cannot stop his dual antiplatelet therapy until January 2019. Otherwise, he is acceptable risk for endarterectomy with mild to moderate LV dysfunction.

## 2017-02-28 NOTE — Progress Notes (Signed)
02/28/2017 Rodney Orozco   02/27/1941  [id number]  Primary Physician [name], DO Primary Cardiologist: Lorretta Harp MD Renae Gloss  HPI:  Rodney Orozco is a 76 year old mildly overweight married Caucasian male father of 46, grandfather benign great-grandchildren was accompanied by his wife Rodney Orozco. He was referred by his PCP, Dr. [name], for cardiovascular evaluation. I last saw him in the office 07/12/16. He has greater than 100-pack-year history of tobacco abuse having smoked 2 packs a day for 50 years and stopped 5 years ago. In addition, history of hypertension, diabetes and hyperlipidemia intolerant to statin therapy. He does have a family history of heart disease with a brother who died of a myocardial infarction at 74 years old although he has never had a heart attack. He has had TIAs in the past. He complains of chronic shortness of breath. He does have a 5.7 cm abdominal aortic aneurysm recently measured by abdominal CT scan earlier this month. He has a remote pulmonary and was in February of last year after a right total knee replacement. He was on Coumadin anticoagulation for 6 months after that. He was recently hospitalized in [facility] for COPD exacerbation with concomitant acute systolic and diastolic heart failure. He was in the ICU for 5 days. He was diuresed and his COPD exacerbation was apparently treated. He was in A. fib with RVR and converted to normal sinus rhythm. He was placed on Coumadin anticoagulation. A 2-D echo revealed an ejection fraction of 40-45% with moderate RV dysfunction. He underwent endoluminal stent grafting of his infrarenal abdominal aortic aneurysm by Drs. Bridgett Larsson and Scot Dock 08/22/16 with an excellent result. He was admitted to the hospital in early January with acute coronary syndrome and underwent cardiac catheterization by Dr. Illene Bolus 12/03/16 with implantation of 2 Medtronic Onyx  drug-eluting stents in his mid RCA and posterior lateral branch. He did have residual mid AV groove circumflex and mid PDA stenosis treated medically. He had GI bleeding twice after that requiring transfusion of a total of 4 units of packed red blood cells. His Coumadin was discontinued. He underwent upper and lower endoscopy by Dr. Laural Golden without identification of a bleeding source. He recently had carotid Dopplers performed 02/14/17 which showed moderate right and moderate to severe left ICA stenosis.   Current Outpatient Prescriptions  Medication Sig Dispense Refill  . amLODipine (NORVASC) 5 MG tablet Take 1 tablet (5 mg total) by mouth daily. 30 tablet 12  . aspirin EC 81 MG tablet Take 1 tablet (81 mg total) by mouth daily. 90 tablet 3  . clopidogrel (PLAVIX) 75 MG tablet Take 1 tablet (75 mg total) by mouth daily. 30 tablet 12  . donepezil (ARICEPT) 10 MG tablet Take 1 tablet (10 mg total) by mouth at bedtime. 30 tablet 12  . famotidine (PEPCID) 20 MG tablet Take 1 tablet by mouth 2 (two) times daily.    . furosemide (LASIX) 40 MG tablet Take 1 tablet (40 mg total) by mouth daily. 30 tablet   . losartan (COZAAR) 100 MG tablet Take 100 mg by mouth daily.    . metoprolol tartrate (LOPRESSOR) 25 MG tablet Take 1 tablet by mouth 2 (two) times daily.    . nitroGLYCERIN (NITROSTAT) 0.4 MG SL tablet Place 1 tablet (0.4 mg total) under the tongue every 5 (five) minutes as needed for chest pain. MAX 3 dose 25 tablet 3  . NOVOLIN N RELION 100 UNIT/ML injection Inject  0.05 mLs (5 Units total) into the skin 2 (two) times daily. 10 mL 11  . OXYGEN Inhale 2 L into the lungs at bedtime. 2lpm 24/7  DME- [employer]     . VICTOZA 18 MG/3ML SOPN Inject 1.2 mg into the skin every morning.      No current facility-administered medications for this visit.     Allergies  Allergen Reactions  . Codeine Other (See Comments)    Hallucinations  . Tramadol Dermatitis  . Lipitor [Atorvastatin] Other (See Comments)     Joint pain  . Penicillins Rash    HIves/rash Has patient had a PCN reaction causing immediate rash, facial/tongue/throat swelling, SOB or lightheadedness with hypotension: Yes Has patient had a PCN reaction causing severe rash involving mucus membranes or skin necrosis: No Has patient had a PCN reaction that required hospitalization No Has patient had a PCN reaction occurring within the last 10 years: No If all of the above answers are "NO", then may proceed with Cephalosporin use.     Social History   Social History  . Marital status: Married    Spouse name: Rodney Orozco  . Number of children: 3  . Years of education: 54   Occupational History  . Retired    Social History Main Topics  . Smoking status: Former Smoker    Packs/day: 2.00    Years: 50.00    Types: Cigarettes    Quit date: 11/07/2013  . Smokeless tobacco: Never Used  . Alcohol use No  . Drug use: No  . Sexual activity: Not on file   Other Topics Concern  . Not on file   Social History Narrative   Lives with wife   Caffeine use: 4 cups /day (coffee)     Review of Systems: General: negative for chills, fever, night sweats or weight changes.  Cardiovascular: negative for chest pain, dyspnea on exertion, edema, orthopnea, palpitations, paroxysmal nocturnal dyspnea or shortness of breath Dermatological: negative for rash Respiratory: negative for cough or wheezing Urologic: negative for hematuria Abdominal: negative for nausea, vomiting, diarrhea, bright red blood per rectum, melena, or hematemesis Neurologic: negative for visual changes, syncope, or dizziness All other systems reviewed and are otherwise negative except as noted above.    Blood pressure (!) 168/84, pulse 75, height 5' 10.5" (1.791 m), weight 224 lb (101.6 kg), SpO2 93 %.  General appearance: alert and no distress Neck: no adenopathy, no JVD, supple, symmetrical, trachea midline, thyroid not enlarged, symmetric, no tenderness/mass/nodules and  Bilateral carotid bruits Lungs: clear to auscultation bilaterally Heart: regular rate and rhythm, S1, S2 normal, no murmur, click, rub or gallop Extremities: extremities normal, atraumatic, no cyanosis or edema  EKG not performed today  ASSESSMENT AND PLAN:   Hypertension History of hypertension with blood pressure measured today at 168/84. He is on amlodipine and metoprolol as well as losartan. Continue current meds at current dosing  Dyslipidemia History of dyslipidemia not on statin therapy due to statin intolerance followed by his PCP  Pulmonary embolism without acute cor pulmonale ([place]) 2016 post op treated for a year History of pulmonary embolus in the past on Coumadin anticoagulation. The Coumadin has been discontinued because of GI bleed  Paroxysmal atrial fibrillation (HCC) History of paroxysmal atrial fibrillation maintaining sinus rhythm currently not on oral anticoagulant because of GI bleed.  Left ventricular dysfunction History of mild LV dysfunction with an EF in the 45% range. He is on appropriate medications.  Acute combined systolic and diastolic heart failure ([place]) History  of combined systolic and diastolic heart failure and EF of 45% and grade 1 diastolic dysfunction. He is on appropriate medications.  AAA (abdominal aortic aneurysm) [facility]) s/p repair in oct 2017 by dr Scot Dock History of a 5.7 cm abdominal aortic aneurysm status post endoluminal stent grafting by Drs. Scot Dock and Chen 08/22/16.  GI bleed History of GI bleeding in the recent past on aspirin, Plavix and Coumadin. He did require 4 units of packed red blood cell transfusions and underwent endoscopy and colonoscopy without documentation of a bleeding source. He was thought to have a diverticular or a small bowel AVM bleed. As result. He is no longer on Coumadin.  CAD S/P percutaneous coronary angioplasty History of CAD status post acute coronary syndrome, PCI and drug-eluting stenting by Dr. Irish Lack  12/05/16. He had stenting of his mid RCA and his posterior lateral branch with Medtronic Onyx drug-eluting stents. He had residual mid AV groove circumflex and mid PDA disease which were treated medically. He denies chest pain. He will need dual antiplatelet therapy uninterrupted for 12 months.  Carotid artery disease ([place]) History of bilateral ICA stenosis left much worse than right. His left ICA is in the 80-90% range. He is neurologically asymptomatic on aspirin and Plavix. He will need revascularization. I'm referring him back to Dr. Scot Dock for a dilation of endarterectomy versus carotid artery stenting. He cannot stop his dual antiplatelet therapy until January 2019. Otherwise, he is acceptable risk for endarterectomy with mild to moderate LV dysfunction.      Lorretta Harp MD FACP,FACC,FAHA, [facility] 02/28/2017 12:34 PM

## 2017-03-01 ENCOUNTER — Ambulatory Visit (HOSPITAL_COMMUNITY)
Admission: RE | Admit: 2017-03-01 | Discharge: 2017-03-01 | Disposition: A | Discharge status: Home / Self Care | Payer: Medicare HMO | Source: Ambulatory Visit | Attending: Vascular Surgery | Admitting: Vascular Surgery

## 2017-03-01 ENCOUNTER — Ambulatory Visit: Payer: Medicare HMO | Admitting: Neurology

## 2017-03-01 DIAGNOSIS — I779 Disorder of arteries and arterioles, unspecified: Secondary | ICD-10-CM | POA: Diagnosis present

## 2017-03-01 DIAGNOSIS — I6522 Occlusion and stenosis of left carotid artery: Secondary | ICD-10-CM | POA: Diagnosis not present

## 2017-03-01 DIAGNOSIS — I739 Peripheral vascular disease, unspecified: Secondary | ICD-10-CM

## 2017-03-01 LAB — VAS US CAROTID
LCCADDIAS: 12 cm/s
LCCADSYS: 43 cm/s
LEFT ECA DIAS: -49 cm/s
LICADSYS: -48 cm/s
Left CCA prox dias: 11 cm/s
Left CCA prox sys: 48 cm/s
Left ICA dist dias: -13 cm/s
Left ICA prox dias: -14 cm/s
Left ICA prox sys: -28 cm/s

## 2017-03-06 ENCOUNTER — Encounter: Payer: Self-pay | Admitting: Vascular Surgery

## 2017-03-07 ENCOUNTER — Encounter: Payer: Self-pay | Admitting: Vascular Surgery

## 2017-03-07 ENCOUNTER — Ambulatory Visit (INDEPENDENT_AMBULATORY_CARE_PROVIDER_SITE_OTHER): Payer: Medicare HMO | Admitting: Vascular Surgery

## 2017-03-07 ENCOUNTER — Ambulatory Visit: Payer: Medicare HMO | Admitting: Cardiovascular Disease

## 2017-03-07 VITALS — BP 159/74 | HR 82 | Temp 97.4°F | Resp 18 | Ht 70.5 in | Wt 228.0 lb

## 2017-03-07 DIAGNOSIS — I6522 Occlusion and stenosis of left carotid artery: Secondary | ICD-10-CM | POA: Diagnosis not present

## 2017-03-07 NOTE — Progress Notes (Signed)
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 male who underwent endovascular repair of a 6.1 cm infrarenal abdominal aortic aneurysm on 08/31/2016. He was seen by Dr. Quay Burow with bilateral carotid stenoses. The stenosis on the left was greater than 80%. He was referred for consideration for carotid endarterectomy. Of note he is on Plavix which needs to continue until January 2019. He was felt to be an acceptable risk from a cardiac standpoint with mild to moderate LV dysfunction.  The patient is right-handed. He denies any history of stroke, TIAs, expressive or receptive aphasia, or amaurosis fugax. He is on aspirin and Plavix. He is no longer on Coumadin.  Past Medical History:  Diagnosis Date  . AAA (abdominal aortic aneurysm) ([place]) 2017  . Asthma   . Cardiomyopathy ([place])   . Dementia   . Dyslipidemia   . HCAP (healthcare-associated pneumonia) 12/2013   01/06/2014  . Hypertension   . Mixed hyperlipidemia   . On home oxygen therapy    2L during the day  . OSA on CPAP    actually BIPAP  . Osteoarthritis   . Paroxysmal atrial fibrillation ([facility])    takes Warfarin  . Pneumonia December 2014   Had hemoptysis and admitted at [facility].  . Pulmonary embolism ([place]) 12/2014   S/P knee replacement/notes 08/31/2016  . TIA (transient ischemic attack)   . Type II diabetes mellitus ([place])   . Vitamin D deficiency     Family History  Problem Relation Age of Onset  . Dementia Mother   . Stroke Mother   . Heart attack Father     SOCIAL HISTORY: Social History  Substance Use Topics  . Smoking status: Former Smoker    Packs/day: 2.00    Years: 50.00    Types: Cigarettes    Quit date: 11/07/2013  . Smokeless tobacco: Never Used  . Alcohol use No    Allergies  Allergen Reactions  . Codeine Other (See Comments)    Hallucinations  . Tramadol  Dermatitis  . Lipitor [Atorvastatin] Other (See Comments)    Joint pain  . Penicillins Rash    HIves/rash Has patient had a PCN reaction causing immediate rash, facial/tongue/throat swelling, SOB or lightheadedness with hypotension: Yes Has patient had a PCN reaction causing severe rash involving mucus membranes or skin necrosis: No Has patient had a PCN reaction that required hospitalization No Has patient had a PCN reaction occurring within the last 10 years: No If all of the above answers are "NO", then may proceed with Cephalosporin use.     Current Outpatient Prescriptions  Medication Sig Dispense Refill  . amLODipine (NORVASC) 5 MG tablet Take 1 tablet (5 mg total) by mouth daily. 30 tablet 12  . aspirin EC 81 MG tablet Take 1 tablet (81 mg total) by mouth daily. 90 tablet 3  . clopidogrel (PLAVIX) 75 MG tablet Take 1 tablet (75 mg total) by mouth daily. 30 tablet 12  . donepezil (ARICEPT) 10 MG tablet Take 1 tablet (10 mg total) by mouth at bedtime. 30 tablet 12  . famotidine (PEPCID) 20 MG tablet Take 1 tablet by mouth 2 (two) times daily.    . furosemide (LASIX) 40 MG tablet Take 1 tablet (40 mg total) by mouth daily. 30 tablet   . losartan (COZAAR) 100 MG tablet Take 100 mg by mouth daily.    Marland Kitchen  metoprolol tartrate (LOPRESSOR) 25 MG tablet Take 1 tablet by mouth 2 (two) times daily.    . nitroGLYCERIN (NITROSTAT) 0.4 MG SL tablet Place 1 tablet (0.4 mg total) under the tongue every 5 (five) minutes as needed for chest pain. MAX 3 dose 25 tablet 3  . NOVOLIN N RELION 100 UNIT/ML injection Inject 0.05 mLs (5 Units total) into the skin 2 (two) times daily. 10 mL 11  . OXYGEN Inhale 2 L into the lungs at bedtime. 2lpm 24/7  DME- [facility]     . VICTOZA 18 MG/3ML SOPN Inject 1.2 mg into the skin every morning.      No current facility-administered medications for this visit.     REVIEW OF SYSTEMS:  '[X]'$  denotes positive finding, '[ ]'$  denotes negative finding Cardiac  Comments:  Chest  pain or chest pressure:    Shortness of breath upon exertion:    Short of breath when lying flat:    Irregular heart rhythm:        Vascular    Pain in calf, thigh, or hip brought on by ambulation:    Pain in feet at night that wakes you up from your sleep:     Blood clot in your veins:    Leg swelling:         Pulmonary    Oxygen at home:    Productive cough:     Wheezing:         Neurologic    Sudden weakness in arms or legs:     Sudden numbness in arms or legs:     Sudden onset of difficulty speaking or slurred speech:    Temporary loss of vision in one eye:     Problems with dizziness:         Gastrointestinal    Blood in stool:     Vomited blood:         Genitourinary    Burning when urinating:     Blood in urine:        Psychiatric    Major depression:         Hematologic    Bleeding problems:    Problems with blood clotting too easily:        Skin    Rashes or ulcers:        Constitutional    Fever or chills:      PHYSICAL EXAM: Vitals:   03/07/17 1417 03/07/17 1422  BP: (!) 162/88 (!) 159/74  Pulse: 82   Resp: 18   Temp: 97.4 F (36.3 C)   TempSrc: Oral   SpO2: 94%   Weight: 228 lb (103.4 kg)   Height: 5' 10.5" (1.791 m)     GENERAL: The patient is a well-nourished male, in no acute distress. The vital signs are documented above. CARDIAC: There is a regular rate and rhythm.  VASCULAR: He has bilateral carotid bruits. PULMONARY: There is good air exchange bilaterally without wheezing or rales. ABDOMEN: Soft and non-tender with normal pitched bowel sounds.  MUSCULOSKELETAL: There are no major deformities or cyanosis. NEUROLOGIC: No focal weakness or paresthesias are detected. SKIN: There are no ulcers or rashes noted. PSYCHIATRIC: The patient has a normal affect.  DATA:   CAROTID DUPLEX: I reviewed the carotid duplex scan that was done at the [facility] office on 02/14/2017. This showed a 40-59% right carotid stenosis with an 80-99% left  carotid stenosis.  Carotid duplex scan in our office today of the left carotid shows a  greater than 80% left carotid stenosis. The stenosis extends fairly high and there is a loop just distal to the stenosis.  MEDICAL ISSUES:  ASYMPTOMATIC GREATER THAN 80% LEFT CAROTID STENOSIS: This patient has a greater than 80% left carotid stenosis. Given the severity of the stenosis I agree with Dr. Quay Burow that this needs to be addressed. The question is whether or not this could be done surgically or whether he would be better served with carotid stenting. My primary concern based on his duplex is that the carotid has a loop just distal to the stenosis which may be fairly high. Resection of the redundant internal carotid artery would be difficult technically if this is high up the internal carotid artery. This reason, I have recommended a CT angiogram of the neck to further provide await the stenosis in order to determine if this could be addressed surgically or if it would better be addressed with carotid stenting. In addition he is on Plavix which cannot be stopped until January 2019 and I think this also factors into the decision. I'll make further recommendations pending the results of his CT angiogram which we will be scheduled for next Wednesday.  Deitra Mayo [facility] [place] [phone]

## 2017-03-08 LAB — HEMOGLOBIN: Hemoglobin: 9.1 g/dL — ABNORMAL LOW (ref 13.2–17.1)

## 2017-03-08 LAB — HEMATOCRIT: HCT: 30.7 % — ABNORMAL LOW (ref 38.5–50.0)

## 2017-03-08 NOTE — Addendum Note (Signed)
Addended by: Lianne Cure A on: 03/08/2017 09:41 AM   Modules accepted: Orders

## 2017-03-12 ENCOUNTER — Other Ambulatory Visit (INDEPENDENT_AMBULATORY_CARE_PROVIDER_SITE_OTHER): Payer: Self-pay | Admitting: *Deleted

## 2017-03-12 ENCOUNTER — Encounter (INDEPENDENT_AMBULATORY_CARE_PROVIDER_SITE_OTHER): Payer: Self-pay | Admitting: *Deleted

## 2017-03-12 DIAGNOSIS — K922 Gastrointestinal hemorrhage, unspecified: Secondary | ICD-10-CM

## 2017-03-13 ENCOUNTER — Ambulatory Visit (INDEPENDENT_AMBULATORY_CARE_PROVIDER_SITE_OTHER): Payer: Medicare HMO | Admitting: Internal Medicine

## 2017-03-13 ENCOUNTER — Encounter (INDEPENDENT_AMBULATORY_CARE_PROVIDER_SITE_OTHER): Payer: Self-pay | Admitting: Internal Medicine

## 2017-03-13 VITALS — BP 140/78 | HR 72 | Temp 97.5°F | Resp 18 | Ht 70.0 in | Wt 227.2 lb

## 2017-03-13 DIAGNOSIS — D500 Iron deficiency anemia secondary to blood loss (chronic): Secondary | ICD-10-CM

## 2017-03-13 MED ORDER — FLINTSTONES COMPLETE 60 MG PO CHEW: 1.0000 | CHEWABLE_TABLET | Freq: Two times a day (BID) | ORAL | Status: AC

## 2017-03-13 NOTE — Patient Instructions (Signed)
Next blood work is in one week. Call if you have rectal bleeding or tarry stool.

## 2017-03-13 NOTE — Progress Notes (Signed)
Presenting complaint;  Follow-up for GI bleed and anemia.  Database and Subjective:  Rodney Orozco is 76 year old Caucasian male who underwent AAA repair in October 2017 and had two drug-eluting stents on 12/06/2016 for NSTEMI. He had normal hemoglobin of 13.5 on 12/06/2016. [place] with hemoglobin of 7.5 g. He was given 2 L of PRBCs and discharge. He returned about 6 weeks ago with history of melena and drop in his H&H that he required transfusion. Because of recurrent bleed it was decided to proceed with GI evaluation. EGD on 01/27/2017 reveals no abnormality to account for blood loss. Colonoscopy on 01/28/2017 revealed pancolonic diverticulosis but no active bleeding. I felt that he may have bled from right-sided diverticula and therefore further evaluation was not undertaken. He had H&H on 02/02/2017 and was 9.5 and 31.1. H&H on 02/20/2017 was 9.4 and 31.3.  Patient returns for scheduled visit accompanied by his wife. He remains on clopidogrel and low-dose aspirin. He has no complaints. He denies feeling weak. He is doing usual yard work without any difficulty. He denies abdominal pain melena or rectal bleeding. He has good appetite.    Current Medications: Outpatient Encounter Prescriptions as of 03/13/2017  Medication Sig  . amLODipine (NORVASC) 5 MG tablet Take 1 tablet (5 mg total) by mouth daily.  Marland Kitchen aspirin EC 81 MG tablet Take 1 tablet (81 mg total) by mouth daily.  . clopidogrel (PLAVIX) 75 MG tablet Take 1 tablet (75 mg total) by mouth daily.  Marland Kitchen donepezil (ARICEPT) 10 MG tablet Take 1 tablet (10 mg total) by mouth at bedtime.  . famotidine (PEPCID) 20 MG tablet Take 1 tablet by mouth 2 (two) times daily.  . flintstones complete (FLINTSTONES) 60 MG chewable tablet Chew 1 tablet by mouth daily.  . furosemide (LASIX) 40 MG tablet Take 1 tablet (40 mg total) by mouth daily.  Marland Kitchen losartan (COZAAR) 100 MG tablet Take 100 mg by mouth daily.  . metoprolol  tartrate (LOPRESSOR) 25 MG tablet Take 1 tablet by mouth 2 (two) times daily.  . nitroGLYCERIN (NITROSTAT) 0.4 MG SL tablet Place 1 tablet (0.4 mg total) under the tongue every 5 (five) minutes as needed for chest pain. MAX 3 dose  . NOVOLIN N RELION 100 UNIT/ML injection Inject 0.05 mLs (5 Units total) into the skin 2 (two) times daily. (Patient taking differently: Inject 10 Units into the skin 2 (two) times daily. )  . OXYGEN Inhale 2 L into the lungs at bedtime. 2lpm 24/7  DME- [facility]   . VICTOZA 18 MG/3ML SOPN Inject 1.2 mg into the skin every morning.    No facility-administered encounter medications on file as of 03/13/2017.      Objective: Blood pressure 140/78, pulse 72, temperature 97.5 F (36.4 C), temperature source Oral, resp. rate 18, height '5\' 10"'$  (1.778 m), weight 227 lb 3.2 oz (103.1 kg). Patient is alert and in no acute distress. Conjunctiva is pink. Sclera is nonicteric Oropharyngeal mucosa is normal. No neck masses or thyromegaly noted. Cardiac exam with regular rhythm normal S1 and S2. No murmur or gallop noted. Lungs are clear to auscultation. Abdomen is full but soft and nontender without organomegaly or masses.  Rectal examination reveals brown stool in the vault and is guaiac negative. No LE edema or clubbing noted.  Labs/studies Results: H&H 9.1 and 30.7 on 03/08/2017.    Assessment:  #1.  Anemia secondary to GI bleed presumed to be due to colonic diverticulosis in the setting of dual  antiplatelet therapy. Stool is guaiac negative today which is reassuring. However his hemoglobin is not coming up. Therefore need to worry about other possibilities. He could also be losing blood intermittently.   Plan:  Take 2 tablets of Flintstones chewable with iron daily. H&H next week along with serum iron TIBC and ferritin levels. OV in 3 months.

## 2017-03-14 ENCOUNTER — Ambulatory Visit
Admission: RE | Admit: 2017-03-14 | Discharge: 2017-03-14 | Disposition: A | Discharge status: Home / Self Care | Payer: Medicare HMO | Source: Ambulatory Visit | Attending: Vascular Surgery | Admitting: Vascular Surgery

## 2017-03-14 ENCOUNTER — Encounter: Payer: Self-pay | Admitting: Vascular Surgery

## 2017-03-14 ENCOUNTER — Ambulatory Visit (INDEPENDENT_AMBULATORY_CARE_PROVIDER_SITE_OTHER): Payer: Medicare HMO | Admitting: Vascular Surgery

## 2017-03-14 VITALS — BP 159/92 | HR 78 | Temp 97.4°F | Resp 16 | Ht 70.0 in | Wt 225.0 lb

## 2017-03-14 DIAGNOSIS — R918 Other nonspecific abnormal finding of lung field: Secondary | ICD-10-CM

## 2017-03-14 DIAGNOSIS — I6522 Occlusion and stenosis of left carotid artery: Secondary | ICD-10-CM | POA: Diagnosis not present

## 2017-03-14 MED ORDER — IOPAMIDOL (ISOVUE-370) INJECTION 76%
60.0000 mL | Freq: Once | INTRAVENOUS | Status: AC | PRN
  Administered 2017-03-14: 60 mL via INTRAVENOUS

## 2017-03-14 NOTE — Progress Notes (Signed)
Patient name: Rodney Orozco MRN: [id number] DOB: 07-Mar-1941 Sex: male  REASON FOR VISIT:    Follow up after CT angiogram of neck.  HPI:   Rodney Orozco is a 76 y.o. male who I last saw on 03/07/2017. I previously performed an endovascular aneurysm repair for 6.1 cm infrarenal abdominal aortic aneurysm. Dr. Quay Burow was following him with bilateral carotid stenoses. It appeared that the stenosis on the left had progressed to greater than 80% and was sent for evaluation for possible carotid endarterectomy. He is on Plavix and needs to remain on this until January 2019. He had 2 drug-eluting stents placed by Dr. Irish Lack on 12/05/2016.  He does have moderate LV dysfunction.  From a carotid standpoint he is asymptomatic. He denies any history of stroke, TIAs, expressive or receptive aphasia, or emesis fugax.  He is on aspirin in addition to his Plavix. He does not tolerate statins.  Past Medical History:  Diagnosis Date  . AAA (abdominal aortic aneurysm) ([place]) 2017  . Asthma   . Cardiomyopathy ([place])   . Dementia   . Dyslipidemia   . HCAP (healthcare-associated pneumonia) 12/2013   01/06/2014  . Hypertension   . Mixed hyperlipidemia   . On home oxygen therapy    2L during the day  . OSA on CPAP    actually BIPAP  . Osteoarthritis   . Paroxysmal atrial fibrillation ([facility])    takes Warfarin  . Pneumonia December 2014   Had hemoptysis and [facility] at [facility].  . Pulmonary embolism ([place]) 12/2014   S/P knee replacement/notes 08/31/2016  . TIA (transient ischemic attack)   . Type II diabetes mellitus ([place])   . Vitamin D deficiency     Family History  Problem Relation Age of Onset  . Dementia Mother   . Stroke Mother   . Heart attack Father     SOCIAL HISTORY: Social History  Substance Use Topics  . Smoking status: Former Smoker    Packs/day: 2.00    Years: 50.00    Types: Cigarettes    Quit date: 11/07/2013  . Smokeless tobacco: Never Used  . Alcohol use  No    Allergies  Allergen Reactions  . Codeine Other (See Comments)    Hallucinations  . Tramadol Dermatitis  . Lipitor [Atorvastatin] Other (See Comments)    Joint pain  . Penicillins Rash    HIves/rash Has patient had a PCN reaction causing immediate rash, facial/tongue/throat swelling, SOB or lightheadedness with hypotension: Yes Has patient had a PCN reaction causing severe rash involving mucus membranes or skin necrosis: No Has patient had a PCN reaction that required hospitalization No Has patient had a PCN reaction occurring within the last 10 years: No If all of the above answers are "NO", then may proceed with Cephalosporin use.     Current Outpatient Prescriptions  Medication Sig Dispense Refill  . amLODipine (NORVASC) 5 MG tablet Take 1 tablet (5 mg total) by mouth daily. 30 tablet 12  . aspirin EC 81 MG tablet Take 1 tablet (81 mg total) by mouth daily. 90 tablet 3  . clopidogrel (PLAVIX) 75 MG tablet Take 1 tablet (75 mg total) by mouth daily. 30 tablet 12  . donepezil (ARICEPT) 10 MG tablet Take 1 tablet (10 mg total) by mouth at bedtime. 30 tablet 12  . famotidine (PEPCID) 20 MG tablet Take 1 tablet by mouth 2 (two) times daily.    . flintstones complete (FLINTSTONES) 60 MG chewable tablet Chew 1  tablet by mouth 2 (two) times daily.    . furosemide (LASIX) 40 MG tablet Take 1 tablet (40 mg total) by mouth daily. 30 tablet   . losartan (COZAAR) 100 MG tablet Take 100 mg by mouth daily.    . metoprolol tartrate (LOPRESSOR) 25 MG tablet Take 1 tablet by mouth 2 (two) times daily.    . nitroGLYCERIN (NITROSTAT) 0.4 MG SL tablet Place 1 tablet (0.4 mg total) under the tongue every 5 (five) minutes as needed for chest pain. MAX 3 dose 25 tablet 3  . NOVOLIN N RELION 100 UNIT/ML injection Inject 0.05 mLs (5 Units total) into the skin 2 (two) times daily. (Patient taking differently: Inject 10 Units into the skin 2 (two) times daily. ) 10 mL 11  . OXYGEN Inhale 2 L into the  lungs at bedtime. 2lpm 24/7  DME- [facility]     . VICTOZA 18 MG/3ML SOPN Inject 1.2 mg into the skin every morning.      No current facility-administered medications for this visit.     REVIEW OF SYSTEMS:  '[X]'$  denotes positive finding, '[ ]'$  denotes negative finding Cardiac  Comments:  Chest pain or chest pressure:    Shortness of breath upon exertion: X   Short of breath when lying flat:    Irregular heart rhythm:        Vascular    Pain in calf, thigh, or hip brought on by ambulation:    Pain in feet at night that wakes you up from your sleep:     Blood clot in your veins:    Leg swelling:         Pulmonary    Oxygen at home: X   Productive cough:     Wheezing:         Neurologic    Sudden weakness in arms or legs:     Sudden numbness in arms or legs:     Sudden onset of difficulty speaking or slurred speech:    Temporary loss of vision in one eye:     Problems with dizziness:         Gastrointestinal    Blood in stool:     Vomited blood:         Genitourinary    Burning when urinating:     Blood in urine:        Psychiatric    Major depression:         Hematologic    Bleeding problems:    Problems with blood clotting too easily:        Skin    Rashes or ulcers:        Constitutional    Fever or chills:     PHYSICAL EXAM:   Vitals:   03/14/17 1040 03/14/17 1046  BP: (!) 147/78 (!) 159/92  Pulse: 78   Resp: 16   Temp: 97.4 F (36.3 C)   TempSrc: Oral   SpO2: 96%   Weight: 225 lb (102.1 kg)   Height: '5\' 10"'$  (1.778 m)     GENERAL: The patient is a well-nourished male, in no acute distress. The vital signs are documented above. CARDIAC: There is a regular rate and rhythm.  VASCULAR: He has bilateral carotid bruits. He has palpable femoral and posterior tibial pulses bilaterally. PULMONARY: There is good air exchange bilaterally without wheezing or rales. ABDOMEN: Soft and non-tender with normal pitched bowel sounds.  MUSCULOSKELETAL: There are no  major deformities or cyanosis. NEUROLOGIC: No  focal weakness or paresthesias are detected. SKIN: There are no ulcers or rashes noted. PSYCHIATRIC: The patient has a normal affect.  DATA:    CT ANGIOGRAM NECK:   There is a short segment stenosis of the left internal carotid artery that is estimated to be 70-75%. This is fairly high up the internal carotid artery where the artery is slightly kinked.  An incidental finding unfortunately, is new right paratracheal lymphadenopathy which is suspicious for metastatic bronchogenic carcinoma. There is also a partially visible spiculated right lung nodule.  MEDICAL ISSUES:   80% LEFT INTERNAL CAROTID ARTERY STENOSIS: He has an 80% left internal carotid artery stenosis that is fairly high up the internal carotid artery and I think that this would be difficult to access surgically. Therefore I would favor carotid stenting over carotid endarterectomy and have discussed this with Dr. Quay Burow. He is on aspirin and is on Plavix.  NEW RIGHT LUNG MASS: The situation is complicated by an incidental finding of a spiculated right lung mass on his neck CT. In addition, he has new paratracheal adenopathy and radiology felt that these findings could be consistent with metastatic bronchogenic carcinoma. We will arrange for evaluation by [facility]. Radiology had suggested a PET-CT of the chest, and we will discuss this with [facility] and try to arrange a visit as soon as possible. Pending this evaluation we can discuss the timing of possible carotid stenting with Dr. Quay Burow.  Deitra Mayo [facility] [place] [phone]

## 2017-03-15 ENCOUNTER — Telehealth (INDEPENDENT_AMBULATORY_CARE_PROVIDER_SITE_OTHER): Payer: Self-pay | Admitting: *Deleted

## 2017-03-15 DIAGNOSIS — D500 Iron deficiency anemia secondary to blood loss (chronic): Secondary | ICD-10-CM | POA: Diagnosis not present

## 2017-03-15 NOTE — Telephone Encounter (Signed)
   Diagnosis:    Result(s)   Card 1:Negative:           Completed by: Dr.Najeeb Rehman  HEMOCCULT SENSA DEVELOPER: TRZ#:[id number]   EXPIRATION DATE: 2020-05   HEMOCCULT SENSA CARD:  OLI#:[id number]   EXPIRATION DATE: 03/20   CARD CONTROL RESULTS:  POSITIVE: Positive  NEGATIVE: Negative    ADDITIONAL COMMENTS: Patient was given results at the time of the office visit .

## 2017-03-26 LAB — HEMOGLOBIN AND HEMATOCRIT, BLOOD
HCT: 32.2 % — ABNORMAL LOW (ref 38.5–50.0)
HEMOGLOBIN: 9.6 g/dL — AB (ref 13.2–17.1)

## 2017-03-27 ENCOUNTER — Other Ambulatory Visit (INDEPENDENT_AMBULATORY_CARE_PROVIDER_SITE_OTHER): Payer: Self-pay | Admitting: *Deleted

## 2017-03-27 DIAGNOSIS — K922 Gastrointestinal hemorrhage, unspecified: Secondary | ICD-10-CM

## 2017-03-28 ENCOUNTER — Encounter: Payer: Medicare HMO | Admitting: Vascular Surgery

## 2017-03-28 ENCOUNTER — Encounter: Payer: Medicare HMO | Admitting: Thoracic Surgery (Cardiothoracic Vascular Surgery)

## 2017-03-28 ENCOUNTER — Ambulatory Visit (HOSPITAL_COMMUNITY)
Admission: RE | Admit: 2017-03-28 | Discharge: 2017-03-28 | Disposition: A | Discharge status: Home / Self Care | Payer: Medicare HMO | Source: Ambulatory Visit | Attending: Vascular Surgery | Admitting: Vascular Surgery

## 2017-03-28 DIAGNOSIS — R918 Other nonspecific abnormal finding of lung field: Secondary | ICD-10-CM | POA: Diagnosis not present

## 2017-03-28 DIAGNOSIS — I6522 Occlusion and stenosis of left carotid artery: Secondary | ICD-10-CM | POA: Diagnosis not present

## 2017-03-28 DIAGNOSIS — C771 Secondary and unspecified malignant neoplasm of intrathoracic lymph nodes: Secondary | ICD-10-CM | POA: Diagnosis not present

## 2017-03-28 LAB — GLUCOSE, CAPILLARY: Glucose-Capillary: 154 mg/dL — ABNORMAL HIGH (ref 65–99)

## 2017-03-28 MED ORDER — FLUDEOXYGLUCOSE F - 18 (FDG) INJECTION
10.9500 | Freq: Once | INTRAVENOUS | Status: AC | PRN
  Administered 2017-03-28: 10.95 via INTRAVENOUS

## 2017-04-04 ENCOUNTER — Institutional Professional Consult (permissible substitution) (INDEPENDENT_AMBULATORY_CARE_PROVIDER_SITE_OTHER): Payer: Medicare HMO | Admitting: Cardiothoracic Surgery

## 2017-04-04 ENCOUNTER — Other Ambulatory Visit: Payer: Self-pay | Admitting: *Deleted

## 2017-04-04 ENCOUNTER — Encounter: Payer: Self-pay | Admitting: Cardiothoracic Surgery

## 2017-04-04 VITALS — BP 126/78 | HR 100 | Resp 16 | Ht 70.0 in | Wt 225.0 lb

## 2017-04-04 DIAGNOSIS — D381 Neoplasm of uncertain behavior of trachea, bronchus and lung: Secondary | ICD-10-CM

## 2017-04-04 DIAGNOSIS — J432 Centrilobular emphysema: Secondary | ICD-10-CM | POA: Diagnosis not present

## 2017-04-04 DIAGNOSIS — R599 Enlarged lymph nodes, unspecified: Secondary | ICD-10-CM

## 2017-04-04 DIAGNOSIS — R911 Solitary pulmonary nodule: Secondary | ICD-10-CM

## 2017-04-04 DIAGNOSIS — R591 Generalized enlarged lymph nodes: Secondary | ICD-10-CM

## 2017-04-04 NOTE — Progress Notes (Signed)
PCP is [name], DO Referring Provider is Angelia Mould,*  Chief Complaint  Patient presents with  . Lung Lesion    eval right upper lobe lung lesion, review CTA NECK and PET scan  . Adenopathy    peritracheal    HPI: Patient examined, CT scan and PET scan images personally reviewed and counseled with patient and family  76 year old Caucasian male ex-smoker was being evaluated for possible left carotid stent and had a neck CTA. This demonstrated a right upper lobe mass. It measured 1.8 cm. A subsequent PET scan showed this to have hypermetabolic activity with SUV of 6.8. There is a paratracheal lymph node with SUV of 15.0 and a subcarinal node with SUV of 8.0. The patient denies any pulmonary symptoms. He denies recent respiratory infection. A year ago he was hospitalized at [facility] for COPD flare up and was intubated-[place] was [place] the beach. He is on home oxygen and still smokes one to 2 packs per day. He is on CPAP daily at bedtime  Last year the patient underwent a repair of AAA with a stent graft by Dr. Scot Dock. Following this he developed unstable angina and underwent 2 coronary stents by Dr. Irish Lack. The stents were placed in December. He is on Plavix. His EF is 45%.  Patient presents today for discussion of his abnormal PET scan and the concern over he could have a lung malignancy. Past Medical History:  Diagnosis Date  . AAA (abdominal aortic aneurysm) ([place]) 2017  . Asthma   . Cardiomyopathy ([place])   . Dementia   . Dyslipidemia   . HCAP (healthcare-associated pneumonia) 12/2013   01/06/2014  . Hypertension   . Mixed hyperlipidemia   . On home oxygen therapy    2L during the day  . OSA on CPAP    actually BIPAP  . Osteoarthritis   . Paroxysmal atrial fibrillation (H[facility])    takes Warfarin  . Pneumonia December 2014   Had hemoptysis and admitted at [facility].  . Pulmonary embolism ([place]) 12/2014   S/P knee replacement/notes  08/31/2016  . TIA (transient ischemic attack)   . Type II diabetes mellitus ([place])   . Vitamin D deficiency     Past Surgical History:  Procedure Laterality Date  . ABDOMINAL AORTIC ANEURYSM REPAIR  08/31/2016  . ABDOMINAL AORTIC ENDOVASCULAR STENT GRAFT N/A 08/31/2016   Procedure: ABDOMINAL AORTIC ENDOVASCULAR STENT GRAFT and Open Exposure left common femoral artery;  Surgeon: Angelia Mould, MD;  Location: [facility];  Service: Vascular;  Laterality: N/A;  . ABDOMINAL SURGERY  1999   "for aneurysm"  . ABDOMINAL SURGERY     bleeding PUD in setting of aspirin powders. REMOTE. also H.pylori positive per epic notes  . BACK SURGERY    . CARDIAC CATHETERIZATION N/A 12/05/2016   Procedure: Right/Left Heart Cath and Coronary Angiography;  Surgeon: Jettie Booze, MD;  Location: [place] CV LAB;  Service: Cardiovascular;  Laterality: N/A;  . CARDIAC CATHETERIZATION N/A 12/05/2016   Procedure: Coronary Stent Intervention;  Surgeon: Jettie Booze, MD;  Location: [place] CV LAB;  Service: Cardiovascular;  Laterality: N/A;  . COLONOSCOPY N/A 01/28/2017   Procedure: COLONOSCOPY;  Surgeon: Rogene [place], MD;  Location[facility];  Service: Endoscopy;  Laterality: N/A;  . COLONOSCOPY WITH ESOPHAGOGASTRODUODENOSCOPY (EGD)  2004   Dr. Amedeo Plenty: antral gastritis, hypersplastic rectal polyp, few scattered diverticula  . ESOPHAGOGASTRODUODENOSCOPY N/A 01/27/2017   Procedure: ESOPHAGOGASTRODUODENOSCOPY (EGD);  Surgeon: Mechele Dawley  Laural Golden, MD;  Location: [facility];  Service: Endoscopy;  Laterality: N/A;  . JOINT REPLACEMENT    . [place]  . TOTAL KNEE ARTHROPLASTY Bilateral Feb 2016 - Dec 2016    Family History  Problem Relation Age of Onset  . Dementia Mother   . Stroke Mother   . Heart attack Father     Social History Social History  Substance Use Topics  . Smoking status: Former Smoker    Packs/day: 2.00    Years: 50.00    Types: Cigarettes    Quit date:  11/07/2013  . Smokeless tobacco: Never Used  . Alcohol use No    Current Outpatient Prescriptions  Medication Sig Dispense Refill  . amLODipine (NORVASC) 5 MG tablet Take 1 tablet (5 mg total) by mouth daily. 30 tablet 12  . aspirin EC 81 MG tablet Take 1 tablet (81 mg total) by mouth daily. 90 tablet 3  . clopidogrel (PLAVIX) 75 MG tablet Take 1 tablet (75 mg total) by mouth daily. 30 tablet 12  . donepezil (ARICEPT) 10 MG tablet Take 1 tablet (10 mg total) by mouth at bedtime. 30 tablet 12  . famotidine (PEPCID) 20 MG tablet Take 1 tablet by mouth 2 (two) times daily.    . flintstones complete (FLINTSTONES) 60 MG chewable tablet Chew 1 tablet by mouth 2 (two) times daily.    . furosemide (LASIX) 40 MG tablet Take 1 tablet (40 mg total) by mouth daily. 30 tablet   . losartan (COZAAR) 100 MG tablet Take 100 mg by mouth daily.    . metFORMIN (GLUCOPHAGE) 1000 MG tablet Take 1,000 mg by mouth 2 (two) times daily with a meal.    . metoprolol tartrate (LOPRESSOR) 25 MG tablet Take 1 tablet by mouth 2 (two) times daily.    . nitroGLYCERIN (NITROSTAT) 0.4 MG SL tablet Place 1 tablet (0.4 mg total) under the tongue every 5 (five) minutes as needed for chest pain. MAX 3 dose 25 tablet 3  . NOVOLIN N RELION 100 UNIT/ML injection Inject 0.05 mLs (5 Units total) into the skin 2 (two) times daily. (Patient taking differently: Inject 10 Units into the skin 2 (two) times daily. ) 10 mL 11  . OXYGEN Inhale 2 L into the lungs at bedtime. 2lpm 24/7  DME- [employer]     . VICTOZA 18 MG/3ML SOPN Inject 1.2 mg into the skin every morning.      No current facility-administered medications for this visit.     Allergies  Allergen Reactions  . Codeine Other (See Comments)    Hallucinations  . Tramadol Dermatitis  . Lipitor [Atorvastatin] Other (See Comments)    Joint pain  . Penicillins Rash    HIves/rash Has patient had a PCN reaction causing immediate rash, facial/tongue/throat swelling, SOB or  lightheadedness with hypotension: Yes Has patient had a PCN reaction causing severe rash involving mucus membranes or skin necrosis: No Has patient had a PCN reaction that required hospitalization No Has patient had a PCN reaction occurring within the last 10 years: No If all of the above answers are "NO", then may proceed with Cephalosporin use.     Review of Systems        Review of Systems :  [ y ] = yes, [  ] = no        General :  Weight gain [   ]    Weight loss  [   ]  Fatigue [  ]  Fever [  ]  Chills  [  ]                                Weakness  [  ]           HEENT    Headache [  ]  Dizziness [  ]  Blurred vision [  ] Glaucoma  [  ]                          Nosebleeds [ yes ] Painful or loose teeth [ total edentulous ]        Cardiac :  Chest pain/ pressure [  ]  Resting SOB [  ] exertional SOB [ yes ]hypertension  On medication                        Orthopnea [  ]  Pedal edema  [  ]  Palpitations [  ] Syncope/presyncope [ ]                         Paroxysmal nocturnal dyspnea [  ]         Pulmonary : cough [ yes ]  wheezing [  ]  Hemoptysis [  ] Sputum [  ] Snoring [ yes- ]                              Pneumothorax [  ]  Sleep apnea [yes on CPAP  ]        GI : Vomiting [  ]  Dysphagia [  ]  Melena  [  ]  Abdominal pain [  ] BRBPR [ yes when he took Coumadin years ago ]              Heart burn [  ]  Constipation [  ] Diarrhea  [  ] Colonoscopy [   ]        GU : Hematuria [  ]  Dysuria [  ]  Nocturia [  ] UTI's [  ]        Vascular : Claudication [  ]  Rest pain [  ]  DVT [  ] Vein stripping [  ] leg ulcers [  ]                          TIA [  ] Stroke [  yes 80% left carotid stenosis]  Varicose veins [  ]        NEURO :  Headaches  [  ] Seizures [  ] Vision changes [  ] Paresthesias [  ]                                       Seizures [  ]        Musculoskeletal :  Arthritis [place].Blacker  ] Gout  [  ]  Back pain [  yes]  Joint pain [  ]        Skin :  Rash [  ]  Melanoma [  ]  Sores [  ] Easy bruising from Plavix  Heme : Bleeding problems [  ]Clotting Disorders [  ] Anemia [ yes last hemoglobin 9.8 ]Blood Transfusion [ ]         Endocrine : Diabetes [ yes ] Heat or Cold intolerance [  ] Polyuria [  ]excessive thirst [ ]         Psych : Depression [  ]  Anxiety [  ]  Psych hospitalizations [  ] Memory change [  ]                                               BP 126/78 (BP Location: Right Arm, Patient Position: Sitting, Cuff Size: Large)   Pulse 100   Resp 16 Comment: ON RA  Ht 5\' 10"  (1.778 m)   Wt 225 lb (102.1 kg)   SpO2 90% Comment: ON RA...HAS O2 THQAT HE USES MOST OF THE TIME AT 2L  BMI 32.28 kg/m  Physical Exam      Physical Exam  General: Obese pleasant male with evidence of COPD HEENT: Normocephalic pupils equal , dentition adequate Neck: Supple without JVD, adenopathy, bilateral carotid bruit , left greater than right Chest: Clear to auscultation, symmetrical breath sounds, no rhonchi, no tenderness             or deformity Cardiovascular: Regular rate and rhythm, no murmur, no gallop, peripheral pulses             palpable in all extremities Abdomen:  Soft, nontender, obese, no palpable mass or organomegaly Extremities: Warm, well-perfused, no clubbing cyanosis edema or tenderness,              no venous stasis changes of the legs Rectal/GU: Deferred Neuro: Grossly non--focal and symmetrical throughout Skin: Clean and dry without rash or ulceration   Diagnostic Tests: PET scan shows probable right upper lobe lung cancer primary with mediastinal nodal involvement  Impression: Clinical stage III lung cancer Patient needs tissue diagnosis for confirmation and to determine therapy Patient not a surgical candidate for pulmonary section because of his underlying severe COPD  Patient will be scheduled for bronchoscopy biopsy and possible mediastinoscopy to provide tissue diagnosis. I discussed the procedures with the patient and his  family including there indications and risks to include bleeding. The patient will stop his Plavix 2 days before the biopsies. The stents have been in place for almost 5 months so Plavix will be reinstated shortly after the procedure. Plan: Patient be scheduled for bronchoscopy, biopsy and possible mediastinoscopy on Tuesday, May 29 at [facility]. We will plan on holding the patient overnight for observation due to his underlying lung disease and other medical problems.  Len Childs, MD [facility] [phone]

## 2017-04-05 ENCOUNTER — Other Ambulatory Visit (INDEPENDENT_AMBULATORY_CARE_PROVIDER_SITE_OTHER): Payer: Self-pay | Admitting: *Deleted

## 2017-04-05 ENCOUNTER — Encounter (INDEPENDENT_AMBULATORY_CARE_PROVIDER_SITE_OTHER): Payer: Self-pay | Admitting: *Deleted

## 2017-04-05 ENCOUNTER — Other Ambulatory Visit: Payer: Self-pay | Admitting: *Deleted

## 2017-04-05 DIAGNOSIS — D500 Iron deficiency anemia secondary to blood loss (chronic): Secondary | ICD-10-CM

## 2017-04-05 DIAGNOSIS — K922 Gastrointestinal hemorrhage, unspecified: Secondary | ICD-10-CM

## 2017-04-05 NOTE — Pre-Procedure Instructions (Signed)
Rodney Orozco  04/05/2017      [place], [place] [address] Phone: [phone] Fax: [facility] [place], [place] [address] Phone: [phone] Fax: [phone]    Your procedure is scheduled on May 30  Report to [facility] at [place].M.  Call this number if you have problems the morning of surgery:  [phone]   Remember:  Do not eat food or drink liquids after midnight.   Take these medicines the morning of surgery with A SIP OF WATER acetaminophen (TYLENOL) if needed, amLODipine (NORVASC), famotidine (PEPCID), metoprolol tartrate (LOPRESSOR), nitroGLYCERIN (NITROSTAT)  If needed,   7 days prior to surgery STOP taking any Aspirin, Aleve, Naproxen, Ibuprofen, Motrin, Advil, Goody's, BC's, all herbal medications, fish oil, and all vitamins  FOLLOW MD INSTRUCTIONS ABOUT PLAVIX AND ASPIRIN  WHAT DO I DO ABOUT MY DIABETES MEDICATION?   Rodney Kitchen Do not take oral diabetes medicines (pills) the morning of surgery. metFORMIN (GLUCOPHAGE)   THE DAY BEFORE SURGERY FOR PM DOSE TAKE 5 units of NOVOLIN N RELION       . HE MORNING OF SURGERY, take ____5_________ units of __NOVOLIN N RELION________insulin.  . The day of surgery, do not take other diabetes injectables, including Byetta (exenatide), Bydureon (exenatide ER), Victoza (liraglutide), or Trulicity (dulaglutide).  . If your CBG is greater than 220 mg/dL, you may take  of your sliding scale (correction) dose of insulin.  How to Manage Your Diabetes Before and After Surgery  Why is it important to control my blood sugar before and after surgery? . Improving blood sugar levels before and after surgery helps healing and can limit problems. . A way of improving blood sugar control is eating a healthy diet by: o  Eating less sugar and carbohydrates o  Increasing  activity/exercise o  Talking with your doctor about reaching your blood sugar goals . High blood sugars (greater than 180 mg/dL) can raise your risk of infections and slow your recovery, so you will need to focus on controlling your diabetes during the weeks before surgery. . Make sure that the doctor who takes care of your diabetes knows about your planned surgery including the date and location.  How do I manage my blood sugar before surgery? . Check your blood sugar at least 4 times a day, starting 2 days before surgery, to make sure that the level is not too high or low. o Check your blood sugar the morning of your surgery when you wake up and every 2 hours until you get to the Short Stay unit. . If your blood sugar is less than 70 mg/dL, you will need to treat for low blood sugar: o Do not take insulin. o Treat a low blood sugar (less than 70 mg/dL) with  cup of clear juice (cranberry or apple), 4 glucose tablets, OR glucose gel. o Recheck blood sugar in 15 minutes after treatment (to make sure it is greater than 70 mg/dL). If your blood sugar is not greater than 70 mg/dL on recheck, call [phone] for further instructions. . Report your blood sugar to the short stay nurse when you get to Short Stay.  . If you are admitted to the hospital after surgery: o Your blood sugar will be checked by the staff and you will probably be given insulin after surgery (  instead of oral diabetes medicines) to make sure you have good blood sugar levels. o The goal for blood sugar control after surgery is 80-180 mg/dL.     Do not wear jewelry  Do not wear lotions, powders, or cologne, or deoderant.   Men may shave face and neck.  Do not bring valuables to the hospital.  [facility] is not responsible for any belongings or valuables.  Contacts, dentures or bridgework may not be worn into surgery.  Leave your suitcase in the car.  After surgery it may be brought to your room.  For patients admitted to  the hospital, discharge time will be determined by your treatment team.  Patients discharged the day of surgery will not be allowed to drive home.    Special instructions:   [facility]- Preparing For Surgery  Before surgery, you can play an important role. Because skin is not sterile, your skin needs to be as free of germs as possible. You can reduce the number of germs on your skin by washing with CHG (chlorahexidine gluconate) Soap before surgery.  CHG is an antiseptic cleaner which kills germs and bonds with the skin to continue killing germs even after washing.  Please do not use if you have an allergy to CHG or antibacterial soaps. If your skin becomes reddened/irritated stop using the CHG.  Do not shave (including legs and underarms) for at least 48 hours prior to first CHG shower. It is OK to shave your face.  Please follow these instructions carefully.   1. Shower the NIGHT BEFORE SURGERY and the MORNING OF SURGERY with CHG.   2. If you chose to wash your hair, wash your hair first as usual with your normal shampoo.  3. After you shampoo, rinse your hair and body thoroughly to remove the shampoo.  4. Use CHG as you would any other liquid soap. You can apply CHG directly to the skin and wash gently with a scrungie or a clean washcloth.   5. Apply the CHG Soap to your body ONLY FROM THE NECK DOWN.  Do not use on open wounds or open sores. Avoid contact with your eyes, ears, mouth and genitals (private parts). Wash genitals (private parts) with your normal soap.  6. Wash thoroughly, paying special attention to the area where your surgery will be performed.  7. Thoroughly rinse your body with warm water from the neck down.  8. DO NOT shower/wash with your normal soap after using and rinsing off the CHG Soap.  9. Pat yourself dry with a CLEAN TOWEL.   10. Wear CLEAN PAJAMAS   11. Place CLEAN SHEETS on your bed the night of your first shower and DO NOT SLEEP WITH  PETS.    Day of Surgery: Do not apply any deodorants/lotions. Please wear clean clothes to the hospital/surgery center.      Please read over the following fact sheets that you were given.

## 2017-04-06 ENCOUNTER — Encounter (HOSPITAL_COMMUNITY)
Admission: RE | Admit: 2017-04-06 | Discharge: 2017-04-06 | Disposition: A | Discharge status: Home / Self Care | Payer: Medicare HMO | Source: Ambulatory Visit | Attending: Cardiothoracic Surgery | Admitting: Cardiothoracic Surgery

## 2017-04-06 ENCOUNTER — Encounter (HOSPITAL_COMMUNITY): Payer: Self-pay

## 2017-04-06 ENCOUNTER — Ambulatory Visit (HOSPITAL_COMMUNITY)
Admission: RE | Admit: 2017-04-06 | Discharge: 2017-04-06 | Disposition: A | Discharge status: Home / Self Care | Payer: Medicare HMO | Source: Ambulatory Visit | Attending: Cardiothoracic Surgery | Admitting: Cardiothoracic Surgery

## 2017-04-06 DIAGNOSIS — Z01818 Encounter for other preprocedural examination: Secondary | ICD-10-CM | POA: Insufficient documentation

## 2017-04-06 DIAGNOSIS — Z01812 Encounter for preprocedural laboratory examination: Secondary | ICD-10-CM | POA: Insufficient documentation

## 2017-04-06 DIAGNOSIS — R911 Solitary pulmonary nodule: Secondary | ICD-10-CM | POA: Diagnosis not present

## 2017-04-06 DIAGNOSIS — Z01810 Encounter for preprocedural cardiovascular examination: Secondary | ICD-10-CM | POA: Diagnosis not present

## 2017-04-06 HISTORY — DX: Gastrointestinal hemorrhage, unspecified: K92.2

## 2017-04-06 LAB — COMPREHENSIVE METABOLIC PANEL
ALT: 13 U/L — ABNORMAL LOW (ref 17–63)
AST: 21 U/L (ref 15–41)
Albumin: 3.9 g/dL (ref 3.5–5.0)
Alkaline Phosphatase: 97 U/L (ref 38–126)
Anion gap: 9 (ref 5–15)
BUN: 15 mg/dL (ref 6–20)
CO2: 26 mmol/L (ref 22–32)
Calcium: 9.4 mg/dL (ref 8.9–10.3)
Chloride: 104 mmol/L (ref 101–111)
Creatinine, Ser: 1.46 mg/dL — ABNORMAL HIGH (ref 0.61–1.24)
GFR calc Af Amer: 53 mL/min — ABNORMAL LOW (ref >60)
GFR calc non Af Amer: 46 mL/min — ABNORMAL LOW (ref >60)
Glucose, Bld: 132 mg/dL — ABNORMAL HIGH (ref 65–99)
Potassium: 3.9 mmol/L (ref 3.5–5.1)
Sodium: 139 mmol/L (ref 135–145)
Total Bilirubin: 0.5 mg/dL (ref 0.3–1.2)
Total Protein: 7.1 g/dL (ref 6.5–8.1)

## 2017-04-06 LAB — PROTIME-INR
INR: 0.97
Prothrombin Time: 12.9 seconds (ref 11.4–15.2)

## 2017-04-06 LAB — CBC
HCT: 34.2 % — ABNORMAL LOW (ref 39.0–52.0)
Hemoglobin: 9.9 g/dL — ABNORMAL LOW (ref 13.0–17.0)
MCH: 24.2 pg — ABNORMAL LOW (ref 26.0–34.0)
MCHC: 28.9 g/dL — ABNORMAL LOW (ref 30.0–36.0)
MCV: 83.6 fL (ref 78.0–100.0)
Platelets: 440 10*3/uL — ABNORMAL HIGH (ref 150–400)
RBC: 4.09 MIL/uL — ABNORMAL LOW (ref 4.22–5.81)
RDW: 17.4 % — ABNORMAL HIGH (ref 11.5–15.5)
WBC: 10.3 10*3/uL (ref 4.0–10.5)

## 2017-04-06 LAB — SURGICAL PCR SCREEN
MRSA, PCR: NEGATIVE
Staphylococcus aureus: NEGATIVE

## 2017-04-06 LAB — GLUCOSE, CAPILLARY: Glucose-Capillary: 123 mg/dL — ABNORMAL HIGH (ref 65–99)

## 2017-04-06 LAB — APTT: aPTT: 32 seconds (ref 24–36)

## 2017-04-06 NOTE — Progress Notes (Signed)
PCP - Caren Griffins butler Cardiologist - [place]  Chest x-ray - 04/06/17 EKG - 04/06/17 Stress Test - 07/20/16 ECHO - 12/04/16 Cardiac Cath - 12/05/16  Sleep Study - 05/30/16 CPAP - bipap at night and he wears 2Lo2 PRN during the day when he is short of breath  Fasting Blood Sugar - 120-125 Checks Blood Sugar _3____ times a day  Sending to anesthesia for review of cardiac history Patient was instructed to stop his plavix on Saturday [facility] for aspirin instructions.  Will need to notify patient of what to do about his aspirin.   Patient denies shortness of breath, fever, cough and chest pain at PAT appointment   Patient verbalized understanding of instructions that were given to them at the PAT appointment. Patient was also instructed that they will need to review over the PAT instructions again at home before surgery.

## 2017-04-10 MED ORDER — VANCOMYCIN HCL 10 G IV SOLR
1500.0000 mg | INTRAVENOUS | Status: AC
  Administered 2017-04-11: 1500 mg via INTRAVENOUS
  Filled 2017-04-10: qty 1500

## 2017-04-10 NOTE — Progress Notes (Signed)
Anesthesia Chart Review:  Pt is a 76 year old male scheduled for video bronchoscopy with endobronchial ultrasound, possible mediastinal endoscopy on 04/11/2017 with Ivin Poot, M.D.  - PCP is [name], DO - Cardiologist is Quay Burow, MD - Vascular surgeon is Deitra Mayo, MD.    Brief summary: Pt underwent AAA EVAR 08/31/16; had acute coronary syndrome, 2 DES were placed 12/05/16, he has residual disease; has severe asymptomatic L ICA stenosis not amenable to CEA- stenting planned for 1 year after DES placement; stage III lung cancer identified incidentally on CT of neck, not a candidate for surgical resection, now scheduled for bronchoscopy, biopsy, possible mediastinoscopy.   PMH includes: CAD (DES to RCA and posterolateral artery 12/05/16), cardiomyopathy (EF 45%), PAF (not on anticoagulation due to GI bleed), HTN, DM, hyperlipidemia, PE (2016 after TKA), TIA, AAA (s/p EVAR 08/31/16), GI bleed. Uses home O2. Former smoker. BMI 32.   Medications include: Amlodipine, ASA 81 mg, Plavix, Aricept, Pepcid, Lasix, losartan, metformin, metoprolol, Novolin N, Victoza. Pt to stop plavix 2 days before surgery per Dr. Prescott Gum.   Preoperative labs reviewed. H/H 9.9/34.2. Dr. Prescott Gum is aware.   CXR 04/06/17: 1. No acute cardiopulmonary disease. 2. Chronic calcified right pleural plaque and focal scarring in the right lobe base.  EKG 04/06/17: NSR. Non-specific intra-ventricular conduction delay. Moderate voltage criteria for LVH, may be normal variant  - CT angio neck 03/14/17:  1. Unexpected finding of new right peritracheal lymphadenopathy since a 2016 chest CTA. This is suspicious for metastatic bronchogenic carcinoma in conjunction with a partially visible spiculated right lung nodule (series 6, image 11). Recommend Thoracic Surgery referral and/or follow-up PET-CT. 2. Short segment high-grade left ICA stenosis just distal to the bulb, best demonstrated on series 9, image 133.  Stenosis is numerically estimated at 70-75%. 3. Superimposed predominately soft atherosclerotic plaque of the aorta and carotid arteries with multiple areas of ulcerated plaque (including distal arch, distal right CCA and proximal right ICA). Note also fusiform enlargement of the cervical right ICA at the C1 level (up to 11 mm diameter). 4. Mild to moderate bilateral vertebral artery origin and/or V4 segment atherosclerotic stenosis.  Carotid duplex 03/01/17:  - L ICA 80-99% stensosis - R ICA >50% stenosis  Cardiac cath 12/05/16:   Posterolateral artery, 90% stenosed. DES successfully placed. Post intervention, there is a 0% residual stenosis.  Ectatic RCA. Mid RCA lesion, 95% stenosed.DES successfully placed. Post intervention, there is a 0% residual stenosis.  Mid Cx lesion, 80% stenosed. This is a relatively small vessel.  Ost Cx lesion, 50% stenosed.  Ost LAD to Mid LAD lesion, 25% stenosed.  RPDA lesion, 60-70% stenosed. Given our efforts to limit contrast dye, we did not further evaluate this lesion.  LV end diastolic pressure is normal.  There is no pulmonic valve stenosis.  Normal right heart pressures.  PA sat 68%. CO 5.9 L/min. CI 2.67. - He will need dual antiplatelet therapy for 12 months.  In speaking to the wife, she mentioned that if anticoagulation was needed, COumadin would be preferred.  If he is started on Coumadin or any other anticoagulation for PAF, would change Brilinta to Plavix.  Would give Plavix 300 mg on the first day followed by 75 mg daily. - Very large RCA.  Hopefully, revascularization will help his overall EF. There is some significant disease in a relatively small CX. I think this would be suitable for medical therapy. Moderate lesion in the mid PDA. This was deferred to  avoid giving excessive contrast. This could be considered for intervention in the future if required.   Echo 12/04/16:  - Left ventricle: The cavity size was normal. Wall thickness was  increased in a pattern of moderate LVH. Systolic function was mildly to moderately reduced. The estimated ejection fraction was 45%. Diffuse hypokinesis. Doppler parameters are consistent with abnormal left ventricular relaxation (grade 1 diastolic dysfunction). - Aortic valve: Mildly calcified annulus. Trileaflet; mildly calcified leaflets. There was trivial regurgitation. - Mitral valve: Calcified annulus. There was trivial regurgitation. - Left atrium: The atrium was mildly dilated. - Right atrium: Central venous pressure (est): 3 mm Hg. - Atrial septum: No defect or patent foramen ovale was identified. - Tricuspid valve: There was trivial regurgitation. - Pulmonary arteries: PA peak pressure: 31 mm Hg (S). - Pericardium, extracardiac: There was no pericardial effusion. - Impressions: Moderate LVH with LVEF approximately 45%. Overall diffuse hypokinesis. Grade 1 diastolic dysfunction. Mild left atrial enlargement. Mildly calcified mitral annulus with trivial mitral regurgitation. Sclerotic aortic valve with trivial aortic regurgitation. Trivial tricuspid regurgitation with PASP estimated 31 mmHg.  If no changes, I anticipate pt can proceed with surgery as scheduled.   Willeen Cass, FNP-BC [facility]/Anesthesiology Phone: [phone] 04/10/2017 9:32 AM

## 2017-04-11 ENCOUNTER — Ambulatory Visit (HOSPITAL_COMMUNITY)
Admission: RE | Admit: 2017-04-11 | Discharge: 2017-04-11 | Disposition: A | Discharge status: Home / Self Care | Payer: Medicare HMO | Source: Ambulatory Visit | Attending: Cardiothoracic Surgery | Admitting: Cardiothoracic Surgery

## 2017-04-11 ENCOUNTER — Encounter (HOSPITAL_COMMUNITY)
Admission: RE | Disposition: A | Discharge status: Home / Self Care | Payer: Self-pay | Source: Ambulatory Visit | Attending: Cardiothoracic Surgery

## 2017-04-11 ENCOUNTER — Ambulatory Visit (HOSPITAL_COMMUNITY): Payer: Medicare HMO | Admitting: Certified Registered Nurse Anesthetist

## 2017-04-11 ENCOUNTER — Ambulatory Visit (HOSPITAL_COMMUNITY): Payer: Medicare HMO

## 2017-04-11 ENCOUNTER — Other Ambulatory Visit: Payer: Self-pay | Admitting: *Deleted

## 2017-04-11 ENCOUNTER — Encounter (HOSPITAL_COMMUNITY): Payer: Self-pay | Admitting: *Deleted

## 2017-04-11 ENCOUNTER — Ambulatory Visit (HOSPITAL_COMMUNITY): Payer: Medicare HMO | Admitting: Emergency Medicine

## 2017-04-11 DIAGNOSIS — Z888 Allergy status to other drugs, medicaments and biological substances status: Secondary | ICD-10-CM | POA: Insufficient documentation

## 2017-04-11 DIAGNOSIS — Z86711 Personal history of pulmonary embolism: Secondary | ICD-10-CM | POA: Diagnosis not present

## 2017-04-11 DIAGNOSIS — I429 Cardiomyopathy, unspecified: Secondary | ICD-10-CM | POA: Insufficient documentation

## 2017-04-11 DIAGNOSIS — E782 Mixed hyperlipidemia: Secondary | ICD-10-CM | POA: Insufficient documentation

## 2017-04-11 DIAGNOSIS — Z9981 Dependence on supplemental oxygen: Secondary | ICD-10-CM | POA: Insufficient documentation

## 2017-04-11 DIAGNOSIS — Z880 Allergy status to penicillin: Secondary | ICD-10-CM | POA: Insufficient documentation

## 2017-04-11 DIAGNOSIS — F0390 Unspecified dementia without behavioral disturbance: Secondary | ICD-10-CM | POA: Insufficient documentation

## 2017-04-11 DIAGNOSIS — Z87891 Personal history of nicotine dependence: Secondary | ICD-10-CM | POA: Insufficient documentation

## 2017-04-11 DIAGNOSIS — Z794 Long term (current) use of insulin: Secondary | ICD-10-CM | POA: Insufficient documentation

## 2017-04-11 DIAGNOSIS — E559 Vitamin D deficiency, unspecified: Secondary | ICD-10-CM | POA: Insufficient documentation

## 2017-04-11 DIAGNOSIS — R590 Localized enlarged lymph nodes: Secondary | ICD-10-CM | POA: Diagnosis not present

## 2017-04-11 DIAGNOSIS — I480 Paroxysmal atrial fibrillation: Secondary | ICD-10-CM | POA: Insufficient documentation

## 2017-04-11 DIAGNOSIS — Z79899 Other long term (current) drug therapy: Secondary | ICD-10-CM | POA: Diagnosis not present

## 2017-04-11 DIAGNOSIS — Z885 Allergy status to narcotic agent status: Secondary | ICD-10-CM | POA: Insufficient documentation

## 2017-04-11 DIAGNOSIS — E1151 Type 2 diabetes mellitus with diabetic peripheral angiopathy without gangrene: Secondary | ICD-10-CM | POA: Diagnosis not present

## 2017-04-11 DIAGNOSIS — C3411 Malignant neoplasm of upper lobe, right bronchus or lung: Secondary | ICD-10-CM | POA: Diagnosis not present

## 2017-04-11 DIAGNOSIS — Z7982 Long term (current) use of aspirin: Secondary | ICD-10-CM | POA: Diagnosis not present

## 2017-04-11 DIAGNOSIS — R911 Solitary pulmonary nodule: Secondary | ICD-10-CM

## 2017-04-11 DIAGNOSIS — Z8673 Personal history of transient ischemic attack (TIA), and cerebral infarction without residual deficits: Secondary | ICD-10-CM | POA: Diagnosis not present

## 2017-04-11 DIAGNOSIS — R918 Other nonspecific abnormal finding of lung field: Secondary | ICD-10-CM | POA: Diagnosis present

## 2017-04-11 DIAGNOSIS — Z818 Family history of other mental and behavioral disorders: Secondary | ICD-10-CM | POA: Diagnosis not present

## 2017-04-11 DIAGNOSIS — Z823 Family history of stroke: Secondary | ICD-10-CM | POA: Diagnosis not present

## 2017-04-11 DIAGNOSIS — Z9889 Other specified postprocedural states: Secondary | ICD-10-CM | POA: Diagnosis not present

## 2017-04-11 DIAGNOSIS — Z7901 Long term (current) use of anticoagulants: Secondary | ICD-10-CM | POA: Insufficient documentation

## 2017-04-11 DIAGNOSIS — G4733 Obstructive sleep apnea (adult) (pediatric): Secondary | ICD-10-CM | POA: Insufficient documentation

## 2017-04-11 DIAGNOSIS — J449 Chronic obstructive pulmonary disease, unspecified: Secondary | ICD-10-CM | POA: Diagnosis not present

## 2017-04-11 DIAGNOSIS — I10 Essential (primary) hypertension: Secondary | ICD-10-CM | POA: Diagnosis not present

## 2017-04-11 DIAGNOSIS — Z96653 Presence of artificial knee joint, bilateral: Secondary | ICD-10-CM | POA: Insufficient documentation

## 2017-04-11 HISTORY — PX: VIDEO BRONCHOSCOPY WITH ENDOBRONCHIAL ULTRASOUND: SHX6177

## 2017-04-11 LAB — GLUCOSE, CAPILLARY
Glucose-Capillary: 143 mg/dL — ABNORMAL HIGH (ref 65–99)
Glucose-Capillary: 115 mg/dL — ABNORMAL HIGH (ref 65–99)

## 2017-04-11 LAB — TYPE AND SCREEN
ABO/RH(D): A NEG
Antibody Screen: NEGATIVE

## 2017-04-11 SURGERY — BRONCHOSCOPY, WITH EBUS
Anesthesia: General

## 2017-04-11 MED ORDER — ACETAMINOPHEN 325 MG PO TABS: 650.0000 mg | ORAL_TABLET | ORAL | Status: DC | PRN

## 2017-04-11 MED ORDER — LIDOCAINE HCL (CARDIAC) 20 MG/ML IV SOLN
INTRAVENOUS | Status: DC | PRN
  Administered 2017-04-11: 60 mg via INTRAVENOUS
  Administered 2017-04-11: 50 mg via INTRATRACHEAL

## 2017-04-11 MED ORDER — ROCURONIUM BROMIDE 100 MG/10ML IV SOLN
INTRAVENOUS | Status: DC | PRN
  Administered 2017-04-11: 30 mg via INTRAVENOUS
  Administered 2017-04-11: 50 mg via INTRAVENOUS

## 2017-04-11 MED ORDER — SODIUM CHLORIDE 0.9 % IV SOLN: 250.0000 mL | INTRAVENOUS | Status: DC | PRN

## 2017-04-11 MED ORDER — DEXTROSE 5 % IV SOLN
INTRAVENOUS | Status: DC | PRN
  Administered 2017-04-11: 25 ug/min via INTRAVENOUS

## 2017-04-11 MED ORDER — FENTANYL CITRATE (PF) 250 MCG/5ML IJ SOLN
INTRAMUSCULAR | Status: AC
  Filled 2017-04-11: qty 5

## 2017-04-11 MED ORDER — SUGAMMADEX SODIUM 200 MG/2ML IV SOLN
INTRAVENOUS | Status: DC | PRN
  Administered 2017-04-11: 300 mg via INTRAVENOUS

## 2017-04-11 MED ORDER — ROCURONIUM BROMIDE 10 MG/ML (PF) SYRINGE
PREFILLED_SYRINGE | INTRAVENOUS | Status: AC
  Filled 2017-04-11: qty 5

## 2017-04-11 MED ORDER — ACETAMINOPHEN 500 MG PO TABS: 1000.0000 mg | ORAL_TABLET | Freq: Four times a day (QID) | ORAL | Status: DC

## 2017-04-11 MED ORDER — FENTANYL CITRATE (PF) 100 MCG/2ML IJ SOLN
INTRAMUSCULAR | Status: DC | PRN
  Administered 2017-04-11: 50 ug via INTRAVENOUS

## 2017-04-11 MED ORDER — LACTATED RINGERS IV SOLN
INTRAVENOUS | Status: DC | PRN
  Administered 2017-04-11: 07:00:00 via INTRAVENOUS

## 2017-04-11 MED ORDER — LIDOCAINE 2% (20 MG/ML) 5 ML SYRINGE
INTRAMUSCULAR | Status: AC
  Filled 2017-04-11: qty 10

## 2017-04-11 MED ORDER — PROPOFOL 10 MG/ML IV BOLUS
INTRAVENOUS | Status: AC
  Filled 2017-04-11: qty 40

## 2017-04-11 MED ORDER — SODIUM CHLORIDE 0.9% FLUSH: 3.0000 mL | INTRAVENOUS | Status: DC | PRN

## 2017-04-11 MED ORDER — SUGAMMADEX SODIUM 500 MG/5ML IV SOLN
INTRAVENOUS | Status: AC
  Filled 2017-04-11: qty 5

## 2017-04-11 MED ORDER — FUROSEMIDE 10 MG/ML IJ SOLN
40.0000 mg | Freq: Once | INTRAMUSCULAR | Status: AC
  Administered 2017-04-11: 40 mg via INTRAVENOUS
  Filled 2017-04-11: qty 4

## 2017-04-11 MED ORDER — CHLORHEXIDINE GLUCONATE 4 % EX LIQD: 60.0000 mL | Freq: Once | CUTANEOUS | Status: DC

## 2017-04-11 MED ORDER — 0.9 % SODIUM CHLORIDE (POUR BTL) OPTIME
TOPICAL | Status: DC | PRN
  Administered 2017-04-11: 1000 mL

## 2017-04-11 MED ORDER — ONDANSETRON HCL 4 MG/2ML IJ SOLN
INTRAMUSCULAR | Status: DC | PRN
  Administered 2017-04-11: 4 mg via INTRAVENOUS

## 2017-04-11 MED ORDER — ONDANSETRON HCL 4 MG/2ML IJ SOLN
INTRAMUSCULAR | Status: AC
  Filled 2017-04-11: qty 2

## 2017-04-11 MED ORDER — ACETAMINOPHEN 650 MG RE SUPP: 650.0000 mg | RECTAL | Status: DC | PRN

## 2017-04-11 MED ORDER — MIDAZOLAM HCL 5 MG/5ML IJ SOLN
INTRAMUSCULAR | Status: DC | PRN
  Administered 2017-04-11: 1 mg via INTRAVENOUS

## 2017-04-11 MED ORDER — PROPOFOL 10 MG/ML IV BOLUS
INTRAVENOUS | Status: DC | PRN
  Administered 2017-04-11: 130 mg via INTRAVENOUS
  Administered 2017-04-11: 30 mg via INTRAVENOUS

## 2017-04-11 MED ORDER — MIDAZOLAM HCL 2 MG/2ML IJ SOLN
INTRAMUSCULAR | Status: AC
  Filled 2017-04-11: qty 2

## 2017-04-11 MED ORDER — PROMETHAZINE HCL 25 MG/ML IJ SOLN: 6.2500 mg | INTRAMUSCULAR | Status: DC | PRN

## 2017-04-11 MED ORDER — HYDROMORPHONE HCL 1 MG/ML IJ SOLN: 0.2500 mg | INTRAMUSCULAR | Status: DC | PRN

## 2017-04-11 MED ORDER — SODIUM CHLORIDE 0.9% FLUSH: 3.0000 mL | Freq: Two times a day (BID) | INTRAVENOUS | Status: DC

## 2017-04-11 MED ORDER — FUROSEMIDE 10 MG/ML IJ SOLN
INTRAMUSCULAR | Status: AC
  Filled 2017-04-11: qty 4

## 2017-04-11 SURGICAL SUPPLY — 65 items
BLADE SURG 10 STRL SS (BLADE) ×3 IMPLANT
BLADE SURG 15 STRL LF DISP TIS (BLADE) ×1 IMPLANT
BLADE SURG 15 STRL SS (BLADE) ×2
BRUSH CYTOL CELLEBRITY 1.5X140 (MISCELLANEOUS) ×3 IMPLANT
CANISTER SUCT 3000ML PPV (MISCELLANEOUS) ×6 IMPLANT
CLIP TI MEDIUM 6 (CLIP) IMPLANT
CLIP TI WIDE RED SMALL 6 (CLIP) IMPLANT
CONT SPEC 4OZ CLIKSEAL STRL BL (MISCELLANEOUS) ×9 IMPLANT
COTTONBALL LRG STERILE PKG (GAUZE/BANDAGES/DRESSINGS) IMPLANT
COVER BACK TABLE 60X90IN (DRAPES) ×3 IMPLANT
COVER DOME SNAP 22 D (MISCELLANEOUS) ×3 IMPLANT
COVER SURGICAL LIGHT HANDLE (MISCELLANEOUS) ×6 IMPLANT
DERMABOND ADVANCED (GAUZE/BANDAGES/DRESSINGS) ×2
DERMABOND ADVANCED .7 DNX12 (GAUZE/BANDAGES/DRESSINGS) ×1 IMPLANT
DRAPE LAPAROTOMY T 102X78X121 (DRAPES) ×3 IMPLANT
DRSG AQUACEL AG ADV 3.5X14 (GAUZE/BANDAGES/DRESSINGS) ×3 IMPLANT
ELECT CAUTERY BLADE 6.4 (BLADE) ×3 IMPLANT
ELECT REM PT RETURN 9FT ADLT (ELECTROSURGICAL) ×3
ELECTRODE REM PT RTRN 9FT ADLT (ELECTROSURGICAL) ×1 IMPLANT
FORCEPS BIOP RJ4 1.8 (CUTTING FORCEPS) IMPLANT
GAUZE SPONGE 4X4 12PLY STRL (GAUZE/BANDAGES/DRESSINGS) ×3 IMPLANT
GAUZE SPONGE 4X4 16PLY XRAY LF (GAUZE/BANDAGES/DRESSINGS) ×3 IMPLANT
GLOVE BIO SURGEON STRL SZ7.5 (GLOVE) ×9 IMPLANT
GOWN STRL REUS W/ TWL LRG LVL3 (GOWN DISPOSABLE) ×2 IMPLANT
GOWN STRL REUS W/TWL LRG LVL3 (GOWN DISPOSABLE) ×4
HEMOSTAT SURGICEL 2X14 (HEMOSTASIS) IMPLANT
KIT BASIN OR (CUSTOM PROCEDURE TRAY) ×3 IMPLANT
KIT CLEAN ENDO COMPLIANCE (KITS) ×9 IMPLANT
KIT ROOM TURNOVER OR (KITS) ×6 IMPLANT
MARKER SKIN DUAL TIP RULER LAB (MISCELLANEOUS) ×3 IMPLANT
NEEDLE 22X1 1/2 (OR ONLY) (NEEDLE) IMPLANT
NEEDLE BLUNT 18X1 FOR OR ONLY (NEEDLE) IMPLANT
NEEDLE EBUS SONO TIP PENTAX (NEEDLE) ×3 IMPLANT
NS IRRIG 1000ML POUR BTL (IV SOLUTION) ×6 IMPLANT
OIL SILICONE PENTAX (PARTS (SERVICE/REPAIRS)) ×3 IMPLANT
PACK SURGICAL SETUP 50X90 (CUSTOM PROCEDURE TRAY) ×3 IMPLANT
PAD ARMBOARD 7.5X6 YLW CONV (MISCELLANEOUS) ×12 IMPLANT
PENCIL BUTTON HOLSTER BLD 10FT (ELECTRODE) ×3 IMPLANT
SOLUTION ANTI FOG 6CC (MISCELLANEOUS) ×6 IMPLANT
SPONGE INTESTINAL PEANUT (DISPOSABLE) IMPLANT
STAPLER VISISTAT 35W (STAPLE) IMPLANT
SUT SILK 2 0 TIES 10X30 (SUTURE) IMPLANT
SUT VIC AB 2-0 CT1 27 (SUTURE) ×2
SUT VIC AB 2-0 CT1 TAPERPNT 27 (SUTURE) ×1 IMPLANT
SUT VIC AB 3-0 SH 27 (SUTURE) ×2
SUT VIC AB 3-0 SH 27X BRD (SUTURE) ×1 IMPLANT
SUT VIC AB 3-0 SH 8-18 (SUTURE) ×3 IMPLANT
SUT VIC AB 3-0 X1 27 (SUTURE) ×3 IMPLANT
SWAB COLLECTION DEVICE MRSA (MISCELLANEOUS) IMPLANT
SWAB CULTURE ESWAB REG 1ML (MISCELLANEOUS) IMPLANT
SYR 10ML LL (SYRINGE) ×3 IMPLANT
SYR 20CC LL (SYRINGE) ×3 IMPLANT
SYR 20ML ECCENTRIC (SYRINGE) ×3 IMPLANT
SYR 5ML LUER SLIP (SYRINGE) ×3 IMPLANT
SYR CONTROL 10ML LL (SYRINGE) IMPLANT
TOWEL GREEN STERILE (TOWEL DISPOSABLE) ×4 IMPLANT
TOWEL GREEN STERILE FF (TOWEL DISPOSABLE) ×6 IMPLANT
TOWEL OR 17X24 6PK STRL BLUE (TOWEL DISPOSABLE) ×3 IMPLANT
TOWEL OR 17X26 10 PK STRL BLUE (TOWEL DISPOSABLE) ×6 IMPLANT
TRAP SPECIMEN MUCOUS 40CC (MISCELLANEOUS) ×3 IMPLANT
TUBE CONNECTING 12'X1/4 (SUCTIONS) ×2
TUBE CONNECTING 12X1/4 (SUCTIONS) ×4 IMPLANT
TUBE CONNECTING 20'X1/4 (TUBING) ×2
TUBE CONNECTING 20X1/4 (TUBING) ×4 IMPLANT
WATER STERILE IRR 1000ML POUR (IV SOLUTION) ×6 IMPLANT

## 2017-04-11 NOTE — Anesthesia Postprocedure Evaluation (Signed)
Anesthesia Post Note  Patient: Rodney Orozco  Procedure(s) Performed: Procedure(s) (LRB): VIDEO BRONCHOSCOPY WITH ENDOBRONCHIAL ULTRASOUND (N/A)  Patient location during evaluation: PACU Anesthesia Type: General Level of consciousness: awake and alert Pain management: pain level controlled Vital Signs Assessment: post-procedure vital signs reviewed and stable Respiratory status: spontaneous breathing, nonlabored ventilation, respiratory function stable and patient connected to nasal cannula oxygen Cardiovascular status: blood pressure returned to baseline and stable Postop Assessment: no signs of nausea or vomiting Anesthetic complications: no       Last Vitals:  Vitals:   04/11/17 0953 04/11/17 1005  BP: 132/70   Pulse: 87 87  Resp: (!) 23 (!) 25  Temp:  36.1 C    Last Pain:  Vitals:   04/11/17 0650  TempSrc: Oral                 Jaecion Dempster S

## 2017-04-11 NOTE — Progress Notes (Signed)
The patient was examined and preop studies reviewed. There has been no change from the prior exam and the patient is ready for surgery.  plan videobronchoscopy, EBUS and mediastinoscopy on [address]

## 2017-04-11 NOTE — Brief Op Note (Signed)
04/11/2017  9:13 AM  PATIENT:  Rodney Orozco  76 y.o. male  PRE-OPERATIVE DIAGNOSIS:  RIGHT LUNG NODULE  POST-OPERATIVE DIAGNOSIS:  RIGHT LUNG NODULE  PROCEDURE:  Procedure(s): VIDEO BRONCHOSCOPY WITH ENDOBRONCHIAL ULTRASOUND (N/A) Brushings of RUL Trans bronchial needle aspiration of 4R node SURGEON:  Surgeon(s) and Role:    Ivin Poot, MD - Primary  PHYSICIAN ASSISTANT:   ASSISTANTS: none   ANESTHESIA:   general  EBL:  No intake/output data recorded.  BLOOD ADMINISTERED:none  DRAINS: none   LOCAL MEDICATIONS USED:  NONE  SPECIMEN:  Aspirate and Lavage/Washing  DISPOSITION OF SPECIMEN:  PATHOLOGY      Quick prep cytology- RUL brushings- non small cell cancer, 4R node quick prep -atypical cells  COUNTS:  YES  TOURNIQUET:  * No tourniquets in log *  DICTATION: .Dragon Dictation  PLAN OF CARE: Discharge to home after PACU  PATIENT DISPOSITION:  PACU - hemodynamically stable.   Delay start of Pharmacological VTE agent (>24hrs) due to surgical blood loss or risk of bleeding: yes

## 2017-04-11 NOTE — Anesthesia Procedure Notes (Signed)
Procedure Name: Intubation Date/Time: 04/11/2017 7:42 AM Performed by: Valda Favia Pre-anesthesia Checklist: Patient identified, Emergency Drugs available, Suction available, Patient being monitored and Timeout performed Patient Re-evaluated:Patient Re-evaluated prior to inductionOxygen Delivery Method: Circle system utilized Preoxygenation: Pre-oxygenation with 100% oxygen Intubation Type: IV induction Ventilation: Mask ventilation without difficulty and Oral airway inserted - appropriate to patient size Laryngoscope Size: Mac and 4 Grade View: Grade I Tube type: Oral Tube size: 8.5 mm Number of attempts: 1 Airway Equipment and Method: Stylet and LTA kit utilized Placement Confirmation: ETT inserted through vocal cords under direct vision,  positive ETCO2 and breath sounds checked- equal and bilateral Secured at: 23 cm Tube secured with: Tape Dental Injury: Teeth and Oropharynx as per pre-operative assessment

## 2017-04-11 NOTE — Transfer of Care (Signed)
Immediate Anesthesia Transfer of Care Note  Patient: Rodney Orozco  Procedure(s) Performed: Procedure(s): VIDEO BRONCHOSCOPY WITH ENDOBRONCHIAL ULTRASOUND (N/A)  Patient Location: PACU  Anesthesia Type:General  Level of Consciousness: awake, alert  and oriented  Airway & Oxygen Therapy: Patient Spontanous Breathing and Patient connected to face mask oxygen  Post-op Assessment: Report given to RN and Post -op Vital signs reviewed and stable  Post vital signs: Reviewed and stable  Last Vitals:  Vitals:   04/11/17 0650 04/11/17 0921  BP: (!) 156/74   Pulse: 80   Resp: 20   Temp: 36.8 C 36.4 C    Last Pain:  Vitals:   04/11/17 0650  TempSrc: Oral      Patients Stated Pain Goal: 4 (86/76/19 5093)  Complications: No apparent anesthesia complications

## 2017-04-11 NOTE — Discharge Instructions (Signed)
1. It is normal to cough up small amounts of blood 2. Diet- as tolerated 3. Resume Plavix tomorrow 5/31 4. Rest today, up and ambulate and activity as tolerated tomorrow

## 2017-04-11 NOTE — Anesthesia Preprocedure Evaluation (Signed)
Anesthesia Evaluation  Patient identified by MRN, date of birth, ID band Patient awake    Reviewed: Allergy & Precautions, NPO status , Patient's Chart, lab work & pertinent test results  Airway Mallampati: II  TM Distance: >3 FB Neck ROM: Full    Dental no notable dental hx.    Pulmonary asthma , COPD,  oxygen dependent, former smoker,    Pulmonary exam normal breath sounds clear to auscultation       Cardiovascular hypertension, + Peripheral Vascular Disease  Normal cardiovascular exam+ dysrhythmias Atrial Fibrillation  Rhythm:Regular Rate:Normal     Neuro/Psych negative neurological ROS  negative psych ROS   GI/Hepatic negative GI ROS, Neg liver ROS,   Endo/Other  negative endocrine ROSdiabetes  Renal/GU negative Renal ROS  negative genitourinary   Musculoskeletal negative musculoskeletal ROS (+)   Abdominal   Peds negative pediatric ROS (+)  Hematology negative hematology ROS (+)   Anesthesia Other Findings   Reproductive/Obstetrics negative OB ROS                             Anesthesia Physical Anesthesia Plan  ASA: III  Anesthesia Plan: General   Post-op Pain Management:    Induction: Intravenous  Airway Management Planned: Oral ETT  Additional Equipment:   Intra-op Plan:   Post-operative Plan: Possible Post-op intubation/ventilation  Informed Consent: I have reviewed the patients History and Physical, chart, labs and discussed the procedure including the risks, benefits and alternatives for the proposed anesthesia with the patient or authorized representative who has indicated his/her understanding and acceptance.   Dental advisory given  Plan Discussed with: CRNA and Surgeon  Anesthesia Plan Comments:         Anesthesia Quick Evaluation

## 2017-04-11 NOTE — Op Note (Signed)
Rodney Orozco, Rodney Orozco NO.:  [id number]  MEDICAL RECORD NO.:  [id number]  LOCATION:                                 FACILITY:  PHYSICIAN:  Ivin Poot, M.D.  DATE OF BIRTH:  August 02, 1941  DATE OF PROCEDURE: DATE OF DISCHARGE:                              OPERATIVE REPORT   OPERATION: 1. Video bronchoscopy with brushings and washings of right upper lobe. 2. Ultrasound guided transbronchial biopsy of 4R mediastinal lymph     node.  SURGEON:  Ivin Poot, M.D.  ANESTHESIA:  General.  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  CLINICAL NOTE:  The patient is a 76 year old gentleman, smoker with peripheral vascular disease.  He is being evaluated for a stent to the carotid artery.  A CT scan for that purpose demonstrated a right upper lobe lung mass.  A subsequent PET scan showed both a right upper lobe mass and mediastinal paratracheal adenopathy to be hypermetabolic, consistent with probable lung cancer.  He was referred for thoracic surgical evaluation.  I recommended bronchoscopy and biopsy to establish a tissue diagnosis.  I discussed the procedure of bronchoscopy and biopsy with the patient and the family.  I also discussed the potential need for mediastinoscopy if bronchoscopic biopsies were not definitive. I discussed the main risks to be bleeding and the patient was to stop his Plavix for 4 days prior to the procedure.  DESCRIPTION OF PROCEDURE:  The patient was brought to the operating room and placed supine on the operating room table.  General anesthesia was induced.  A proper time-out  was performed.  Through the endotracheal tube, a video bronchoscope was passed.  The distal trachea and carina were examined.  No endobronchial lesions were noted.  The left mainstem bronchus and the endobronchial segments of the left upper lobe and left lower lobe were all visualized.  No lesions were identified.  The patient did have excess of clear airway secretions probably related to his smoking history.  The bronchoscope was passed down the right mainstem bronchus. Endobronchial segments of the right upper lobe, right middle lobe, and right lower lobe were visualized and no visible lesions were noted. Brushings were taken of the right upper lobe and sent for cytology. There was minimal bleeding.  The bronchoscope was then withdrawn and exchanged for the EBUS scope.  The EBUS scope was passed to the carina. The mediastinal node in the 4R position was identified using ultrasound. Several transbronchial needle aspirates were obtained.  Some of these were sent for quick prep, which showed atypical cells.  The remainder of the several biopsy aspirates was sent for permanent cellular preparation and fixation.  There was minimal bleeding.  The bronchoscope was withdrawn.  Since the quick prep of the brushings of the right lung were positive for non-small cell carcinoma, the mediastinoscopy was not performed. The patient was reversed  from anesthesia and returned to recovery room.     Ivin Poot, M.D.   ______________________________ Ivin Poot, M.D.    PV/MEDQ  D:  04/11/2017  T:  04/11/2017  Job:  [id number]  cc:   Frann Rider, M.D.

## 2017-04-11 NOTE — Op Note (Deleted)
  The note originally documented on this encounter has been moved the the encounter in which it belongs.  

## 2017-04-12 ENCOUNTER — Encounter: Payer: Self-pay | Admitting: *Deleted

## 2017-04-12 ENCOUNTER — Encounter (HOSPITAL_COMMUNITY): Payer: Self-pay | Admitting: Cardiothoracic Surgery

## 2017-04-12 ENCOUNTER — Telehealth: Payer: Self-pay | Admitting: *Deleted

## 2017-04-12 DIAGNOSIS — R918 Other nonspecific abnormal finding of lung field: Secondary | ICD-10-CM | POA: Insufficient documentation

## 2017-04-12 NOTE — Progress Notes (Signed)
Oncology Nurse Navigator Documentation  Oncology Nurse Navigator Flowsheets 04/12/2017  Navigator Location [facility]  Navigator Encounter Type Letter/Fax/Email/MTOC letter sent  Treatment Phase Pre-Tx/Tx Discussion  Barriers/Navigation Needs Education  Education Other  Interventions Education  Education Method Written  Acuity Level 1  Time Spent with Patient 15

## 2017-04-12 NOTE — Telephone Encounter (Signed)
Oncology Nurse Navigator Documentation  Oncology Nurse Navigator Flowsheets 04/12/2017  Navigator Location [facility]  Referral date to RadOnc/MedOnc 04/11/2017  Navigator Encounter Type Telephone/I received referral on Rodney Orozco yesterday.  I called him today and scheduled him to be seen next week at [facility] 04/19/17 arrive at 12:30.  Rodney Orozco verbalized understanding of appt time and place.   Telephone Outgoing Call  Barriers/Navigation Needs Coordination of Care  Interventions Coordination of Care  Acuity Level 2  Time Spent with Patient 15

## 2017-04-16 ENCOUNTER — Encounter: Payer: Self-pay | Admitting: Vascular Surgery

## 2017-04-16 NOTE — Addendum Note (Signed)
Addendum  created 04/16/17 1402 by Myrtie Soman, MD   Sign clinical note

## 2017-04-16 NOTE — Anesthesia Postprocedure Evaluation (Signed)
Anesthesia Post Note  Patient: Rodney Orozco  Procedure(s) Performed: Procedure(s) (LRB): VIDEO BRONCHOSCOPY WITH ENDOBRONCHIAL ULTRASOUND (N/A)     Anesthesia Post Evaluation  Last Vitals:  Vitals:   04/11/17 1100 04/11/17 1215  BP:    Pulse: 80 75  Resp:    Temp:      Last Pain:  Vitals:   04/11/17 0650  TempSrc: Oral                 Kemiyah Tarazon S

## 2017-04-18 ENCOUNTER — Ambulatory Visit (HOSPITAL_COMMUNITY)
Admission: RE | Admit: 2017-04-18 | Discharge: 2017-04-18 | Disposition: A | Discharge status: Home / Self Care | Payer: Medicare HMO | Source: Ambulatory Visit | Attending: Vascular Surgery | Admitting: Vascular Surgery

## 2017-04-18 ENCOUNTER — Encounter: Payer: Self-pay | Admitting: Vascular Surgery

## 2017-04-18 ENCOUNTER — Ambulatory Visit (INDEPENDENT_AMBULATORY_CARE_PROVIDER_SITE_OTHER): Payer: Medicare HMO | Admitting: Vascular Surgery

## 2017-04-18 VITALS — BP 152/76 | HR 85 | Temp 97.1°F | Resp 16 | Ht 70.0 in | Wt 224.7 lb

## 2017-04-18 DIAGNOSIS — I714 Abdominal aortic aneurysm, without rupture, unspecified: Secondary | ICD-10-CM

## 2017-04-18 DIAGNOSIS — Z48812 Encounter for surgical aftercare following surgery on the circulatory system: Secondary | ICD-10-CM | POA: Diagnosis not present

## 2017-04-18 NOTE — Progress Notes (Addendum)
Patient name: Rodney Orozco MRN: [id number] DOB: 03-21-1941 Sex: male  REASON FOR VISIT:    Follow up after endovascular aneurysm repair  HPI:   Rodney Orozco is a 76 y.o. 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 He comes in for a 6 month follow up visit. He denies any abdominal pain or back pain.  The patient had a CT angiogram of the neck to workup a left carotid stenosis and an incidental finding was a spiculated right lung mass.  He underwent video bronchoscopy and ultrasound-guided transbronchial biopsy by Dr. Prescott Gum on 04/11/2017. 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 He has an appointment with oncology tomorrow.  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 I favored carotid stenting. He was scheduled to see Dr. Quay Burow, however, given the issue with the lung cancer the wife would prefer to have this addressed first before considering carotid stenting.  He denies any new neurologic symptoms. Specifically, he denies stroke, TIA, expressive or receptive aphasia, or amaurosis fugax. He is on aspirin and is on Plavix.   Family History  Problem Relation Age of Onset  . Dementia Mother   . Stroke Mother   . Heart attack Father     SOCIAL HISTORY: Social History  Substance Use Topics  . Smoking status: Former Smoker    Packs/day: 2.00    Years: 50.00    Types: Cigarettes    Quit date: 11/07/2013  . Smokeless tobacco: Never Used  . Alcohol use No    Allergies  Allergen Reactions  . Codeine Other (See Comments)    Hallucinations  . Tramadol Dermatitis  . Warfarin And Related     Rectal bleeding   . Lipitor [Atorvastatin] Other (See Comments)    Joint pain  . Penicillins Rash    HIves/rash Has patient had a PCN reaction causing immediate rash,  facial/tongue/throat swelling, SOB or lightheadedness with hypotension: Yes Has patient had a PCN reaction causing severe rash involving mucus membranes or skin necrosis: No Has patient had a PCN reaction that required hospitalization No Has patient had a PCN reaction occurring within the last 10 years: No If all of the above answers are "NO", then may proceed with Cephalosporin use.     Current Outpatient Prescriptions  Medication Sig Dispense Refill  . acetaminophen (TYLENOL) 500 MG tablet Take 1,000 mg by mouth 2 (two) times daily as needed for headache.    Marland Kitchen amLODipine (NORVASC) 5 MG tablet Take 1 tablet (5 mg total) by mouth daily. 30 tablet 12  . aspirin EC 81 MG tablet Take 1 tablet (81 mg total) by mouth daily. 90 tablet 3  . clopidogrel (PLAVIX) 75 MG tablet Take 1 tablet (75 mg total) by mouth daily. 30 tablet 12  . donepezil (ARICEPT) 10 MG tablet Take 1 tablet (10 mg total) by mouth at bedtime. 30 tablet 12  . famotidine (PEPCID) 20 MG tablet Take 20 mg by mouth 2 (two) times daily.     . flintstones complete (FLINTSTONES) 60 MG chewable tablet Chew 1 tablet by mouth 2 (two) times daily.    . furosemide (LASIX) 40 MG tablet Take 1 tablet (40 mg total) by mouth daily. 30 tablet   . losartan (COZAAR) 100 MG tablet Take 100 mg by mouth daily.    Marland Kitchen  metFORMIN (GLUCOPHAGE) 1000 MG tablet Take 1,000 mg by mouth 2 (two) times daily.     . metoprolol tartrate (LOPRESSOR) 25 MG tablet Take 25 mg by mouth 2 (two) times daily.     . nitroGLYCERIN (NITROSTAT) 0.4 MG SL tablet Place 1 tablet (0.4 mg total) under the tongue every 5 (five) minutes as needed for chest pain. MAX 3 dose 25 tablet 3  . NOVOLIN N RELION 100 UNIT/ML injection Inject 0.05 mLs (5 Units total) into the skin 2 (two) times daily. (Patient taking differently: Inject 10 Units into the skin 2 (two) times daily. ) 10 mL 11  . OXYGEN Inhale 2 L into the lungs at bedtime. 2lpm 24/7  DME- [employer]     . VICTOZA 18 MG/3ML SOPN Inject  1.2 mg into the skin every morning.      No current facility-administered medications for this visit.     REVIEW OF SYSTEMS:  [X]  denotes positive finding, [ ]  denotes negative finding Cardiac  Comments:  Chest pain or chest pressure:    Shortness of breath upon exertion: X   Short of breath when lying flat:    Irregular heart rhythm:        Vascular    Pain in calf, thigh, or hip brought on by ambulation:    Pain in feet at night that wakes you up from your sleep:     Blood clot in your veins:    Leg swelling:         Pulmonary    Oxygen at home: X   Productive cough:  X   Wheezing:         Neurologic    Sudden weakness in arms or legs:     Sudden numbness in arms or legs:     Sudden onset of difficulty speaking or slurred speech:    Temporary loss of vision in one eye:     Problems with dizziness:         Gastrointestinal    Blood in stool:     Vomited blood:         Genitourinary    Burning when urinating:     Blood in urine:        Psychiatric    Major depression:         Hematologic    Bleeding problems:    Problems with blood clotting too easily:        Skin    Rashes or ulcers:        Constitutional    Fever or chills:     PHYSICAL EXAM:   Vitals:   04/18/17 0933 04/18/17 0935  BP: (!) 147/77 (!) 152/76  Pulse: 85   Resp: 16   Temp: 97.1 F (36.2 C)   TempSrc: Oral   SpO2: 96%   Weight: 224 lb 11.2 oz (101.9 kg)   Height: 5\' 10"  (1.778 m)     GENERAL: The patient is a well-nourished male, in no acute distress. The vital signs are documented above. CARDIAC: There is a regular rate and rhythm VASCULAR: He has a right carotid bruit. He has palpable femoral and posterior tibial pulses bilaterally. PULMONARY: There is good air exchange bilaterally without wheezing or rales. ABDOMEN: Soft and non-tender with normal pitched bowel sounds.  MUSCULOSKELETAL: There are no major deformities or cyanosis. NEUROLOGIC: No focal weakness or paresthesias  are detected. SKIN: There are no ulcers or rashes noted. PSYCHIATRIC: The patient has a normal affect.  DATA:  DUPLEX ABDOMINAL AORTA: I have independently interpreted his duplex of the abdominal aorta today. His maximal diameter of the aneurysm is 5.2 cm. His one-month follow up CT scan showed the aneurysm to be 5.9 cm in maximum diameter. This is his first postoperative duplex.  MEDICAL ISSUES:   STATUS POST ENDOVASCULAR ANEURYSM REPAIR: His aneurysm has continued to decrease in size. This was a 6.1 cm aneurysm which is now 5.2 cm in maximum diameter. This reason I think we can stretch his follow up out to 1 year. I have ordered a duplex scan of his aneurysm in one year.   LEFT CAROTID STENOSIS: He has an 80% a symptomatically carotid stenosis. The patient and his wife feel that they would rather have the lung cancer and dressed before addressing the carotid stenosis. They will schedule an appointment with Dr. Quay Burow so that he can be evaluated for carotid stenting once his lung cancer treatment is complete.  Deitra Mayo [facility] [phone]

## 2017-04-19 ENCOUNTER — Ambulatory Visit
Admission: RE | Admit: 2017-04-19 | Discharge: 2017-04-19 | Disposition: A | Discharge status: Home / Self Care | Payer: Medicare HMO | Source: Ambulatory Visit | Attending: Radiation Oncology | Admitting: Radiation Oncology

## 2017-04-19 ENCOUNTER — Other Ambulatory Visit (HOSPITAL_COMMUNITY)
Admission: RE | Admit: 2017-04-19 | Discharge: 2017-04-19 | Disposition: A | Discharge status: Home / Self Care | Payer: Medicare HMO | Source: Ambulatory Visit | Attending: Internal Medicine | Admitting: Internal Medicine

## 2017-04-19 ENCOUNTER — Other Ambulatory Visit (HOSPITAL_BASED_OUTPATIENT_CLINIC_OR_DEPARTMENT_OTHER): Payer: Medicare HMO

## 2017-04-19 ENCOUNTER — Ambulatory Visit (HOSPITAL_BASED_OUTPATIENT_CLINIC_OR_DEPARTMENT_OTHER): Payer: Medicare HMO | Admitting: Internal Medicine

## 2017-04-19 ENCOUNTER — Ambulatory Visit: Payer: Medicare HMO | Attending: Internal Medicine | Admitting: Physical Therapy

## 2017-04-19 ENCOUNTER — Encounter: Payer: Self-pay | Admitting: Internal Medicine

## 2017-04-19 ENCOUNTER — Encounter: Payer: Self-pay | Admitting: *Deleted

## 2017-04-19 ENCOUNTER — Ambulatory Visit: Payer: Medicare HMO | Admitting: Radiation Oncology

## 2017-04-19 ENCOUNTER — Other Ambulatory Visit: Payer: Self-pay | Admitting: *Deleted

## 2017-04-19 ENCOUNTER — Telehealth: Payer: Self-pay | Admitting: Internal Medicine

## 2017-04-19 DIAGNOSIS — D649 Anemia, unspecified: Secondary | ICD-10-CM

## 2017-04-19 DIAGNOSIS — D509 Iron deficiency anemia, unspecified: Secondary | ICD-10-CM

## 2017-04-19 DIAGNOSIS — Z5111 Encounter for antineoplastic chemotherapy: Secondary | ICD-10-CM

## 2017-04-19 DIAGNOSIS — C3491 Malignant neoplasm of unspecified part of right bronchus or lung: Secondary | ICD-10-CM | POA: Insufficient documentation

## 2017-04-19 DIAGNOSIS — R2689 Other abnormalities of gait and mobility: Secondary | ICD-10-CM | POA: Insufficient documentation

## 2017-04-19 DIAGNOSIS — Z7189 Other specified counseling: Secondary | ICD-10-CM

## 2017-04-19 DIAGNOSIS — C3411 Malignant neoplasm of upper lobe, right bronchus or lung: Secondary | ICD-10-CM

## 2017-04-19 DIAGNOSIS — R918 Other nonspecific abnormal finding of lung field: Secondary | ICD-10-CM

## 2017-04-19 LAB — COMPREHENSIVE METABOLIC PANEL
ALT: 9 U/L (ref 0–55)
ANION GAP: 12 meq/L — AB (ref 3–11)
AST: 12 U/L (ref 5–34)
Albumin: 3.8 g/dL (ref 3.5–5.0)
Alkaline Phosphatase: 112 U/L (ref 40–150)
BUN: 19.5 mg/dL (ref 7.0–26.0)
CHLORIDE: 106 meq/L (ref 98–109)
CO2: 28 meq/L (ref 22–29)
Calcium: 9.7 mg/dL (ref 8.4–10.4)
Creatinine: 1.7 mg/dL — ABNORMAL HIGH (ref 0.7–1.3)
EGFR: 40 mL/min/{1.73_m2} — AB (ref >90)
GLUCOSE: 161 mg/dL — AB (ref 70–140)
Potassium: 4 mEq/L (ref 3.5–5.1)
SODIUM: 146 meq/L — AB (ref 136–145)
TOTAL PROTEIN: 7.2 g/dL (ref 6.4–8.3)
Total Bilirubin: <0.22 mg/dL (ref 0.20–1.20)

## 2017-04-19 LAB — CBC WITH DIFFERENTIAL/PLATELET
BASO%: 1 % (ref 0.0–2.0)
Basophils Absolute: 0.1 10*3/uL (ref 0.0–0.1)
EOS ABS: 1 10*3/uL — AB (ref 0.0–0.5)
EOS%: 8.2 % — ABNORMAL HIGH (ref 0.0–7.0)
HCT: 29.2 % — ABNORMAL LOW (ref 38.4–49.9)
HGB: 9 g/dL — ABNORMAL LOW (ref 13.0–17.1)
LYMPH%: 12.9 % — AB (ref 14.0–49.0)
MCH: 24.3 pg — AB (ref 27.2–33.4)
MCHC: 30.9 g/dL — AB (ref 32.0–36.0)
MCV: 78.8 fL — AB (ref 79.3–98.0)
MONO#: 0.8 10*3/uL (ref 0.1–0.9)
MONO%: 6.6 % (ref 0.0–14.0)
NEUT%: 71.3 % (ref 39.0–75.0)
NEUTROS ABS: 8.8 10*3/uL — AB (ref 1.5–6.5)
Platelets: 448 10*3/uL — ABNORMAL HIGH (ref 140–400)
RBC: 3.71 10*6/uL — ABNORMAL LOW (ref 4.20–5.82)
RDW: 18.6 % — ABNORMAL HIGH (ref 11.0–14.6)
WBC: 12.3 10*3/uL — AB (ref 4.0–10.3)
lymph#: 1.6 10*3/uL (ref 0.9–3.3)

## 2017-04-19 MED ORDER — PROCHLORPERAZINE MALEATE 10 MG PO TABS: 10.0000 mg | ORAL_TABLET | Freq: Four times a day (QID) | ORAL | 0 refills | Status: AC | PRN

## 2017-04-19 NOTE — Progress Notes (Signed)
[place] Telephone:([phone]) [phone]   [phone] Multidisciplinary thoracic oncology clinic  CONSULT NOTE  REFERRING PHYSICIAN: Dr. Tharon Aquas Trigt  REASON FOR CONSULTATION:  76 years old white male recently diagnosed with lung cancer.  HPI Rodney Orozco is a 76 y.o. male with past medical history significant for multiple medical problems including history of peripheral vascular disease, abdominal aortic aneurysm, carotid stenosis, cardiomyopathy, hypertension, dyslipidemia, obstructive sleep apnea, atrial fibrillation, diabetes mellitus, pulmonary embolism as well as vitamin D deficiency. The patient had CT angiogram of the neck for evaluation of carotid stenosis performed on 03/14/2017 and it showed unexpected findingnew right peritracheal lymphadenopathy since 2006 CT scan, suspicious for metastatic bronchogenic carcinoma in conjunction with a partially visible spiculated right upper lobe nodule. The patient had a PET scan on 03/28/2017 and it showed a spiculated nodule in the right upper lobe measuring 1.8 cm with associated metabolic activity with SUV max of 6.8. There was a rounded nodule in the right upper lobe measuring 1.1 cm with no significant metabolic activity. There was enlargement right lower paratracheal lymph node measuring 2.1 cm short axis and has intense metabolic activity with SUV max 15. Toes additional hypermetabolic subcarinal/right peribronchial lymph node with SUV max of 6.8 cm. There was also a large prevascular lymph node measuring 1.1 cm unchanged from the scan in 2016 and has mild metabolic activity with SUV max of 3.9. The patient was referred to Dr. Prescott Gum and on 04/11/2017 he underwent video bronchoscopy was brushing and washing of the right upper lobe in addition to ultrasound-guided transbronchial biopsy of a mediastinal lymph node. 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 Dr. Prescott Gum kindly referred the patient to the multidisciplinary thoracic oncology clinic today for evaluation and recommendation regarding treatment of his condition. When seen today the patient is feeling fine with no specific complaints except for shortness of breath with exertion and cough productive of clear sputum but no significant chest pain or hemoptysis. He denied having any recent weight loss or night sweats. He has no nausea, vomiting, diarrhea or constipation. He has no headache or visual changes. He is scheduled for MRI of the brain in few days. Family history significant for mother with stroke at age 37 and father had heart disease at age 97 and sister had breast cancer. The patient is married and has 3 children. He was accompanied today by his wife Rodney Orozco, daughter Rodney Orozco and granddaughter Rodney Orozco. The patient used to work as Pharmacologist. He has a history of smoking 1.5 pack per day for around 50 years and quit 7 years ago. He has no history of alcohol or drug abuse.  HPI  Past Medical History:  Diagnosis Date  . AAA (abdominal aortic aneurysm) ([place]) 2017  . Asthma   . Cardiomyopathy ([place])   . Dementia   . Dyslipidemia   . GI bleed   . HCAP (healthcare-associated pneumonia) 12/2013   01/06/2014  . Hypertension   . Mixed hyperlipidemia   . On home oxygen therapy    2L during the day  . OSA on CPAP    actually BIPAP  . Osteoarthritis   . Paroxysmal atrial fibrillation ([facility])    takes Warfarin  . Pneumonia December 2014   Had hemoptysis and admitted at [facility].  . Pulmonary embolism ([place]) 12/2014   S/P knee replacement/notes 08/31/2016  .  TIA (transient ischemic attack)   . Type II diabetes mellitus ([place])   . Vitamin D deficiency     Past Surgical History:  Procedure Laterality Date  . ABDOMINAL AORTIC ANEURYSM REPAIR  08/31/2016  . ABDOMINAL AORTIC ENDOVASCULAR STENT  GRAFT N/A 08/31/2016   Procedure: ABDOMINAL AORTIC ENDOVASCULAR STENT GRAFT and Open Exposure left common femoral artery;  Surgeon: Angelia Mould, MD;  Location: [facility] OR;  Service: Vascular;  Laterality: N/A;  . ABDOMINAL SURGERY  1999   "for aneurysm"  . ABDOMINAL SURGERY     bleeding PUD in setting of aspirin powders. REMOTE. also H.pylori positive per epic notes  . BACK SURGERY    . CARDIAC CATHETERIZATION N/A 12/05/2016   Procedure: Right/Left Heart Cath and Coronary Angiography;  Surgeon: Jettie Booze, MD;  Location: [place] [facility] LAB;  Service: Cardiovascular;  Laterality: N/A;  . CARDIAC CATHETERIZATION N/A 12/05/2016   Procedure: Coronary Stent Intervention;  Surgeon: Jettie Booze, MD;  Location: [place] [facility];  Service: Cardiovascular;  Laterality: N/A;  . COLONOSCOPY N/A 01/28/2017   Procedure: COLONOSCOPY;  Surgeon: Rogene Houston, MD;  [facility];  Service: Endoscopy;  Laterality: N/A;  . COLONOSCOPY WITH ESOPHAGOGASTRODUODENOSCOPY (EGD)  2004   Dr. Amedeo Plenty: antral gastritis, hypersplastic rectal polyp, few scattered diverticula  . ESOPHAGOGASTRODUODENOSCOPY N/A 01/27/2017   Procedure: ESOPHAGOGASTRODUODENOSCOPY (EGD);  Surgeon: Rogene Houston, MD;  Location: [facility];  Service: Endoscopy;  Laterality: N/A;  . JOINT REPLACEMENT    . [place]  . TOTAL KNEE ARTHROPLASTY Bilateral Feb 2016 - Dec 2016  . VIDEO BRONCHOSCOPY WITH ENDOBRONCHIAL ULTRASOUND N/A 04/11/2017   Procedure: VIDEO BRONCHOSCOPY WITH ENDOBRONCHIAL ULTRASOUND;  Surgeon: Ivin Poot, MD;  Location[facility] OR;  Service: Thoracic;  Laterality: N/A;    Family History  Problem Relation Age of Onset  . Dementia Mother   . Stroke Mother   . Heart attack Father     Social History Social History  Substance Use Topics  . Smoking status: Former Smoker    Packs/day: 2.00    Years: 50.00    Types: Cigarettes    Quit date: 11/07/2013  . Smokeless tobacco:  Never Used  . Alcohol use No    Allergies  Allergen Reactions  . Codeine Other (See Comments)    Hallucinations  . Tramadol Dermatitis  . Warfarin And Related     Rectal bleeding   . Lipitor [Atorvastatin] Other (See Comments)    Joint pain  . Penicillins Rash    HIves/rash Has patient had a PCN reaction causing immediate rash, facial/tongue/throat swelling, SOB or lightheadedness with hypotension: Yes Has patient had a PCN reaction causing severe rash involving mucus membranes or skin necrosis: No Has patient had a PCN reaction that required hospitalization No Has patient had a PCN reaction occurring within the last 10 years: No If all of the above answers are "NO", then may proceed with Cephalosporin use.     Current Outpatient Prescriptions  Medication Sig Dispense Refill  . acetaminophen (TYLENOL) 500 MG tablet Take 1,000 mg by mouth 2 (two) times daily as needed for headache.    Marland Kitchen amLODipine (NORVASC) 5 MG tablet Take 1 tablet (5 mg total) by mouth daily. 30 tablet 12  . aspirin EC 81 MG tablet Take 1 tablet (81 mg total) by mouth daily. 90 tablet 3  . clopidogrel (PLAVIX) 75 MG tablet Take 1 tablet (75 mg total) by mouth daily. 30 tablet 12  .  donepezil (ARICEPT) 10 MG tablet Take 1 tablet (10 mg total) by mouth at bedtime. 30 tablet 12  . famotidine (PEPCID) 20 MG tablet Take 20 mg by mouth 2 (two) times daily.     . flintstones complete (FLINTSTONES) 60 MG chewable tablet Chew 1 tablet by mouth 2 (two) times daily.    . furosemide (LASIX) 40 MG tablet Take 1 tablet (40 mg total) by mouth daily. 30 tablet   . losartan (COZAAR) 100 MG tablet Take 100 mg by mouth daily.    . metFORMIN (GLUCOPHAGE) 1000 MG tablet Take 1,000 mg by mouth 2 (two) times daily.     . metoprolol tartrate (LOPRESSOR) 25 MG tablet Take 25 mg by mouth 2 (two) times daily.     . nitroGLYCERIN (NITROSTAT) 0.4 MG SL tablet Place 1 tablet (0.4 mg total) under the tongue every 5 (five) minutes as needed for  chest pain. MAX 3 dose 25 tablet 3  . NOVOLIN N RELION 100 UNIT/ML injection Inject 0.05 mLs (5 Units total) into the skin 2 (two) times daily. (Patient taking differently: Inject 10 Units into the skin 2 (two) times daily. ) 10 mL 11  . OXYGEN Inhale 2 L into the lungs at bedtime. 2lpm 24/7  DME- [employer]     . VICTOZA 18 MG/3ML SOPN Inject 1.2 mg into the skin every morning.      No current facility-administered medications for this visit.     Review of Systems  Constitutional: negative Eyes: negative Ears, nose, mouth, throat, and face: negative Respiratory: positive for cough, dyspnea on exertion and sputum Cardiovascular: negative Gastrointestinal: negative Genitourinary:negative Integument/breast: negative Hematologic/lymphatic: negative Musculoskeletal:negative Neurological: negative Behavioral/Psych: negative Endocrine: negative Allergic/Immunologic: negative  Physical Exam  JQZ:ESPQZ, healthy, no distress, well nourished and well developed SKIN: skin color, texture, turgor are normal, no rashes or significant lesions HEAD: Normocephalic, No masses, lesions, tenderness or abnormalities EYES: normal, PERRLA, Conjunctiva are pink and non-injected EARS: External ears normal, Canals clear OROPHARYNX:no exudate, no erythema and lips, buccal mucosa, and tongue normal  NECK: supple, no adenopathy, no JVD LYMPH:  no palpable lymphadenopathy, no hepatosplenomegaly LUNGS: clear to auscultation , and palpation HEART: regular rate & rhythm, no murmurs and no gallops ABDOMEN:abdomen soft, non-tender and normal bowel sounds BACK: Back symmetric, no curvature., No CVA tenderness EXTREMITIES:no joint deformities, effusion, or inflammation, no edema, no skin discoloration  NEURO: alert & oriented x 3 with fluent speech, no focal motor/sensory deficits  PERFORMANCE STATUS: ECOG 1  LABORATORY DATA: Lab Results  Component Value Date   WBC 12.3 (H) 04/19/2017   HGB 9.0 (L) 04/19/2017    HCT 29.2 (L) 04/19/2017   MCV 78.8 (L) 04/19/2017   PLT 448 (H) 04/19/2017      Chemistry      Component Value Date/Time   NA 146 (H) 04/19/2017 1242   K 4.0 04/19/2017 1242   CL 104 04/06/2017 1129   CO2 28 04/19/2017 1242   BUN 19.5 04/19/2017 1242   CREATININE 1.7 (H) 04/19/2017 1242      Component Value Date/Time   CALCIUM 9.7 04/19/2017 1242   ALKPHOS 112 04/19/2017 1242   AST 12 04/19/2017 1242   ALT 9 04/19/2017 1242   BILITOT <0.22 04/19/2017 1242       RADIOGRAPHIC STUDIES: Dg Chest 2 View  Result Date: 04/06/2017 CLINICAL DATA:  Pre admit for lung biopsy. EXAM: CHEST  2 VIEW COMPARISON:  12/03/2016 FINDINGS: There is mild bilateral interstitial prominence. There is focal right lower  lobe nodular scarring. There is calcified pleural plaque. There is no pleural effusion or pneumothorax. The heart and mediastinal contours are unremarkable. The osseous structures are unremarkable. IMPRESSION: 1. No acute cardiopulmonary disease. 2. Chronic calcified right pleural plaque and focal scarring in the right lobe base. Electronically Signed   By: Kathreen Devoid   On: 04/06/2017 14:40   Nm Pet Image Initial (pi) Skull Base To Thigh  Result Date: 03/28/2017 CLINICAL DATA:  Initial treatment strategy for RIGHT lung mass. EXAM: NUCLEAR MEDICINE PET SKULL BASE TO THIGH TECHNIQUE: 11.0 mCi F-18 FDG was injected intravenously. Full-ring PET imaging was performed from the skull base to thigh after the radiotracer. CT data was obtained and used for attenuation correction and anatomic localization. FASTING BLOOD GLUCOSE:  Value: 154 mg/dl COMPARISON:  Neck CT 03/14/2017 FINDINGS: NECK No hypermetabolic lymph nodes in the neck. CHEST Peripheral spiculated nodule in the RIGHT upper lobe measures 1.8 cm (image 64, series 4) and has associated metabolic activity with SUV max equal 6.8. Rounded nodule in the RIGHT upper lobe measuring 11 mm (image 68, series 4) does not have significant metabolic  activity. Enlarged RIGHT lower paratracheal lymph node measuring 2.1 cm short axis and has intense metabolic activity SUV max equal 15. Additional hypermetabolic subcarinal / RIGHT peribronchial lymph node with SUV max equal 6.8. No hypermetabolic supraclavicular nodes. Enlarged prevascular lymph node measuring 11 mm (image 63, series 4) however this is not changed from 12 mm on CT of 05/20/2015. This node has mild metabolic activity (SUV max equal 3.9). ABDOMEN/PELVIS No abnormal metabolic activity liver. Adrenal glands are normal. Pancreas and kidneys normal. No hypermetabolic abdominopelvic lymph nodes. Abdominal aortic stent graft noted. SKELETON No focal hypermetabolic activity to suggest skeletal metastasis. IMPRESSION: 1. Hypermetabolic spiculated nodule in the RIGHT upper lobe consistent with primary bronchogenic carcinoma. 2. Hypermetabolic ipsilateral metastatic mediastinal adenopathy to the paratracheal and subcarinal nodal station. 3. Low metabolic activity of prevascular lymph node is similar in size from comparison exam and therefore favored benign. These results will be called to the ordering clinician or representative by the Radiologist Assistant, and communication documented in the PACS or zVision Dashboard. Electronically Signed   By: Suzy Bouchard M.D.   On: 03/28/2017 16:47   Dg Chest Port 1 View  Result Date: 04/11/2017 CLINICAL DATA:  Status post lung biopsy. EXAM: PORTABLE CHEST 1 VIEW COMPARISON:  04/06/2017 FINDINGS: The cardiac silhouette is borderline enlarged. Lung volumes are diminished compared to the prior study with diffusely increased interstitial markings and slight elevation of the right hemidiaphragm. Calcified pleural plaques are again noted in the right lung. No large pleural effusion or pneumothorax is identified. IMPRESSION: 1. Low lung volumes with diffusely increased interstitial markings which may reflect mild edema. 2. No pneumothorax. Electronically Signed   By:  Logan Bores M.D.   On: 04/11/2017 10:14    ASSESSMENT: This is a very pleasant 76 years old white male recently diagnosed with a stage IIIA (T1a, N2, M0) non-small cell lung cancer questionable for adenocarcinoma presented with right upper lobe lung nodule in addition to mediastinal lymphadenopathy diagnosed in May 2018.  PLAN: I had a lengthy discussion with the patient and his family today about his current disease stage, prognosis and treatment options. I personally and independently reviewed the imaging studies and discuss the results and showed the images of the PET scan to the patient and his family. I recommended for him to complete the staging workup and he is scheduled for MRI of the  brain on 04/22/2017. If there is sufficient tissue from the biopsy, I will send it to [facility] one for molecular studies and PDL 1 expression. I discussed with the patient his treatment options. I recommended for the patient a course of concurrent chemoradiation with weekly carboplatin for AUC of 2 and paclitaxel 45 MG/M2. I discussed with the patient adverse effect of the chemotherapy including but not limited to alopecia, myelosuppression, nausea and vomiting, peripheral neuropathy, liver or renal dysfunction. I will arrange for the patient to have a chemotherapy education class before starting the first dose of his chemotherapy. He may also benefit from consolidation immunotherapy after completion of the course of concurrent chemoradiation if he has no evidence of disease progression. For the anemia, I will start the patient on oral iron tablets. I will call his pharmacy with prescription for Compazine 10 mg by mouth every 6 hours as needed for nausea. The patient was seen by Dr. Sondra Come today for evaluation and discussion of the radiotherapy option but he lives in [facility] and he may benefit from receiving his radiotherapy there. He is still interested in coming to [facility] for his chemotherapy  and future treatment. The patient is expected to start the first dose of his chemotherapy on 04/30/2017. I'll see him back for follow-up visit in 3 weeks for evaluation and management of any adverse effect of his treatment. The patient was seen during the multidisciplinary thoracic oncology clinic today by medical oncology, radiation oncology, thoracic navigator, social worker and physical therapist. He was advised to call immediately if he has any concerning symptoms in the interval. The patient voices understanding of current disease status and treatment options and is in agreement with the current care plan. All questions were answered. The patient knows to call the clinic with any problems, questions or concerns. We can certainly see the patient much sooner if necessary. Thank you so much for allowing me to participate in the care of PIOTR CHRISTOPHER. I will continue to follow up the patient with you and assist in his care. I spent 55 minutes counseling the patient face to face. The total time spent in the appointment was 80 minutes. Disclaimer: This note was dictated with voice recognition software. Similar sounding words can inadvertently be transcribed and may not be corrected upon review.   Sahmir Weatherbee K. April 19, 2017, 1:32 PM

## 2017-04-19 NOTE — Therapy (Signed)
[place], [place], [address] Phone: [phone]   Fax:  [phone]  Physical Therapy Evaluation  Patient Details  Name: Rodney Orozco MRN: [id number] Date of Birth: 12-28-1940 Referring Provider: Dr. Curt Bears  Encounter Date: 04/19/2017      PT End of Session - 04/19/17 1450    Visit Number 1   Number of Visits 1   PT Start Time 1402   PT Stop Time 1431   PT Time Calculation (min) 29 min   Activity Tolerance Patient tolerated treatment well   Behavior During Therapy [facility] for tasks assessed/performed      Past Medical History:  Diagnosis Date  . AAA (abdominal aortic aneurysm) ([place]) 2017  . Asthma   . Cardiomyopathy ([place])   . Dementia   . Dyslipidemia   . GI bleed   . HCAP (healthcare-associated pneumonia) 12/2013   01/06/2014  . Hypertension   . Mixed hyperlipidemia   . On home oxygen therapy    2L during the day  . OSA on CPAP    actually BIPAP  . Osteoarthritis   . Paroxysmal atrial fibrillation (HCC)    takes Warfarin  . Pneumonia December 2014   Had hemoptysis and admitted at [facility]  . Pulmonary embolism ([place]) 12/2014   S/P knee replacement/notes 08/31/2016  . TIA (transient ischemic attack)   . Type II diabetes mellitus ([place])   . Vitamin D deficiency     Past Surgical History:  Procedure Laterality Date  . ABDOMINAL AORTIC ANEURYSM REPAIR  08/31/2016  . ABDOMINAL AORTIC ENDOVASCULAR STENT GRAFT N/A 08/31/2016   Procedure: ABDOMINAL AORTIC ENDOVASCULAR STENT GRAFT and Open Exposure left common femoral artery;  Surgeon: Angelia Mould, MD;  Location: [facility];  Service: Vascular;  Laterality: N/A;  . ABDOMINAL SURGERY  1999   "for aneurysm"  . ABDOMINAL SURGERY     bleeding PUD in setting of aspirin powders. REMOTE. also H.pylori positive per epic notes  . BACK SURGERY    . CARDIAC CATHETERIZATION N/A 12/05/2016   Procedure: Right/Left Heart Cath and  Coronary Angiography;  Surgeon: Jettie Booze, MD;  Location: [place] CV LAB;  Service: Cardiovascular;  Laterality: N/A;  . CARDIAC CATHETERIZATION N/A 12/05/2016   Procedure: Coronary Stent Intervention;  Surgeon: Jettie Booze, MD;  Location: [place] CV LAB;  Service: Cardiovascular;  Laterality: N/A;  . COLONOSCOPY N/A 01/28/2017   Procedure: COLONOSCOPY;  Surgeon: Rogene Houston, MD;  [facility];  Service: Endoscopy;  Laterality: N/A;  . COLONOSCOPY WITH ESOPHAGOGASTRODUODENOSCOPY (EGD)  2004   Dr. Amedeo Plenty: antral gastritis, hypersplastic rectal polyp, few scattered diverticula  . ESOPHAGOGASTRODUODENOSCOPY N/A 01/27/2017   Procedure: ESOPHAGOGASTRODUODENOSCOPY (EGD);  Surgeon: Rogene Houston, MD;  Location[facility];  Service: Endoscopy;  Laterality: N/A;  . JOINT REPLACEMENT    . [place]  . TOTAL KNEE ARTHROPLASTY Bilateral Feb 2016 - Dec 2016  . VIDEO BRONCHOSCOPY WITH ENDOBRONCHIAL ULTRASOUND N/A 04/11/2017   Procedure: VIDEO BRONCHOSCOPY WITH ENDOBRONCHIAL ULTRASOUND;  Surgeon: Ivin Poot, MD;  Location: [place];  Service: Thoracic;  Laterality: N/A;    There were no vitals filed for this visit.       Subjective Assessment - 04/19/17 1437    Subjective "I like [facility]."  --patient may want to do therapy at [facility] if needed.   Patient is accompained by: Family member  wife, daughter, granddaughter   Pertinent History  Incidental finding of right lung nodule and peritracheal lymphadenopathy with CTA neck when being worked up for carotid stenosis.  He is expected to have chemoradiation.  Ex-smoker quit 2014 (100 pack-years); AAA s/p repair 2017; CAD; cardiomyopathy, HT, OSA on CPAP; h/o bilateral TKAs, DM.   Patient Stated Goals get info from all lung clinic providers   Currently in Pain? No/denies            [facility] PT Assessment - 04/19/17 0001      Assessment   Medical Diagnosis Non-small cell lung cancer  (right upper lobe nodule) and lymphadenopathy   Referring Provider Dr. Curt Bears   Onset Date/Surgical Date 03/14/17   Hand Dominance Right   Prior Therapy none     Precautions   Precautions Other (comment)   Precaution Comments cancer precautions     Restrictions   Weight Bearing Restrictions No     Balance Screen   Has the patient fallen in the past 6 months No   Has the patient had a decrease in activity level because of a fear of falling?  No   Is the patient reluctant to leave their home because of a fear of falling?  No     Home Environment   Living Environment Private residence   Living Arrangements Spouse/significant other   Type of [place] One level;Laundry or work area in basement     Prior Function   Level of Independence Independent   Leisure no regular exercise     Cognition   Overall Cognitive Status Within Functional Limits for tasks assessed   Behaviors Other (comment)  patient is witty and likes to joke around     Observation/Other Assessments   Observations well looking older man in no distress accompanied by his wife, daughter, and granddaughter, all with a sense of humor     Functional Tests   Functional tests Sit to Stand     Sit to Stand   Comments 8 times in 30 seconds, below average for age  mild dyspnea following     Posture/Postural Control   Posture/Postural Control Postural limitations   Posture Comments slight tendency toward forward flexion in standing;     ROM / Strength   AROM / PROM / Strength AROM     AROM   Overall AROM Comments in standing, trunk AROM WFL all motions with mild limitation in sidebend bilat.     Ambulation/Gait   Ambulation/Gait Yes   Ambulation/Gait Assistance 6: Modified independent (Device/Increase time)  needs railing on stairs   Stairs Yes   Stairs Assistance 6: Modified independent (Device/Increase time)  need railing, by his report (not directly observed)     Balance    Balance Assessed Yes     Dynamic Standing Balance   Dynamic Standing - Comments reaches forward approx. 10 inches in standing, below average for age            Objective measurements completed on examination: See above findings.                  PT Education - 04/19/17 1449    Education provided Yes   Education Details energy conservation, walking, CURE article on staying active, posture, breathing, PT info   Person(s) Educated Patient;Spouse;Child(ren);Other (comment)  grandchild   Methods Explanation;Handout   Comprehension Verbalized understanding               Lung Clinic Goals - 04/19/17 1457  Patient will be able to verbalize understanding of the benefit of exercise to decrease fatigue.   Status Achieved     Patient will be able to verbalize the importance of posture.   Status Achieved     Patient will be able to demonstrate diaphragmatic breathing for improved lung function.   Status Achieved     Patient will be able to verbalize understanding of the role of physical therapy to prevent functional decline and who to contact if physical therapy is needed.   Status Achieved              Plan - 04/19/17 1451    Clinical Impression Statement This is a very pleasant gentleman with new diagnosis of non small cell lung cancer with lymphadenopathy.  He has mildly limited trunk AROM, needs railing to negotiate stairs, decreased performance on 30 second sit to stand, and decreased forward reach in standing.   History and Personal Factors relevant to plan of care: DM, h/o bilat. TKAs, h/o TIA   Clinical Presentation Evolving   Clinical Presentation due to: new diagnosis of lung cancer and beginning treatment with chemoradiation   Clinical Decision Making Moderate   Rehab Potential Good   PT Frequency One time visit   PT Treatment/Interventions Patient/family education   PT Next Visit Plan None at this time; if patient needs therapy going  forward he would like to go to [facility].   PT Home Exercise Plan walking program, breathing exercise   Consulted and Agree with Plan of Care Patient      Patient will benefit from skilled therapeutic intervention in order to improve the following deficits and impairments:  Decreased activity tolerance, Decreased balance, Decreased mobility  Visit Diagnosis: Other abnormalities of gait and mobility - Plan: PT plan of care cert/re-cert  Malignant neoplasm of upper lobe of right lung [facility]) - Plan: PT plan of care cert/re-cert      G-Codes - 25/95/63 1458    Functional Assessment Tool Used (Outpatient Only) clinical judgement   Functional Limitation Mobility: Walking and moving around   Mobility: Walking and Moving Around Current Status ([id number]) At least 1 percent but less than 20 percent impaired, limited or restricted   Mobility: Walking and Moving Around Goal Status [phone]) At least 1 percent but less than 20 percent impaired, limited or restricted   Mobility: Walking and Moving Around Discharge Status [phone]) At least 1 percent but less than 20 percent impaired, limited or restricted       Problem List Patient Active Problem List   Diagnosis Date Noted  . Adenocarcinoma of right lung, stage 3 ([place]) 04/19/2017  . Lung mass 04/12/2017  . Carotid artery disease ([place]) 02/28/2017  . CAD S/P percutaneous coronary angioplasty 02/08/2017  . Transfusion-dependent anemia   . GI bleed 01/24/2017  . Cardiomyopathy ([place])   . Elevated troponin I level   . Acute coronary syndrome ([place])   . Chest pain 12/03/2016  . Palpitations 12/03/2016  . Chronic combined systolic and diastolic CHF (congestive heart failure) ([place]) 12/03/2016  . AKI (acute kidney injury) ([place]) vs CKD 12/03/2016  . AAA (abdominal aortic aneurysm)  Hospital) s/p repair in oct 2017 by dr Scot Dock 08/31/2016  . Long-term (current) use of anticoagulants 08/25/2016  . Central sleep apnea 08/08/2016  . COPD GOLD III 07/21/2016  .  Paroxysmal atrial fibrillation ([place]) 07/12/2016  . Left ventricular dysfunction 07/12/2016  . Acute combined systolic and diastolic heart failure ([place]) 07/12/2016  . Cognitive changes 04/25/2016  .  Gastroesophageal reflux disease without esophagitis 07/29/2015  . Morbid obesity due to excess calories ([place]) 05/13/2015  . Primary osteoarthritis of right knee 02/09/2015  . Pulmonary embolism without acute cor pulmonale ([place]) 2016 post op treated for a year 02/09/2015  . Postoperative pulmonary embolism ([place]) 01/21/2015  . DM type 2 with diabetic dyslipidemia ([place]) 08/18/2014  . Other and unspecified hyperlipidemia 04/27/2014  . Influenza B 01/07/2014  . Streptococcus pneumoniae pneumonia ([place]) 01/06/2014  . Acute respiratory failure with hypoxia ([place]) 01/06/2014  . COPD with acute exacerbation ([place]) 01/06/2014  . Healthcare-associated pneumonia 01/06/2014  . Diabetes mellitus without complication ([place])   . Hypertension   . Dyslipidemia     Dani Wallner 04/19/2017, 3:01 PM  [place], [place], [address] Phone: [phone]   Fax:  [phone]  Name: CARRELL RAHMANI MRN: [id number] Date of Birth: 03-14-1941  Serafina Royals, PT 04/19/17 3:01 PM

## 2017-04-19 NOTE — Progress Notes (Signed)
START ON PATHWAY REGIMEN - Non-Small Cell Lung     Administer weekly:     Paclitaxel      Carboplatin   **Always confirm dose/schedule in your pharmacy ordering system**  Patient Characteristics: Stage III - Unresectable, PS = 0, 1 AJCC T Category: T1b Current Disease Status: No Distant Mets or Local Recurrence AJCC N Category: N2 AJCC M Category: M0 AJCC 8 Stage Grouping: IIIA Performance Status: PS = 0, 1 Intent of Therapy: Curative Intent, Discussed with Patient 

## 2017-04-19 NOTE — Progress Notes (Signed)
Requested pathology to send foundation one and PDL 1 per Dr. Julien Nordmann.

## 2017-04-19 NOTE — Telephone Encounter (Signed)
Chemo education class scheduled for 04/24/17, per 04/19/17 los. Weekly labs with Chemo starting 04/30/17, per 04/19/17 los.. Chemotherapy (Carboplatin & Taxol) weekly with start date of  04/30/17 x 7 weeks, per 04/19/17 los. Follow up visit scheduled for 05/07/17, per 04/19/17 los. Patient was given a copy of the AVS report and appointment schedule, per 04/19/17 los.

## 2017-04-19 NOTE — Progress Notes (Signed)
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 male who underwent a CT angio of the neck on 03/16/17 for pretreatment planning due to left carotid stenosis. This incidentally revealed new right peritracheal lymphadenopathy suspicious for metastatic bronchogenic carcinoma in conjunction with a partially visible spiculated right lung nodule. PET scan on 03/28/17 confirmed a hypermetabolic spiculated nodule in the right upper lobe consistent with primary bronchogenic carcinoma. There was hypermetabolic ipsilateral metastatic mediastinal adenopathy in the paratracheal and subcarinal nodal station. There was also low metabolic activity of the prevascular lymph node favored to be benign. The patient presented to Dr. Prescott Gum on 04/11/17 and underwent video bronchoscopy and ultrasound-guided transbronchial biopsy. Pathology showed stage III non-small cell lung cancer. Patient is scheduled for an MRI of the brain on 04/22/17.  Patient reports positive for coughing, he reports an episode of hemoptysis following the bronchoscopy (this has resolved). He denies shortness of breath, pain in the chest, dizziness or headaches.   PREVIOUS RADIATION THERAPY: No  PAST MEDICAL HISTORY:  has a past medical history of AAA (abdominal aortic aneurysm) ([place]) (2017); Asthma; Cardiomyopathy ([place]); Dementia; Dyslipidemia; GI bleed; HCAP (healthcare-associated pneumonia) (12/2013); Hypertension; Mixed hyperlipidemia; On home oxygen therapy; OSA on CPAP; Osteoarthritis; Paroxysmal atrial fibrillation ([place]); Pneumonia  (December 2014); Pulmonary embolism ([place]) (12/2014); TIA (transient ischemic attack); Type II diabetes mellitus ([place]); and Vitamin D deficiency.    PAST SURGICAL HISTORY: Past Surgical History:  Procedure Laterality Date  . ABDOMINAL AORTIC ANEURYSM REPAIR  08/31/2016  . ABDOMINAL AORTIC ENDOVASCULAR STENT GRAFT N/A 08/31/2016   Procedure: ABDOMINAL AORTIC ENDOVASCULAR STENT GRAFT and Open Exposure left common femoral artery;  Surgeon: Angelia Mould, MD;  Location: [facility] OR;  Service: Vascular;  Laterality: N/A;  . ABDOMINAL SURGERY  1999   "for aneurysm"  . ABDOMINAL SURGERY     bleeding PUD in setting of aspirin powders. REMOTE. also H.pylori positive per epic notes  . BACK SURGERY    . CARDIAC CATHETERIZATION N/A 12/05/2016   Procedure: Right/Left Heart Cath and Coronary Angiography;  Surgeon: Jettie Booze, MD;  Location: [place];  Service: Cardiovascular;  Laterality: N/A;  . CARDIAC CATHETERIZATION N/A 12/05/2016   Procedure: Coronary Stent Intervention;  Surgeon: Jettie Booze, MD;  Location: [place] [facility];  Service: Cardiovascular;  Laterality: N/A;  . COLONOSCOPY N/A 01/28/2017   Procedure: COLONOSCOPY;  Surgeon: Rogene Houston, MD;  [facility];  Service: Endoscopy;  Laterality: N/A;  . COLONOSCOPY WITH ESOPHAGOGASTRODUODENOSCOPY (EGD)  2004   Dr. Amedeo Plenty: antral gastritis, hypersplastic rectal polyp, few scattered diverticula  . ESOPHAGOGASTRODUODENOSCOPY N/A 01/27/2017   Procedure: ESOPHAGOGASTRODUODENOSCOPY (EGD);  Surgeon: Rogene Houston, MD;  [facility];  Service: Endoscopy;  Laterality: N/A;  . JOINT REPLACEMENT    . [place]  . TOTAL KNEE ARTHROPLASTY Bilateral Feb 2016 - Dec 2016  . VIDEO BRONCHOSCOPY WITH ENDOBRONCHIAL ULTRASOUND N/A 04/11/2017   Procedure: VIDEO BRONCHOSCOPY WITH ENDOBRONCHIAL ULTRASOUND;  Surgeon: Ivin Poot, MD;  Location: [place];  Service: Thoracic;  Laterality: N/A;  FAMILY HISTORY: family history includes Dementia in his mother; Heart attack in his father; Stroke in his mother.  SOCIAL HISTORY:  reports that he quit smoking about 3 years ago. His smoking use included Cigarettes. He has a 100.00 pack-year smoking history. He has never used smokeless tobacco. He reports that he does not drink alcohol or use drugs.  ALLERGIES: Codeine; Tramadol; Warfarin and related; Lipitor [atorvastatin]; and Penicillins  MEDICATIONS:  Current Outpatient Prescriptions  Medication Sig Dispense Refill  . acetaminophen (TYLENOL) 500 MG tablet Take 1,000 mg by mouth 2 (two) times daily as needed for headache.    Marland Kitchen amLODipine (NORVASC) 5 MG tablet Take 1 tablet (5 mg total) by mouth daily. 30 tablet 12  . aspirin EC 81 MG tablet Take 1 tablet (81 mg total) by mouth daily. 90 tablet 3  . clopidogrel (PLAVIX) 75 MG tablet Take 1 tablet (75 mg total) by mouth daily. 30 tablet 12  . donepezil (ARICEPT) 10 MG tablet Take 1 tablet (10 mg total) by mouth at bedtime. 30 tablet 12  . famotidine (PEPCID) 20 MG tablet Take 20 mg by mouth 2 (two) times daily.     . flintstones complete (FLINTSTONES) 60 MG chewable tablet Chew 1 tablet by mouth 2 (two) times daily.    . furosemide (LASIX) 40 MG tablet Take 1 tablet (40 mg total) by mouth daily. 30 tablet   . losartan (COZAAR) 100 MG tablet Take 100 mg by mouth daily.    . metFORMIN (GLUCOPHAGE) 1000 MG tablet Take 1,000 mg by mouth 2 (two) times daily.     . metoprolol tartrate (LOPRESSOR) 25 MG tablet Take 25 mg by mouth 2 (two) times daily.     . nitroGLYCERIN (NITROSTAT) 0.4 MG SL tablet Place 1 tablet (0.4 mg total) under the tongue every 5 (five) minutes as needed for chest pain. MAX 3 dose (Patient not taking: Reported on 04/19/2017) 25 tablet 3  . NOVOLIN N RELION 100 UNIT/ML injection Inject 0.05 mLs (5 Units total) into the skin 2 (two) times daily. (Patient taking differently: Inject 10 Units into the skin 2 (two) times daily. )  10 mL 11  . OXYGEN Inhale 2 L into the lungs at bedtime. 2lpm 24/7  DME- [employer]     . prochlorperazine (COMPAZINE) 10 MG tablet Take 1 tablet (10 mg total) by mouth every 6 (six) hours as needed for nausea or vomiting. 30 tablet 0  . VICTOZA 18 MG/3ML SOPN Inject 1.2 mg into the skin every morning.      No current facility-administered medications for this encounter.     REVIEW OF SYSTEMS: REVIEW OF SYSTEMS: A 10+ POINT REVIEW OF SYSTEMS WAS OBTAINED including neurology, dermatology, psychiatry, cardiac, respiratory, lymph, extremities, GI, GU, musculoskeletal, constitutional, reproductive, HEENT. All pertinent positives are noted in the HPI. All others are negative.     PHYSICAL EXAM:  Vitals with BMI 04/19/2017  Height 5\' 10"   Weight 227 lbs 5 oz  BMI 41.3  Systolic 244  Diastolic 74  Pulse 64  Respirations 19   General: Alert and oriented, in no acute distress HEENT: Head is normocephalic. Extraocular movements are intact. Oropharynx is clear. Neck: Neck is supple, no palpable cervical or supraclavicular lymphadenopathy. Carotid bruit on the left side.  Heart: Regular in rate and rhythm with no murmurs, rubs, or gallops.  Chest: Clear to auscultation bilaterally, with no rhonchi, wheezes, or rales. Abdomen: Soft, nontender, nondistended, with no rigidity or guarding. Extremities: No cyanosis or edema. Lymphatics:  see Neck Exam Skin: No concerning lesions. Musculoskeletal: symmetric strength and muscle tone throughout. Neurologic: Cranial nerves II through XII are grossly intact. No obvious focalities. Speech is fluent. Coordination is intact. Psychiatric: Judgment and insight are intact. Affect is appropriate.  ECOG = 1   LABORATORY DATA:  Lab Results  Component Value Date   WBC 12.3 (H) 04/19/2017   HGB 9.0 (L) 04/19/2017   HCT 29.2 (L) 04/19/2017   MCV 78.8 (L) 04/19/2017   PLT 448 (H) 04/19/2017   NEUTROABS 8.8 (H) 04/19/2017   Lab Results  Component Value Date    NA 146 (H) 04/19/2017   K 4.0 04/19/2017   CL 104 04/06/2017   CO2 28 04/19/2017   GLUCOSE 161 (H) 04/19/2017   CREATININE 1.7 (H) 04/19/2017   CALCIUM 9.7 04/19/2017      RADIOGRAPHY: Dg Chest 2 View  Result Date: 04/06/2017 CLINICAL DATA:  Pre admit for lung biopsy. EXAM: CHEST  2 VIEW COMPARISON:  12/03/2016 FINDINGS: There is mild bilateral interstitial prominence. There is focal right lower lobe nodular scarring. There is calcified pleural plaque. There is no pleural effusion or pneumothorax. The heart and mediastinal contours are unremarkable. The osseous structures are unremarkable. IMPRESSION: 1. No acute cardiopulmonary disease. 2. Chronic calcified right pleural plaque and focal scarring in the right lobe base. Electronically Signed   By: Kathreen Devoid   On: 04/06/2017 14:40   Nm Pet Image Initial (pi) Skull Base To Thigh  Result Date: 03/28/2017 CLINICAL DATA:  Initial treatment strategy for RIGHT lung mass. EXAM: NUCLEAR MEDICINE PET SKULL BASE TO THIGH TECHNIQUE: 11.0 mCi F-18 FDG was injected intravenously. Full-ring PET imaging was performed from the skull base to thigh after the radiotracer. CT data was obtained and used for attenuation correction and anatomic localization. FASTING BLOOD GLUCOSE:  Value: 154 mg/dl COMPARISON:  Neck CT 03/14/2017 FINDINGS: NECK No hypermetabolic lymph nodes in the neck. CHEST Peripheral spiculated nodule in the RIGHT upper lobe measures 1.8 cm (image 64, series 4) and has associated metabolic activity with SUV max equal 6.8. Rounded nodule in the RIGHT upper lobe measuring 11 mm (image 68, series 4) does not have significant metabolic activity. Enlarged RIGHT lower paratracheal lymph node measuring 2.1 cm short axis and has intense metabolic activity SUV max equal 15. Additional hypermetabolic subcarinal / RIGHT peribronchial lymph node with SUV max equal 6.8. No hypermetabolic supraclavicular nodes. Enlarged prevascular lymph node measuring 11 mm  (image 63, series 4) however this is not changed from 12 mm on CT of 05/20/2015. This node has mild metabolic activity (SUV max equal 3.9). ABDOMEN/PELVIS No abnormal metabolic activity liver. Adrenal glands are normal. Pancreas and kidneys normal. No hypermetabolic abdominopelvic lymph nodes. Abdominal aortic stent graft noted. SKELETON No focal hypermetabolic activity to suggest skeletal metastasis. IMPRESSION: 1. Hypermetabolic spiculated nodule in the RIGHT upper lobe consistent with primary bronchogenic carcinoma. 2. Hypermetabolic ipsilateral metastatic mediastinal adenopathy to the paratracheal and subcarinal nodal station. 3. Low metabolic activity of prevascular lymph node is similar in size from comparison exam and therefore favored benign. These results will be called to the ordering clinician or representative by the Radiologist Assistant, and communication documented in the PACS or zVision Dashboard. Electronically Signed   By: Suzy Bouchard M.D.   On: 03/28/2017 16:47   Dg Chest Port 1 View  Result Date: 04/11/2017 CLINICAL DATA:  Status post lung biopsy. EXAM: PORTABLE CHEST 1 VIEW COMPARISON:  04/06/2017 FINDINGS: The cardiac silhouette is borderline enlarged. Lung volumes are  diminished compared to the prior study with diffusely increased interstitial markings and slight elevation of the right hemidiaphragm. Calcified pleural plaques are again noted in the right lung. No large pleural effusion or pneumothorax is identified. IMPRESSION: 1. Low lung volumes with diffusely increased interstitial markings which may reflect mild edema. 2. No pneumothorax. Electronically Signed   By: Logan Bores M.D.   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 The patient would be a good candidate for combined radiation treatments to the chest and radiosensitizing chemotherapy. The patient's PET scan was reviewed  carefully in the thoracic conference this morning, and it was recommended for coverage of the prevascular lymph node in addition to the subcarinal and paratracheal lymphadenopathy if technically feasible. We discussed the course of treatment, side effects, and potential toxicities with the patient and his wife, daughter, and granddaughter. He appears to understand and wishes to proceed with treatment. The patient does live in [place] and he would like to have his radiation treatment at the [facility].  PLAN: Patient will undergo MRI of the brain on 04/22/17. The patient will be set-up for consultation with Dr. Quitman Livings at the [facility] facility. He will proceed to have his radiosensitizing chemotherapy in [place] with Dr. Julien Nordmann. Anticipate combined therapies to begin the week of June 18th.    I spent 60 minutes minutes face to face with the patient and more than 50% of that time was spent in counseling and/or coordination of care.   ------------------------------------------------  Blair Promise, PhD, MD

## 2017-04-20 ENCOUNTER — Encounter: Payer: Self-pay | Admitting: Internal Medicine

## 2017-04-20 ENCOUNTER — Encounter: Payer: Self-pay | Admitting: *Deleted

## 2017-04-20 DIAGNOSIS — Z5111 Encounter for antineoplastic chemotherapy: Secondary | ICD-10-CM | POA: Insufficient documentation

## 2017-04-20 DIAGNOSIS — Z7189 Other specified counseling: Secondary | ICD-10-CM | POA: Insufficient documentation

## 2017-04-20 DIAGNOSIS — D509 Iron deficiency anemia, unspecified: Secondary | ICD-10-CM | POA: Insufficient documentation

## 2017-04-20 HISTORY — DX: Other specified counseling: Z71.89

## 2017-04-20 MED ORDER — INTEGRA PLUS PO CAPS: 1.0000 | ORAL_CAPSULE | Freq: Every morning | ORAL | 2 refills | Status: DC

## 2017-04-20 NOTE — Progress Notes (Signed)
Oncology Nurse Navigator Documentation  Oncology Nurse Navigator Flowsheets 04/20/2017  Navigator Location [facility]  Navigator Encounter Type Other/I received a vm message from [place] having questions about Mr. Burgher schedule.  I called and updated [place].    Treatment Phase Pre-Tx/Tx Discussion  Barriers/Navigation Needs Coordination of Care  Interventions Coordination of Care  Coordination of Care Other  Acuity Level 1  Time Spent with Patient 15

## 2017-04-20 NOTE — Progress Notes (Signed)
Oncology Nurse Navigator Documentation  Oncology Nurse Navigator Flowsheets 04/20/2017  Navigator Location [facility]  Navigator Encounter Type Clinic/MDC/I spoke with patient and family at thoracic clinic yesterday.  I gave and explained information on lung cancer, support and resource at the cancer center, and next steps.   Today, I called [facility] in [place] and updated [place] on referral.  I faxed referral form with required information.  I also updated Barnett Applebaum that Dr. Julien Nordmann would like to start concurrent chemo rad on 04/30/17.    Abnormal Finding Date 03/14/2017  Confirmed Diagnosis Date 04/11/2017  Multidisiplinary Clinic Date 04/19/2017  Treatment Initiated Date 04/30/2017  Patient Visit Type MedOnc  Treatment Phase Pre-Tx/Tx Discussion  Barriers/Navigation Needs Coordination of Care;Education  Education Understanding Cancer/ Treatment Options;Newly Diagnosed Cancer Education;Other  Interventions Coordination of Care;Education  Coordination of Care Other  Education Method Verbal;Written  Acuity Level 2  Acuity Level 2 Assistance expediting appointments;Educational needs  Time Spent with Patient 15

## 2017-04-22 ENCOUNTER — Ambulatory Visit
Admission: RE | Admit: 2017-04-22 | Discharge: 2017-04-22 | Disposition: A | Discharge status: Home / Self Care | Payer: Medicare HMO | Source: Ambulatory Visit | Attending: Cardiothoracic Surgery | Admitting: Cardiothoracic Surgery

## 2017-04-22 DIAGNOSIS — R911 Solitary pulmonary nodule: Secondary | ICD-10-CM

## 2017-04-22 MED ORDER — GADOBENATE DIMEGLUMINE 529 MG/ML IV SOLN
10.0000 mL | Freq: Once | INTRAVENOUS | Status: AC | PRN
  Administered 2017-04-22: 10 mL via INTRAVENOUS

## 2017-04-24 ENCOUNTER — Encounter: Payer: Self-pay | Admitting: *Deleted

## 2017-04-24 ENCOUNTER — Other Ambulatory Visit: Payer: Medicare HMO

## 2017-04-24 ENCOUNTER — Encounter: Payer: Self-pay | Admitting: Internal Medicine

## 2017-04-24 NOTE — Patient Instructions (Signed)
Patient in class today asking about results of MRI of brain from 6/10.   After checking with Dr. 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 Patient verbalized understanding.

## 2017-04-24 NOTE — Progress Notes (Signed)
Called patient to introduce myself as Arboriculturist. Advised patient of grants he may apply for([facility] in house) and [employer]. Advised patient proof of his household income would be needed to apply for both. Asked patient if he would be interested in applying. He states that would be great. Advised patient I will put a note in for him to see me regarding this and any other concerns he has at his next visit which is 04/30/17. Patient verbalized understanding.

## 2017-04-25 ENCOUNTER — Encounter (HOSPITAL_COMMUNITY): Payer: Self-pay

## 2017-04-26 ENCOUNTER — Encounter: Payer: Self-pay | Admitting: *Deleted

## 2017-04-26 NOTE — Progress Notes (Signed)
Oncology Nurse Navigator Documentation  Oncology Nurse Navigator Flowsheets 04/26/2017  Navigator Location [facility]  Navigator Encounter Type Other/I received a call from [employer].  They need faxed copy of patient's [place] schedule.  I faxed  Treatment Phase Pre-Tx/Tx Discussion  Barriers/Navigation Needs Coordination of Care  Interventions Coordination of Care  Coordination of Care Other  Acuity Level 1  Time Spent with Patient 30

## 2017-04-27 ENCOUNTER — Encounter (HOSPITAL_COMMUNITY): Payer: Self-pay

## 2017-04-30 ENCOUNTER — Other Ambulatory Visit (HOSPITAL_BASED_OUTPATIENT_CLINIC_OR_DEPARTMENT_OTHER): Payer: Medicare HMO

## 2017-04-30 ENCOUNTER — Encounter: Payer: Self-pay | Admitting: *Deleted

## 2017-04-30 ENCOUNTER — Encounter: Payer: Self-pay | Admitting: Internal Medicine

## 2017-04-30 ENCOUNTER — Ambulatory Visit (HOSPITAL_BASED_OUTPATIENT_CLINIC_OR_DEPARTMENT_OTHER): Payer: Medicare HMO

## 2017-04-30 VITALS — BP 136/65 | HR 76 | Temp 98.1°F | Resp 18 | Ht 70.0 in

## 2017-04-30 DIAGNOSIS — Z5111 Encounter for antineoplastic chemotherapy: Secondary | ICD-10-CM

## 2017-04-30 DIAGNOSIS — C3411 Malignant neoplasm of upper lobe, right bronchus or lung: Secondary | ICD-10-CM

## 2017-04-30 DIAGNOSIS — C3491 Malignant neoplasm of unspecified part of right bronchus or lung: Secondary | ICD-10-CM

## 2017-04-30 LAB — CBC WITH DIFFERENTIAL/PLATELET
BASO%: 0.8 % (ref 0.0–2.0)
BASOS ABS: 0.1 10*3/uL (ref 0.0–0.1)
EOS ABS: 1 10*3/uL — AB (ref 0.0–0.5)
EOS%: 8.5 % — ABNORMAL HIGH (ref 0.0–7.0)
HEMATOCRIT: 26.1 % — AB (ref 38.4–49.9)
HEMOGLOBIN: 8.2 g/dL — AB (ref 13.0–17.1)
LYMPH#: 1.6 10*3/uL (ref 0.9–3.3)
LYMPH%: 13 % — ABNORMAL LOW (ref 14.0–49.0)
MCH: 25.2 pg — AB (ref 27.2–33.4)
MCHC: 31.5 g/dL — ABNORMAL LOW (ref 32.0–36.0)
MCV: 80.2 fL (ref 79.3–98.0)
MONO#: 0.9 10*3/uL (ref 0.1–0.9)
MONO%: 7.5 % (ref 0.0–14.0)
NEUT%: 70.2 % (ref 39.0–75.0)
NEUTROS ABS: 8.4 10*3/uL — AB (ref 1.5–6.5)
Platelets: 393 10*3/uL (ref 140–400)
RBC: 3.25 10*6/uL — ABNORMAL LOW (ref 4.20–5.82)
RDW: 20.6 % — AB (ref 11.0–14.6)
WBC: 11.9 10*3/uL — AB (ref 4.0–10.3)

## 2017-04-30 LAB — COMPREHENSIVE METABOLIC PANEL
ALBUMIN: 3.7 g/dL (ref 3.5–5.0)
ALK PHOS: 101 U/L (ref 40–150)
ALT: 10 U/L (ref 0–55)
AST: 12 U/L (ref 5–34)
Anion Gap: 8 mEq/L (ref 3–11)
BUN: 15 mg/dL (ref 7.0–26.0)
CO2: 26 mEq/L (ref 22–29)
Calcium: 9.1 mg/dL (ref 8.4–10.4)
Chloride: 104 mEq/L (ref 98–109)
Creatinine: 1.6 mg/dL — ABNORMAL HIGH (ref 0.7–1.3)
EGFR: 41 mL/min/{1.73_m2} — AB (ref >90)
GLUCOSE: 188 mg/dL — AB (ref 70–140)
POTASSIUM: 3.8 meq/L (ref 3.5–5.1)
SODIUM: 138 meq/L (ref 136–145)
TOTAL PROTEIN: 6.9 g/dL (ref 6.4–8.3)

## 2017-04-30 MED ORDER — PALONOSETRON HCL INJECTION 0.25 MG/5ML
0.2500 mg | Freq: Once | INTRAVENOUS | Status: AC
  Administered 2017-04-30: 0.25 mg via INTRAVENOUS

## 2017-04-30 MED ORDER — DIPHENHYDRAMINE HCL 50 MG/ML IJ SOLN
INTRAMUSCULAR | Status: AC
  Filled 2017-04-30: qty 1

## 2017-04-30 MED ORDER — PALONOSETRON HCL INJECTION 0.25 MG/5ML
INTRAVENOUS | Status: AC
  Filled 2017-04-30: qty 5

## 2017-04-30 MED ORDER — SODIUM CHLORIDE 0.9 % IV SOLN
20.0000 mg | Freq: Once | INTRAVENOUS | Status: AC
  Administered 2017-04-30: 20 mg via INTRAVENOUS
  Filled 2017-04-30: qty 2

## 2017-04-30 MED ORDER — SODIUM CHLORIDE 0.9 % IV SOLN
45.0000 mg/m2 | Freq: Once | INTRAVENOUS | Status: AC
  Administered 2017-04-30: 102 mg via INTRAVENOUS
  Filled 2017-04-30: qty 17

## 2017-04-30 MED ORDER — DIPHENHYDRAMINE HCL 50 MG/ML IJ SOLN
50.0000 mg | Freq: Once | INTRAMUSCULAR | Status: AC
  Administered 2017-04-30: 50 mg via INTRAVENOUS

## 2017-04-30 MED ORDER — FAMOTIDINE IN NACL 20-0.9 MG/50ML-% IV SOLN
INTRAVENOUS | Status: AC
  Filled 2017-04-30: qty 50

## 2017-04-30 MED ORDER — SODIUM CHLORIDE 0.9 % IV SOLN
162.8000 mg | Freq: Once | INTRAVENOUS | Status: AC
  Administered 2017-04-30: 160 mg via INTRAVENOUS
  Filled 2017-04-30: qty 16

## 2017-04-30 MED ORDER — SODIUM CHLORIDE 0.9 % IV SOLN
Freq: Once | INTRAVENOUS | Status: AC
  Administered 2017-04-30: 14:00:00 via INTRAVENOUS

## 2017-04-30 MED ORDER — FAMOTIDINE IN NACL 20-0.9 MG/50ML-% IV SOLN
20.0000 mg | Freq: Once | INTRAVENOUS | Status: AC
  Administered 2017-04-30: 20 mg via INTRAVENOUS

## 2017-04-30 NOTE — Progress Notes (Signed)
Okay to treat with Creatinine 1.6 per Diane RN per Dr. Julien Nordmann.  Discharge instructions printed and verbally reviewed. Pt verbalizes understanding. Pt stable at discharge.

## 2017-04-30 NOTE — Progress Notes (Signed)
Met with patient and spouse to introduce myself as Arboriculturist and to offer resources. Gave him an application for [employer] and advised he may bring here to return to be emailed or drop off at address at bottom of application.  Approved for one-time $400 [place] to assist with out of pocket oral medications as well as transportation in forms of gas cards. Patient and spouse verbally gave income but will bring proof at next visit, accidentally left at home.   They have my card for any additional financial questions or concerns. Declined gas card today.

## 2017-04-30 NOTE — Progress Notes (Signed)
[place] Psychosocial Distress Screening Clinical Social Work  Clinical Social Work was referred by distress screening protocol.  The patient scored a 5 on the Psychosocial Distress Thermometer which indicates moderate distress. Clinical Social Worker contacted patient to assess for distress and other psychosocial needs. Mr. Milles reported he starts his chemo treatment today and radiation treatment on Wednesday.  The patient shared he no longer has anxiety regarding his cancer diagnosis.  He stated he's "leaving it up to God" and no longer feels the need to worry.  He shared his family feels the same way and they are all coping well.  CSW encouraged patient to call if he has any questions or concerns.  ONCBCN DISTRESS SCREENING 04/19/2017  Screening Type Initial Screening  Distress experienced in past week (1-10) 5  Emotional problem type Adjusting to illness;Nervousness/Anxiety  Information Concerns Type Lack of info about treatment   Lauren Alver Sorrow, MSW, LCSW, OSW-C Clinical Social Worker [facility] [phone]

## 2017-04-30 NOTE — Patient Instructions (Addendum)
[facility] Discharge Instructions for Patients Receiving Chemotherapy  Today you received the following chemotherapy agents Taxol and Carboplatin   To help prevent nausea and vomiting after your treatment, we encourage you to take your nausea medication as directed. If you develop nausea and vomiting that is not controlled by your nausea medication, call the clinic.   BELOW ARE SYMPTOMS THAT SHOULD BE REPORTED IMMEDIATELY:  *FEVER GREATER THAN 100.5 F  *CHILLS WITH OR WITHOUT FEVER  NAUSEA AND VOMITING THAT IS NOT CONTROLLED WITH YOUR NAUSEA MEDICATION  *UNUSUAL SHORTNESS OF BREATH  *UNUSUAL BRUISING OR BLEEDING  TENDERNESS IN MOUTH AND THROAT WITH OR WITHOUT PRESENCE OF ULCERS  *URINARY PROBLEMS  *BOWEL PROBLEMS  UNUSUAL RASH Items with * indicate a potential emergency and should be followed up as soon as possible.  Feel free to call the clinic you have any questions or concerns. The clinic phone number is [phone].  Please show the CHEMO ALERT CARD at check-in to the Emergency Department and triage nurse.  Paclitaxel injection What is this medicine? PACLITAXEL (PAK li TAX el) is a chemotherapy drug. It targets fast dividing cells, like cancer cells, and causes these cells to die. This medicine is used to treat ovarian cancer, breast cancer, and other cancers. This medicine may be used for other purposes; ask your health care provider or pharmacist if you have questions. COMMON BRAND NAME(S): Onxol, Taxol What should I tell my health care provider before I take this medicine? They need to know if you have any of these conditions: -blood disorders -irregular heartbeat -infection (especially a virus infection such as chickenpox, cold sores, or herpes) -liver disease -previous or ongoing radiation therapy -an unusual or allergic reaction to paclitaxel, alcohol, polyoxyethylated castor oil, other chemotherapy agents, other medicines, foods, dyes, or  preservatives -pregnant or trying to get pregnant -breast-feeding How should I use this medicine? This drug is given as an infusion into a vein. It is administered in a hospital or clinic by a specially trained health care professional. Talk to your pediatrician regarding the use of this medicine in children. Special care may be needed. Overdosage: If you think you have taken too much of this medicine contact a poison control center or emergency room at once. NOTE: This medicine is only for you. Do not share this medicine with others. What if I miss a dose? It is important not to miss your dose. Call your doctor or health care professional if you are unable to keep an appointment. What may interact with this medicine? Do not take this medicine with any of the following medications: -disulfiram -metronidazole This medicine may also interact with the following medications: -cyclosporine -diazepam -ketoconazole -medicines to increase blood counts like filgrastim, pegfilgrastim, sargramostim -other chemotherapy drugs like cisplatin, doxorubicin, epirubicin, etoposide, teniposide, vincristine -quinidine -testosterone -vaccines -verapamil Talk to your doctor or health care professional before taking any of these medicines: -acetaminophen -aspirin -ibuprofen -ketoprofen -naproxen This list may not describe all possible interactions. Give your health care provider a list of all the medicines, herbs, non-prescription drugs, or dietary supplements you use. Also tell them if you smoke, drink alcohol, or use illegal drugs. Some items may interact with your medicine. What should I watch for while using this medicine? Your condition will be monitored carefully while you are receiving this medicine. You will need important blood work done while you are taking this medicine. This medicine can cause serious allergic reactions. To reduce your risk you will need to take   other medicine(s) before  treatment with this medicine. If you experience allergic reactions like skin rash, itching or hives, swelling of the face, lips, or tongue, tell your doctor or health care professional right away. In some cases, you may be given additional medicines to help with side effects. Follow all directions for their use. This drug may make you feel generally unwell. This is not uncommon, as chemotherapy can affect healthy cells as well as cancer cells. Report any side effects. Continue your course of treatment even though you feel ill unless your doctor tells you to stop. Call your doctor or health care professional for advice if you get a fever, chills or sore throat, or other symptoms of a cold or flu. Do not treat yourself. This drug decreases your body's ability to fight infections. Try to avoid being around people who are sick. This medicine may increase your risk to bruise or bleed. Call your doctor or health care professional if you notice any unusual bleeding. Be careful brushing and flossing your teeth or using a toothpick because you may get an infection or bleed more easily. If you have any dental work done, tell your dentist you are receiving this medicine. Avoid taking products that contain aspirin, acetaminophen, ibuprofen, naproxen, or ketoprofen unless instructed by your doctor. These medicines may hide a fever. Do not become pregnant while taking this medicine. Women should inform their doctor if they wish to become pregnant or think they might be pregnant. There is a potential for serious side effects to an unborn child. Talk to your health care professional or pharmacist for more information. Do not breast-feed an infant while taking this medicine. Men are advised not to father a child while receiving this medicine. This product may contain alcohol. Ask your pharmacist or healthcare provider if this medicine contains alcohol. Be sure to tell all healthcare providers you are taking this medicine.  Certain medicines, like metronidazole and disulfiram, can cause an unpleasant reaction when taken with alcohol. The reaction includes flushing, headache, nausea, vomiting, sweating, and increased thirst. The reaction can last from 30 minutes to several hours. What side effects may I notice from receiving this medicine? Side effects that you should report to your doctor or health care professional as soon as possible: -allergic reactions like skin rash, itching or hives, swelling of the face, lips, or tongue -low blood counts - This drug may decrease the number of white blood cells, red blood cells and platelets. You may be at increased risk for infections and bleeding. -signs of infection - fever or chills, cough, sore throat, pain or difficulty passing urine -signs of decreased platelets or bleeding - bruising, pinpoint red spots on the skin, black, tarry stools, nosebleeds -signs of decreased red blood cells - unusually weak or tired, fainting spells, lightheadedness -breathing problems -chest pain -high or low blood pressure -mouth sores -nausea and vomiting -pain, swelling, redness or irritation at the injection site -pain, tingling, numbness in the hands or feet -slow or irregular heartbeat -swelling of the ankle, feet, hands Side effects that usually do not require medical attention (report to your doctor or health care professional if they continue or are bothersome): -bone pain -complete hair loss including hair on your head, underarms, pubic hair, eyebrows, and eyelashes -changes in the color of fingernails -diarrhea -loosening of the fingernails -loss of appetite -muscle or joint pain -red flush to skin -sweating This list may not describe all possible side effects. Call your doctor for medical advice about side   effects. You may report side effects to FDA at 1-800-FDA-1088. Where should I keep my medicine? This drug is given in a hospital or clinic and will not be stored at  home. NOTE: This sheet is a summary. It may not cover all possible information. If you have questions about this medicine, talk to your doctor, pharmacist, or health care provider.  2018 Elsevier/Gold Standard (2015-08-31 19:58:00)   Carboplatin injection What is this medicine? CARBOPLATIN (KAR boe pla tin) is a chemotherapy drug. It targets fast dividing cells, like cancer cells, and causes these cells to die. This medicine is used to treat ovarian cancer and many other cancers. This medicine may be used for other purposes; ask your health care provider or pharmacist if you have questions. COMMON BRAND NAME(S): Paraplatin What should I tell my health care provider before I take this medicine? They need to know if you have any of these conditions: -blood disorders -hearing problems -kidney disease -recent or ongoing radiation therapy -an unusual or allergic reaction to carboplatin, cisplatin, other chemotherapy, other medicines, foods, dyes, or preservatives -pregnant or trying to get pregnant -breast-feeding How should I use this medicine? This drug is usually given as an infusion into a vein. It is administered in a hospital or clinic by a specially trained health care professional. Talk to your pediatrician regarding the use of this medicine in children. Special care may be needed. Overdosage: If you think you have taken too much of this medicine contact a poison control center or emergency room at once. NOTE: This medicine is only for you. Do not share this medicine with others. What if I miss a dose? It is important not to miss a dose. Call your doctor or health care professional if you are unable to keep an appointment. What may interact with this medicine? -medicines for seizures -medicines to increase blood counts like filgrastim, pegfilgrastim, sargramostim -some antibiotics like amikacin, gentamicin, neomycin, streptomycin, tobramycin -vaccines Talk to your doctor or health  care professional before taking any of these medicines: -acetaminophen -aspirin -ibuprofen -ketoprofen -naproxen This list may not describe all possible interactions. Give your health care provider a list of all the medicines, herbs, non-prescription drugs, or dietary supplements you use. Also tell them if you smoke, drink alcohol, or use illegal drugs. Some items may interact with your medicine. What should I watch for while using this medicine? Your condition will be monitored carefully while you are receiving this medicine. You will need important blood work done while you are taking this medicine. This drug may make you feel generally unwell. This is not uncommon, as chemotherapy can affect healthy cells as well as cancer cells. Report any side effects. Continue your course of treatment even though you feel ill unless your doctor tells you to stop. In some cases, you may be given additional medicines to help with side effects. Follow all directions for their use. Call your doctor or health care professional for advice if you get a fever, chills or sore throat, or other symptoms of a cold or flu. Do not treat yourself. This drug decreases your body's ability to fight infections. Try to avoid being around people who are sick. This medicine may increase your risk to bruise or bleed. Call your doctor or health care professional if you notice any unusual bleeding. Be careful brushing and flossing your teeth or using a toothpick because you may get an infection or bleed more easily. If you have any dental work done, tell your dentist you   are receiving this medicine. Avoid taking products that contain aspirin, acetaminophen, ibuprofen, naproxen, or ketoprofen unless instructed by your doctor. These medicines may hide a fever. Do not become pregnant while taking this medicine. Women should inform their doctor if they wish to become pregnant or think they might be pregnant. There is a potential for serious  side effects to an unborn child. Talk to your health care professional or pharmacist for more information. Do not breast-feed an infant while taking this medicine. What side effects may I notice from receiving this medicine? Side effects that you should report to your doctor or health care professional as soon as possible: -allergic reactions like skin rash, itching or hives, swelling of the face, lips, or tongue -signs of infection - fever or chills, cough, sore throat, pain or difficulty passing urine -signs of decreased platelets or bleeding - bruising, pinpoint red spots on the skin, black, tarry stools, nosebleeds -signs of decreased red blood cells - unusually weak or tired, fainting spells, lightheadedness -breathing problems -changes in hearing -changes in vision -chest pain -high blood pressure -low blood counts - This drug may decrease the number of white blood cells, red blood cells and platelets. You may be at increased risk for infections and bleeding. -nausea and vomiting -pain, swelling, redness or irritation at the injection site -pain, tingling, numbness in the hands or feet -problems with balance, talking, walking -trouble passing urine or change in the amount of urine Side effects that usually do not require medical attention (report to your doctor or health care professional if they continue or are bothersome): -hair loss -loss of appetite -metallic taste in the mouth or changes in taste This list may not describe all possible side effects. Call your doctor for medical advice about side effects. You may report side effects to FDA at 1-800-FDA-1088. Where should I keep my medicine? This drug is given in a hospital or clinic and will not be stored at home. NOTE: This sheet is a summary. It may not cover all possible information. If you have questions about this medicine, talk to your doctor, pharmacist, or health care provider.  2018 Elsevier/Gold Standard (2008-02-04  14:38:05)   

## 2017-05-03 ENCOUNTER — Telehealth: Payer: Self-pay | Admitting: *Deleted

## 2017-05-03 NOTE — Addendum Note (Signed)
Addended by: Lianne Cure A on: 05/03/2017 04:46 PM   Modules accepted: Orders

## 2017-05-03 NOTE — Telephone Encounter (Signed)
Called patient for f/u of first chemo on 6/18.  States doing well.  Drinking and eating ok.  Aware to let us know if any problems.

## 2017-05-07 ENCOUNTER — Ambulatory Visit (HOSPITAL_BASED_OUTPATIENT_CLINIC_OR_DEPARTMENT_OTHER): Payer: Medicare HMO | Admitting: Nurse Practitioner

## 2017-05-07 ENCOUNTER — Ambulatory Visit (HOSPITAL_BASED_OUTPATIENT_CLINIC_OR_DEPARTMENT_OTHER): Payer: Medicare HMO

## 2017-05-07 ENCOUNTER — Other Ambulatory Visit (HOSPITAL_BASED_OUTPATIENT_CLINIC_OR_DEPARTMENT_OTHER): Payer: Medicare HMO

## 2017-05-07 ENCOUNTER — Encounter: Payer: Self-pay | Admitting: Pharmacist

## 2017-05-07 VITALS — BP 158/75 | HR 85 | Temp 97.8°F | Resp 18 | Ht 70.0 in | Wt 230.6 lb

## 2017-05-07 DIAGNOSIS — C3491 Malignant neoplasm of unspecified part of right bronchus or lung: Secondary | ICD-10-CM

## 2017-05-07 DIAGNOSIS — C3411 Malignant neoplasm of upper lobe, right bronchus or lung: Secondary | ICD-10-CM | POA: Diagnosis not present

## 2017-05-07 DIAGNOSIS — Z5111 Encounter for antineoplastic chemotherapy: Secondary | ICD-10-CM | POA: Diagnosis not present

## 2017-05-07 LAB — CBC WITH DIFFERENTIAL/PLATELET
BASO%: 0.6 % (ref 0.0–2.0)
BASOS ABS: 0.1 10*3/uL (ref 0.0–0.1)
EOS ABS: 0.7 10*3/uL — AB (ref 0.0–0.5)
EOS%: 7.6 % — ABNORMAL HIGH (ref 0.0–7.0)
HCT: 26.9 % — ABNORMAL LOW (ref 38.4–49.9)
HEMOGLOBIN: 8.5 g/dL — AB (ref 13.0–17.1)
LYMPH%: 10.2 % — ABNORMAL LOW (ref 14.0–49.0)
MCH: 26 pg — AB (ref 27.2–33.4)
MCHC: 31.5 g/dL — AB (ref 32.0–36.0)
MCV: 82.6 fL (ref 79.3–98.0)
MONO#: 0.3 10*3/uL (ref 0.1–0.9)
MONO%: 3.7 % (ref 0.0–14.0)
NEUT#: 7.2 10*3/uL — ABNORMAL HIGH (ref 1.5–6.5)
NEUT%: 77.9 % — AB (ref 39.0–75.0)
Platelets: 360 10*3/uL (ref 140–400)
RBC: 3.25 10*6/uL — ABNORMAL LOW (ref 4.20–5.82)
RDW: 21.5 % — AB (ref 11.0–14.6)
WBC: 9.2 10*3/uL (ref 4.0–10.3)
lymph#: 0.9 10*3/uL (ref 0.9–3.3)

## 2017-05-07 LAB — COMPREHENSIVE METABOLIC PANEL
ALBUMIN: 3.7 g/dL (ref 3.5–5.0)
ALK PHOS: 93 U/L (ref 40–150)
ALT: 10 U/L (ref 0–55)
AST: 14 U/L (ref 5–34)
Anion Gap: 12 mEq/L — ABNORMAL HIGH (ref 3–11)
BUN: 15.5 mg/dL (ref 7.0–26.0)
CALCIUM: 9.6 mg/dL (ref 8.4–10.4)
CO2: 27 mEq/L (ref 22–29)
Chloride: 102 mEq/L (ref 98–109)
Creatinine: 1.5 mg/dL — ABNORMAL HIGH (ref 0.7–1.3)
EGFR: 45 mL/min/{1.73_m2} — AB (ref >90)
Glucose: 203 mg/dl — ABNORMAL HIGH (ref 70–140)
POTASSIUM: 3.7 meq/L (ref 3.5–5.1)
SODIUM: 140 meq/L (ref 136–145)
Total Bilirubin: 0.25 mg/dL (ref 0.20–1.20)
Total Protein: 6.8 g/dL (ref 6.4–8.3)

## 2017-05-07 MED ORDER — SODIUM CHLORIDE 0.9 % IV SOLN
162.8000 mg | Freq: Once | INTRAVENOUS | Status: AC
  Administered 2017-05-07: 160 mg via INTRAVENOUS
  Filled 2017-05-07: qty 16

## 2017-05-07 MED ORDER — FAMOTIDINE IN NACL 20-0.9 MG/50ML-% IV SOLN
INTRAVENOUS | Status: AC
  Filled 2017-05-07: qty 50

## 2017-05-07 MED ORDER — SODIUM CHLORIDE 0.9 % IV SOLN
50.0000 mg | Freq: Once | INTRAVENOUS | Status: AC
  Administered 2017-05-07: 50 mg via INTRAVENOUS
  Filled 2017-05-07: qty 1

## 2017-05-07 MED ORDER — FAMOTIDINE IN NACL 20-0.9 MG/50ML-% IV SOLN
20.0000 mg | Freq: Once | INTRAVENOUS | Status: AC
  Administered 2017-05-07: 20 mg via INTRAVENOUS

## 2017-05-07 MED ORDER — SODIUM CHLORIDE 0.9 % IV SOLN
Freq: Once | INTRAVENOUS | Status: AC
  Administered 2017-05-07: 13:00:00 via INTRAVENOUS

## 2017-05-07 MED ORDER — SODIUM CHLORIDE 0.9 % IV SOLN
20.0000 mg | Freq: Once | INTRAVENOUS | Status: AC
  Administered 2017-05-07: 20 mg via INTRAVENOUS
  Filled 2017-05-07: qty 2

## 2017-05-07 MED ORDER — SODIUM CHLORIDE 0.9 % IV SOLN
45.0000 mg/m2 | Freq: Once | INTRAVENOUS | Status: AC
  Administered 2017-05-07: 102 mg via INTRAVENOUS
  Filled 2017-05-07: qty 17

## 2017-05-07 MED ORDER — PALONOSETRON HCL INJECTION 0.25 MG/5ML
0.2500 mg | Freq: Once | INTRAVENOUS | Status: AC
  Administered 2017-05-07: 0.25 mg via INTRAVENOUS

## 2017-05-07 MED ORDER — DIPHENHYDRAMINE HCL 50 MG/ML IJ SOLN: 50.0000 mg | Freq: Once | INTRAMUSCULAR | Status: DC

## 2017-05-07 MED ORDER — PALONOSETRON HCL INJECTION 0.25 MG/5ML
INTRAVENOUS | Status: AC
  Filled 2017-05-07: qty 5

## 2017-05-07 NOTE — Progress Notes (Signed)
  [facility] OFFICE PROGRESS NOTE   Diagnosis:  Stage IIIA (T1a, N2, M0) non-small cell lung cancer questionable for adenocarcinoma presented with right upper lobe lung nodule in addition to mediastinal lymphadenopathy diagnosed in May 2018.  INTERVAL HISTORY:   Rodney Orozco returns as scheduled. He completed cycle 1 weekly carboplatin/Taxol 04/30/2017. He began radiation 05/02/2017. He denies nausea/vomiting. No mouth sores. No diarrhea. No skin rash. No signs of an allergic reaction. No numbness or tingling in his hands or feet. He has stable dyspnea on exertion.  Objective:  Vital signs in last 24 hours:  Blood pressure (!) 158/75, pulse 85, temperature 97.8 F (36.6 C), temperature source Oral, resp. rate 18, height 5\' 10"  (1.778 m), weight 230 lb 9.6 oz (104.6 kg), SpO2 94 %.    HEENT: No thrush or ulcers. Resp: Faint inspiratory rales at both lung bases. No respiratory distress. Cardio: Regular rate and rhythm. GI: Abdomen soft and nontender. No hepatomegaly. Vascular: No leg edema.    Lab Results:  Lab Results  Component Value Date   WBC 9.2 05/07/2017   HGB 8.5 (L) 05/07/2017   HCT 26.9 (L) 05/07/2017   MCV 82.6 05/07/2017   PLT 360 05/07/2017   NEUTROABS 7.2 (H) 05/07/2017    Imaging:  No results found.  Medications: I have reviewed the patient's current medications.  Assessment/Plan: 1. Stage IIIA (T1a, N2, M0) non-small cell lung cancer questionable for adenocarcinoma presented with right upper lobe lung nodule in addition to mediastinal lymphadenopathy diagnosed in May 2018. Weekly chemotherapy with carboplatin/Taxol initiated 04/30/2017; radiation initiated 05/02/2017.  Disposition: Rodney Orozco appears stable. He has completed 1 cycle of weekly carboplatin/Taxol which he tolerated well. Plan to proceed with week 2 today as scheduled. He continues radiation. We will see him in follow-up in 2 weeks.  Plan reviewed with Dr. Julien Nordmann.    Ned Card ANP/GNP-BC   05/07/2017  11:31 AM

## 2017-05-07 NOTE — Patient Instructions (Addendum)
[facility] Discharge Instructions for Patients Receiving Chemotherapy  Today you received the following chemotherapy agents Taxol and Carboplatin. To help prevent nausea and vomiting after your treatment, we encourage you to take your nausea medication as directed.  If you develop nausea and vomiting that is not controlled by your nausea medication, call the clinic.   BELOW ARE SYMPTOMS THAT SHOULD BE REPORTED IMMEDIATELY:  *FEVER GREATER THAN 100.5 F  *CHILLS WITH OR WITHOUT FEVER  NAUSEA AND VOMITING THAT IS NOT CONTROLLED WITH YOUR NAUSEA MEDICATION  *UNUSUAL SHORTNESS OF BREATH  *UNUSUAL BRUISING OR BLEEDING  TENDERNESS IN MOUTH AND THROAT WITH OR WITHOUT PRESENCE OF ULCERS  *URINARY PROBLEMS  *BOWEL PROBLEMS  UNUSUAL RASH Items with * indicate a potential emergency and should be followed up as soon as possible.  Feel free to call the clinic you have any questions or concerns. The clinic phone number is [phone].  Please show the CHEMO ALERT CARD at check-in to the Emergency Department and triage nurse.    

## 2017-05-08 ENCOUNTER — Encounter: Payer: Self-pay | Admitting: Internal Medicine

## 2017-05-08 NOTE — Progress Notes (Signed)
Proof of income was left in my box.  Will scan in [employer]point in case needed for other assistance.

## 2017-05-14 ENCOUNTER — Ambulatory Visit (HOSPITAL_BASED_OUTPATIENT_CLINIC_OR_DEPARTMENT_OTHER): Payer: Medicare HMO

## 2017-05-14 ENCOUNTER — Other Ambulatory Visit (HOSPITAL_BASED_OUTPATIENT_CLINIC_OR_DEPARTMENT_OTHER): Payer: Medicare HMO

## 2017-05-14 VITALS — BP 164/82 | HR 81 | Temp 97.9°F | Resp 17

## 2017-05-14 DIAGNOSIS — C3411 Malignant neoplasm of upper lobe, right bronchus or lung: Secondary | ICD-10-CM | POA: Diagnosis not present

## 2017-05-14 DIAGNOSIS — Z5111 Encounter for antineoplastic chemotherapy: Secondary | ICD-10-CM

## 2017-05-14 DIAGNOSIS — C3491 Malignant neoplasm of unspecified part of right bronchus or lung: Secondary | ICD-10-CM

## 2017-05-14 LAB — COMPREHENSIVE METABOLIC PANEL
ALT: 12 U/L (ref 0–55)
AST: 13 U/L (ref 5–34)
Albumin: 3.8 g/dL (ref 3.5–5.0)
Alkaline Phosphatase: 86 U/L (ref 40–150)
Anion Gap: 10 mEq/L (ref 3–11)
BUN: 16.4 mg/dL (ref 7.0–26.0)
CHLORIDE: 104 meq/L (ref 98–109)
CO2: 27 mEq/L (ref 22–29)
Calcium: 9.8 mg/dL (ref 8.4–10.4)
Creatinine: 1.4 mg/dL — ABNORMAL HIGH (ref 0.7–1.3)
EGFR: 48 mL/min/{1.73_m2} — AB (ref >90)
Glucose: 129 mg/dl (ref 70–140)
POTASSIUM: 3.8 meq/L (ref 3.5–5.1)
SODIUM: 141 meq/L (ref 136–145)
Total Bilirubin: 0.24 mg/dL (ref 0.20–1.20)
Total Protein: 6.8 g/dL (ref 6.4–8.3)

## 2017-05-14 LAB — CBC WITH DIFFERENTIAL/PLATELET
BASO%: 0.9 % (ref 0.0–2.0)
Basophils Absolute: 0.1 10*3/uL (ref 0.0–0.1)
EOS ABS: 0.3 10*3/uL (ref 0.0–0.5)
EOS%: 5.5 % (ref 0.0–7.0)
HEMATOCRIT: 25.7 % — AB (ref 38.4–49.9)
HGB: 8.2 g/dL — ABNORMAL LOW (ref 13.0–17.1)
LYMPH%: 8.7 % — ABNORMAL LOW (ref 14.0–49.0)
MCH: 27 pg — AB (ref 27.2–33.4)
MCHC: 32.1 g/dL (ref 32.0–36.0)
MCV: 84.3 fL (ref 79.3–98.0)
MONO#: 0.4 10*3/uL (ref 0.1–0.9)
MONO%: 5.9 % (ref 0.0–14.0)
NEUT#: 4.9 10*3/uL (ref 1.5–6.5)
NEUT%: 79 % — AB (ref 39.0–75.0)
Platelets: 312 10*3/uL (ref 140–400)
RBC: 3.04 10*6/uL — ABNORMAL LOW (ref 4.20–5.82)
RDW: 22 % — ABNORMAL HIGH (ref 11.0–14.6)
WBC: 6.2 10*3/uL (ref 4.0–10.3)
lymph#: 0.5 10*3/uL — ABNORMAL LOW (ref 0.9–3.3)

## 2017-05-14 MED ORDER — PALONOSETRON HCL INJECTION 0.25 MG/5ML
0.2500 mg | Freq: Once | INTRAVENOUS | Status: AC
  Administered 2017-05-14: 0.25 mg via INTRAVENOUS

## 2017-05-14 MED ORDER — SODIUM CHLORIDE 0.9 % IV SOLN
50.0000 mg | Freq: Once | INTRAVENOUS | Status: AC
  Administered 2017-05-14: 50 mg via INTRAVENOUS
  Filled 2017-05-14: qty 1

## 2017-05-14 MED ORDER — DIPHENHYDRAMINE HCL 50 MG/ML IJ SOLN
INTRAMUSCULAR | Status: AC
  Filled 2017-05-14: qty 1

## 2017-05-14 MED ORDER — SODIUM CHLORIDE 0.9 % IV SOLN
Freq: Once | INTRAVENOUS | Status: AC
  Administered 2017-05-14: 14:00:00 via INTRAVENOUS

## 2017-05-14 MED ORDER — FAMOTIDINE IN NACL 20-0.9 MG/50ML-% IV SOLN
INTRAVENOUS | Status: AC
  Filled 2017-05-14: qty 50

## 2017-05-14 MED ORDER — SODIUM CHLORIDE 0.9 % IV SOLN
20.0000 mg | Freq: Once | INTRAVENOUS | Status: AC
  Administered 2017-05-14: 20 mg via INTRAVENOUS
  Filled 2017-05-14: qty 2

## 2017-05-14 MED ORDER — FAMOTIDINE IN NACL 20-0.9 MG/50ML-% IV SOLN
20.0000 mg | Freq: Once | INTRAVENOUS | Status: AC
  Administered 2017-05-14: 20 mg via INTRAVENOUS

## 2017-05-14 MED ORDER — PALONOSETRON HCL INJECTION 0.25 MG/5ML
INTRAVENOUS | Status: AC
  Filled 2017-05-14: qty 5

## 2017-05-14 MED ORDER — PACLITAXEL CHEMO INJECTION 300 MG/50ML
45.0000 mg/m2 | Freq: Once | INTRAVENOUS | Status: AC
  Administered 2017-05-14: 102 mg via INTRAVENOUS
  Filled 2017-05-14: qty 17

## 2017-05-14 MED ORDER — SODIUM CHLORIDE 0.9 % IV SOLN
160.0000 mg | Freq: Once | INTRAVENOUS | Status: AC
  Administered 2017-05-14: 160 mg via INTRAVENOUS
  Filled 2017-05-14: qty 16

## 2017-05-14 NOTE — Patient Instructions (Signed)
[facility] Discharge Instructions for Patients Receiving Chemotherapy  Today you received the following chemotherapy agents :  Taxol,  Carboplatin.  To help prevent nausea and vomiting after your treatment, we encourage you to take your nausea medication as prescribed.   If you develop nausea and vomiting that is not controlled by your nausea medication, call the clinic.   BELOW ARE SYMPTOMS THAT SHOULD BE REPORTED IMMEDIATELY:  *FEVER GREATER THAN 100.5 F  *CHILLS WITH OR WITHOUT FEVER  NAUSEA AND VOMITING THAT IS NOT CONTROLLED WITH YOUR NAUSEA MEDICATION  *UNUSUAL SHORTNESS OF BREATH  *UNUSUAL BRUISING OR BLEEDING  TENDERNESS IN MOUTH AND THROAT WITH OR WITHOUT PRESENCE OF ULCERS  *URINARY PROBLEMS  *BOWEL PROBLEMS  UNUSUAL RASH Items with * indicate a potential emergency and should be followed up as soon as possible.  Feel free to call the clinic you have any questions or concerns. The clinic phone number is [phone].  Please show the CHEMO ALERT CARD at check-in to the Emergency Department and triage nurse.   

## 2017-05-15 ENCOUNTER — Other Ambulatory Visit: Payer: Self-pay | Admitting: *Deleted

## 2017-05-15 DIAGNOSIS — C3491 Malignant neoplasm of unspecified part of right bronchus or lung: Secondary | ICD-10-CM

## 2017-05-21 ENCOUNTER — Ambulatory Visit (HOSPITAL_COMMUNITY)
Admission: RE | Admit: 2017-05-21 | Discharge: 2017-05-21 | Disposition: A | Discharge status: Home / Self Care | Payer: Medicare HMO | Source: Ambulatory Visit | Attending: Internal Medicine | Admitting: Internal Medicine

## 2017-05-21 ENCOUNTER — Other Ambulatory Visit: Payer: Self-pay | Admitting: Medical Oncology

## 2017-05-21 ENCOUNTER — Ambulatory Visit: Payer: Medicare HMO

## 2017-05-21 ENCOUNTER — Other Ambulatory Visit (HOSPITAL_BASED_OUTPATIENT_CLINIC_OR_DEPARTMENT_OTHER): Payer: Medicare HMO

## 2017-05-21 ENCOUNTER — Ambulatory Visit (HOSPITAL_BASED_OUTPATIENT_CLINIC_OR_DEPARTMENT_OTHER): Payer: Medicare HMO

## 2017-05-21 VITALS — BP 155/74 | HR 80 | Temp 97.5°F | Resp 18

## 2017-05-21 DIAGNOSIS — Z5111 Encounter for antineoplastic chemotherapy: Secondary | ICD-10-CM

## 2017-05-21 DIAGNOSIS — D649 Anemia, unspecified: Secondary | ICD-10-CM | POA: Insufficient documentation

## 2017-05-21 DIAGNOSIS — C3411 Malignant neoplasm of upper lobe, right bronchus or lung: Secondary | ICD-10-CM

## 2017-05-21 DIAGNOSIS — C3491 Malignant neoplasm of unspecified part of right bronchus or lung: Secondary | ICD-10-CM

## 2017-05-21 LAB — CBC WITH DIFFERENTIAL/PLATELET
BASO%: 0.7 % (ref 0.0–2.0)
Basophils Absolute: 0 10*3/uL (ref 0.0–0.1)
EOS%: 3.7 % (ref 0.0–7.0)
Eosinophils Absolute: 0.2 10*3/uL (ref 0.0–0.5)
HEMATOCRIT: 25 % — AB (ref 38.4–49.9)
HEMOGLOBIN: 7.9 g/dL — AB (ref 13.0–17.1)
LYMPH#: 0.4 10*3/uL — AB (ref 0.9–3.3)
LYMPH%: 8.4 % — ABNORMAL LOW (ref 14.0–49.0)
MCH: 27.5 pg (ref 27.2–33.4)
MCHC: 31.5 g/dL — ABNORMAL LOW (ref 32.0–36.0)
MCV: 87.3 fL (ref 79.3–98.0)
MONO#: 0.4 10*3/uL (ref 0.1–0.9)
MONO%: 7.6 % (ref 0.0–14.0)
NEUT#: 4.2 10*3/uL (ref 1.5–6.5)
NEUT%: 79.6 % — ABNORMAL HIGH (ref 39.0–75.0)
Platelets: 267 10*3/uL (ref 140–400)
RBC: 2.86 10*6/uL — ABNORMAL LOW (ref 4.20–5.82)
RDW: 24.7 % — AB (ref 11.0–14.6)
WBC: 5.3 10*3/uL (ref 4.0–10.3)

## 2017-05-21 LAB — COMPREHENSIVE METABOLIC PANEL
ALBUMIN: 3.7 g/dL (ref 3.5–5.0)
ALK PHOS: 83 U/L (ref 40–150)
ALT: 13 U/L (ref 0–55)
ANION GAP: 8 meq/L (ref 3–11)
AST: 12 U/L (ref 5–34)
BUN: 15.8 mg/dL (ref 7.0–26.0)
CALCIUM: 9.5 mg/dL (ref 8.4–10.4)
CO2: 27 mEq/L (ref 22–29)
Chloride: 104 mEq/L (ref 98–109)
Creatinine: 1.4 mg/dL — ABNORMAL HIGH (ref 0.7–1.3)
EGFR: 48 mL/min/{1.73_m2} — ABNORMAL LOW (ref >90)
GLUCOSE: 152 mg/dL — AB (ref 70–140)
Potassium: 3.9 mEq/L (ref 3.5–5.1)
SODIUM: 139 meq/L (ref 136–145)
TOTAL PROTEIN: 6.5 g/dL (ref 6.4–8.3)

## 2017-05-21 LAB — ABO/RH: ABO/RH(D): A NEG

## 2017-05-21 MED ORDER — SODIUM CHLORIDE 0.9 % IV SOLN
45.0000 mg/m2 | Freq: Once | INTRAVENOUS | Status: AC
  Administered 2017-05-21: 102 mg via INTRAVENOUS
  Filled 2017-05-21: qty 17

## 2017-05-21 MED ORDER — DEXAMETHASONE SODIUM PHOSPHATE 100 MG/10ML IJ SOLN
20.0000 mg | Freq: Once | INTRAMUSCULAR | Status: AC
  Administered 2017-05-21: 20 mg via INTRAVENOUS
  Filled 2017-05-21: qty 2

## 2017-05-21 MED ORDER — SODIUM CHLORIDE 0.9 % IV SOLN
Freq: Once | INTRAVENOUS | Status: AC
  Administered 2017-05-21: 15:00:00 via INTRAVENOUS

## 2017-05-21 MED ORDER — FAMOTIDINE IN NACL 20-0.9 MG/50ML-% IV SOLN
20.0000 mg | Freq: Once | INTRAVENOUS | Status: AC
  Administered 2017-05-21: 20 mg via INTRAVENOUS

## 2017-05-21 MED ORDER — SODIUM CHLORIDE 0.9 % IV SOLN
50.0000 mg | Freq: Once | INTRAVENOUS | Status: AC
  Administered 2017-05-21: 50 mg via INTRAVENOUS
  Filled 2017-05-21: qty 1

## 2017-05-21 MED ORDER — PALONOSETRON HCL INJECTION 0.25 MG/5ML
0.2500 mg | Freq: Once | INTRAVENOUS | Status: AC
  Administered 2017-05-21: 0.25 mg via INTRAVENOUS

## 2017-05-21 MED ORDER — PALONOSETRON HCL INJECTION 0.25 MG/5ML
INTRAVENOUS | Status: AC
  Filled 2017-05-21: qty 5

## 2017-05-21 MED ORDER — FAMOTIDINE IN NACL 20-0.9 MG/50ML-% IV SOLN
INTRAVENOUS | Status: AC
  Filled 2017-05-21: qty 50

## 2017-05-21 MED ORDER — SODIUM CHLORIDE 0.9 % IV SOLN
162.8000 mg | Freq: Once | INTRAVENOUS | Status: AC
  Administered 2017-05-21: 160 mg via INTRAVENOUS
  Filled 2017-05-21: qty 16

## 2017-05-21 NOTE — Patient Instructions (Signed)
[facility] Discharge Instructions for Patients Receiving Chemotherapy  Today you received the following chemotherapy agents Taxol and Carboplatin. To help prevent nausea and vomiting after your treatment, we encourage you to take your nausea medication as directed.  If you develop nausea and vomiting that is not controlled by your nausea medication, call the clinic.   BELOW ARE SYMPTOMS THAT SHOULD BE REPORTED IMMEDIATELY:  *FEVER GREATER THAN 100.5 F  *CHILLS WITH OR WITHOUT FEVER  NAUSEA AND VOMITING THAT IS NOT CONTROLLED WITH YOUR NAUSEA MEDICATION  *UNUSUAL SHORTNESS OF BREATH  *UNUSUAL BRUISING OR BLEEDING  TENDERNESS IN MOUTH AND THROAT WITH OR WITHOUT PRESENCE OF ULCERS  *URINARY PROBLEMS  *BOWEL PROBLEMS  UNUSUAL RASH Items with * indicate a potential emergency and should be followed up as soon as possible.  Feel free to call the clinic you have any questions or concerns. The clinic phone number is [phone].  Please show the CHEMO ALERT CARD at check-in to the Emergency Department and triage nurse.    

## 2017-05-22 ENCOUNTER — Other Ambulatory Visit: Payer: Self-pay | Admitting: Radiology

## 2017-05-22 ENCOUNTER — Other Ambulatory Visit: Payer: Self-pay | Admitting: Physician Assistant

## 2017-05-22 NOTE — Progress Notes (Signed)
05/21/17 1430: Per Dr. Julien Nordmann okay to treat with Hemoglobin of 7.9, pt to receive 2 units PRBCs this week. Pt aware and verbalizes understanding.

## 2017-05-23 ENCOUNTER — Ambulatory Visit (HOSPITAL_COMMUNITY)
Admission: RE | Admit: 2017-05-23 | Discharge: 2017-05-23 | Disposition: A | Discharge status: Home / Self Care | Payer: Medicare HMO | Source: Ambulatory Visit | Attending: Internal Medicine | Admitting: Internal Medicine

## 2017-05-23 ENCOUNTER — Telehealth: Payer: Self-pay | Admitting: *Deleted

## 2017-05-23 ENCOUNTER — Other Ambulatory Visit: Payer: Self-pay | Admitting: Internal Medicine

## 2017-05-23 ENCOUNTER — Encounter (HOSPITAL_COMMUNITY): Payer: Self-pay

## 2017-05-23 DIAGNOSIS — G4733 Obstructive sleep apnea (adult) (pediatric): Secondary | ICD-10-CM | POA: Insufficient documentation

## 2017-05-23 DIAGNOSIS — Z7902 Long term (current) use of antithrombotics/antiplatelets: Secondary | ICD-10-CM | POA: Insufficient documentation

## 2017-05-23 DIAGNOSIS — E782 Mixed hyperlipidemia: Secondary | ICD-10-CM | POA: Insufficient documentation

## 2017-05-23 DIAGNOSIS — E559 Vitamin D deficiency, unspecified: Secondary | ICD-10-CM | POA: Insufficient documentation

## 2017-05-23 DIAGNOSIS — Z8673 Personal history of transient ischemic attack (TIA), and cerebral infarction without residual deficits: Secondary | ICD-10-CM | POA: Diagnosis not present

## 2017-05-23 DIAGNOSIS — Z955 Presence of coronary angioplasty implant and graft: Secondary | ICD-10-CM | POA: Diagnosis not present

## 2017-05-23 DIAGNOSIS — Z7984 Long term (current) use of oral hypoglycemic drugs: Secondary | ICD-10-CM | POA: Insufficient documentation

## 2017-05-23 DIAGNOSIS — C3491 Malignant neoplasm of unspecified part of right bronchus or lung: Secondary | ICD-10-CM | POA: Diagnosis present

## 2017-05-23 DIAGNOSIS — Z86711 Personal history of pulmonary embolism: Secondary | ICD-10-CM | POA: Insufficient documentation

## 2017-05-23 DIAGNOSIS — I480 Paroxysmal atrial fibrillation: Secondary | ICD-10-CM | POA: Diagnosis not present

## 2017-05-23 DIAGNOSIS — I429 Cardiomyopathy, unspecified: Secondary | ICD-10-CM | POA: Insufficient documentation

## 2017-05-23 DIAGNOSIS — Z7982 Long term (current) use of aspirin: Secondary | ICD-10-CM | POA: Diagnosis not present

## 2017-05-23 DIAGNOSIS — Z87891 Personal history of nicotine dependence: Secondary | ICD-10-CM | POA: Diagnosis not present

## 2017-05-23 DIAGNOSIS — F0390 Unspecified dementia without behavioral disturbance: Secondary | ICD-10-CM | POA: Insufficient documentation

## 2017-05-23 DIAGNOSIS — Z9981 Dependence on supplemental oxygen: Secondary | ICD-10-CM | POA: Diagnosis not present

## 2017-05-23 DIAGNOSIS — Z8719 Personal history of other diseases of the digestive system: Secondary | ICD-10-CM | POA: Diagnosis not present

## 2017-05-23 DIAGNOSIS — E119 Type 2 diabetes mellitus without complications: Secondary | ICD-10-CM | POA: Diagnosis not present

## 2017-05-23 DIAGNOSIS — Z880 Allergy status to penicillin: Secondary | ICD-10-CM | POA: Insufficient documentation

## 2017-05-23 DIAGNOSIS — J45909 Unspecified asthma, uncomplicated: Secondary | ICD-10-CM | POA: Diagnosis not present

## 2017-05-23 DIAGNOSIS — I10 Essential (primary) hypertension: Secondary | ICD-10-CM | POA: Insufficient documentation

## 2017-05-23 DIAGNOSIS — M1990 Unspecified osteoarthritis, unspecified site: Secondary | ICD-10-CM | POA: Insufficient documentation

## 2017-05-23 HISTORY — PX: IR US GUIDE VASC ACCESS LEFT: IMG2389

## 2017-05-23 HISTORY — PX: IR IMAGING GUIDED PORT INSERTION: IMG5740

## 2017-05-23 LAB — GLUCOSE, CAPILLARY
GLUCOSE-CAPILLARY: 128 mg/dL — AB (ref 65–99)
Glucose-Capillary: 141 mg/dL — ABNORMAL HIGH (ref 65–99)

## 2017-05-23 LAB — APTT: aPTT: 28 seconds (ref 24–36)

## 2017-05-23 LAB — PROTIME-INR
INR: 0.93
Prothrombin Time: 12.4 seconds (ref 11.4–15.2)

## 2017-05-23 LAB — CBC
HCT: 24.7 % — ABNORMAL LOW (ref 39.0–52.0)
Hemoglobin: 7.7 g/dL — ABNORMAL LOW (ref 13.0–17.0)
MCH: 27.5 pg (ref 26.0–34.0)
MCHC: 31.2 g/dL (ref 30.0–36.0)
MCV: 88.2 fL (ref 78.0–100.0)
PLATELETS: 253 10*3/uL (ref 150–400)
RBC: 2.8 MIL/uL — AB (ref 4.22–5.81)
RDW: 24.2 % — AB (ref 11.5–15.5)
WBC: 6.3 10*3/uL (ref 4.0–10.5)

## 2017-05-23 LAB — PREPARE RBC (CROSSMATCH)

## 2017-05-23 MED ORDER — MIDAZOLAM HCL 2 MG/2ML IJ SOLN
INTRAMUSCULAR | Status: AC | PRN
  Administered 2017-05-23 (×2): 1 mg via INTRAVENOUS

## 2017-05-23 MED ORDER — HEPARIN SOD (PORK) LOCK FLUSH 100 UNIT/ML IV SOLN
INTRAVENOUS | Status: AC
  Filled 2017-05-23: qty 5

## 2017-05-23 MED ORDER — VANCOMYCIN HCL IN DEXTROSE 1-5 GM/200ML-% IV SOLN
INTRAVENOUS | Status: AC
  Administered 2017-05-23: 1000 mg via INTRAVENOUS
  Filled 2017-05-23: qty 200

## 2017-05-23 MED ORDER — MIDAZOLAM HCL 2 MG/2ML IJ SOLN
INTRAMUSCULAR | Status: AC
  Filled 2017-05-23: qty 4

## 2017-05-23 MED ORDER — FENTANYL CITRATE (PF) 100 MCG/2ML IJ SOLN
INTRAMUSCULAR | Status: DC
Start: 2017-05-23 — End: 2017-05-24
  Filled 2017-05-23: qty 4

## 2017-05-23 MED ORDER — LIDOCAINE HCL 1 % IJ SOLN
INTRAMUSCULAR | Status: AC
  Filled 2017-05-23: qty 20

## 2017-05-23 MED ORDER — LIDOCAINE HCL 1 % IJ SOLN
INTRAMUSCULAR | Status: AC | PRN
  Administered 2017-05-23: 10 mL

## 2017-05-23 MED ORDER — VANCOMYCIN HCL IN DEXTROSE 1-5 GM/200ML-% IV SOLN
1000.0000 mg | Freq: Once | INTRAVENOUS | Status: AC
  Administered 2017-05-23: 1000 mg via INTRAVENOUS

## 2017-05-23 MED ORDER — SODIUM CHLORIDE 0.9 % IV SOLN
INTRAVENOUS | Status: DC
  Administered 2017-05-23: 13:00:00 via INTRAVENOUS

## 2017-05-23 MED ORDER — FENTANYL CITRATE (PF) 100 MCG/2ML IJ SOLN
INTRAMUSCULAR | Status: AC | PRN
  Administered 2017-05-23 (×2): 50 ug via INTRAVENOUS

## 2017-05-23 NOTE — Telephone Encounter (Signed)
[place] called to say that patient is due to have PAC placed at 2:30 and then be here at 3:30 for a blood transfusion.  Interventional lRradiology is running behind and patient will not be here by 3:30.   Discussed with Abelina Bachelor RN with Dr. Julien Nordmann.  Will cancel appt.for transfusion today and Diane will see what she can do about getting two units of blood in tomorrow.   Patient was T & C on 7/9, so this will run out tomorrow 9&/12).     [place] back [phone] and she will let patient know not to come to [facility] today for transfusion but to keep appt.for tomorrow.

## 2017-05-23 NOTE — Procedures (Signed)
Interventional Radiology Procedure Note  Procedure: Placement of a right IJ approach single lumen PowerPort.  Tip is positioned at the superior cavoatrial junction and catheter is ready for immediate use.  Complications: No immediate Recommendations:  - Ok to shower tomorrow - Do not submerge for 7 days - Routine line care   Lyndy Russman T. Nyaira Hodgens, M.D Pager:  [phone]   

## 2017-05-23 NOTE — Consult Note (Signed)
Chief Complaint: Patient was seen in consultation today for port a cath placement.  Referring Physician(s): Mohamed,Mohamed  Supervising Physician: Aletta Edouard  Patient Status: [facility] - Out-pt  History of Present Illness: Rodney Orozco is a 76 y.o. male with stage III A right lung cancer.  Currently receiving chemoradiation therapy.  Presents today for port a cath placement.  Past Medical History:  Diagnosis Date  . AAA (abdominal aortic aneurysm) ([place]) 2017  . Asthma   . Cardiomyopathy ([place])   . Dementia   . Dyslipidemia   . GI bleed   . Goals of care, counseling/discussion 04/20/2017  . HCAP (healthcare-associated pneumonia) 12/2013   01/06/2014  . Hypertension   . Mixed hyperlipidemia   . On home oxygen therapy    2L during the day  . OSA on CPAP    actually BIPAP  . Osteoarthritis   . Paroxysmal atrial fibrillation (HCC)    takes Warfarin  . Pneumonia December 2014   Had hemoptysis and admitted at [facility].  . Pulmonary embolism ([place]) 12/2014   S/P knee replacement/notes 08/31/2016  . TIA (transient ischemic attack)   . Type II diabetes mellitus ([place])   . Vitamin D deficiency     Past Surgical History:  Procedure Laterality Date  . ABDOMINAL AORTIC ANEURYSM REPAIR  08/31/2016  . ABDOMINAL AORTIC ENDOVASCULAR STENT GRAFT N/A 08/31/2016   Procedure: ABDOMINAL AORTIC ENDOVASCULAR STENT GRAFT and Open Exposure left common femoral artery;  Surgeon: Angelia Mould, MD;  Location: [facility] OR;  Service: Vascular;  Laterality: N/A;  . ABDOMINAL SURGERY  1999   "for aneurysm"  . ABDOMINAL SURGERY     bleeding PUD in setting of aspirin powders. REMOTE. also H.pylori positive per epic notes  . BACK SURGERY    . CARDIAC CATHETERIZATION N/A 12/05/2016   Procedure: Right/Left Heart Cath and Coronary Angiography;  Surgeon: Jettie Booze, MD;  Location: [place] [facility];  Service: Cardiovascular;  Laterality: N/A;  . CARDIAC CATHETERIZATION N/A 12/05/2016    Procedure: Coronary Stent Intervention;  Surgeon: Jettie Booze, MD;  Location: [place];  Service: Cardiovascular;  Laterality: N/A;  . COLONOSCOPY N/A 01/28/2017   Procedure: COLONOSCOPY;  Surgeon: Rogene Houston, MD;  Location: [facility] SUITE;  Service: Endoscopy;  Laterality: N/A;  . COLONOSCOPY WITH ESOPHAGOGASTRODUODENOSCOPY (EGD)  2004   Dr. Amedeo Plenty: antral gastritis, hypersplastic rectal polyp, few scattered diverticula  . ESOPHAGOGASTRODUODENOSCOPY N/A 01/27/2017   Procedure: ESOPHAGOGASTRODUODENOSCOPY (EGD);  Surgeon: Rogene Houston, MD;  Location: [facility];  Service: Endoscopy;  Laterality: N/A;  . JOINT REPLACEMENT    . [place]  . TOTAL KNEE ARTHROPLASTY Bilateral Feb 2016 - Dec 2016  . VIDEO BRONCHOSCOPY WITH ENDOBRONCHIAL ULTRASOUND N/A 04/11/2017   Procedure: VIDEO BRONCHOSCOPY WITH ENDOBRONCHIAL ULTRASOUND;  Surgeon: Ivin Poot, MD;  Location: [place];  Service: Thoracic;  Laterality: N/A;    Allergies: Codeine; Tramadol; Warfarin and related; Lipitor [atorvastatin]; and Penicillins  Medications: Prior to Admission medications   Medication Sig Start Date End Date Taking? Authorizing Provider  acetaminophen (TYLENOL) 500 MG tablet Take 1,000 mg by mouth 2 (two) times daily as needed for headache.   Yes [provider]  amLODipine (NORVASC) 5 MG tablet Take 1 tablet (5 mg total) by mouth daily. 12/07/16  Yes Arbutus Leas, NP  aspirin EC 81 MG tablet Take 1 tablet (81 mg total) by mouth daily. 02/08/17  Yes Erlene Quan, PA-C  clopidogrel (PLAVIX) 75 MG tablet Take 1 tablet (75 mg total) by mouth daily. 12/06/16  Yes Arbutus Leas, NP  donepezil (ARICEPT) 10 MG tablet Take 1 tablet (10 mg total) by mouth at bedtime. 04/25/16  Yes Melvenia Beam, MD  famotidine (PEPCID) 20 MG tablet Take 20 mg by mouth 2 (two) times daily.  06/30/16  Yes [provider]  flintstones complete (FLINTSTONES) 60 MG chewable tablet Chew 1  tablet by mouth 2 (two) times daily. 03/13/17  Yes Rehman, Mechele Dawley, MD  furosemide (LASIX) 40 MG tablet Take 1 tablet (40 mg total) by mouth daily. 01/29/17  Yes Johnson, Clanford L, MD  losartan (COZAAR) 100 MG tablet Take 100 mg by mouth daily. 02/25/16  Yes [provider]  metFORMIN (GLUCOPHAGE) 1000 MG tablet Take 1,000 mg by mouth 2 (two) times daily.    Yes [provider]  metoprolol tartrate (LOPRESSOR) 25 MG tablet Take 25 mg by mouth 2 (two) times daily.  06/30/16  Yes [provider]  NOVOLIN N RELION 100 UNIT/ML injection Inject 0.05 mLs (5 Units total) into the skin 2 (two) times daily. Patient taking differently: Inject 10 Units into the skin 2 (two) times daily.  01/29/17  Yes Johnson, Clanford L, MD  VICTOZA 18 MG/3ML SOPN Inject 1.2 mg into the skin every morning.  11/15/16  Yes [provider]  FeFum-FePoly-FA-B Cmp-C-Biot (INTEGRA PLUS) CAPS Take 1 capsule by mouth every morning. 04/20/17   Curt Bears, MD  nitroGLYCERIN (NITROSTAT) 0.4 MG SL tablet Place 1 tablet (0.4 mg total) under the tongue every 5 (five) minutes as needed for chest pain. MAX 3 dose Patient not taking: Reported on 04/19/2017 12/08/16   Lorretta Harp, MD  OXYGEN Inhale 2 L into the lungs at bedtime. 2lpm 24/7  DME- [facility] Romans     [provider]  prochlorperazine (COMPAZINE) 10 MG tablet Take 1 tablet (10 mg total) by mouth every 6 (six) hours as needed for nausea or vomiting. 04/19/17   Curt Bears, MD     Family History  Problem Relation Age of Onset  . Dementia Mother   . Stroke Mother   . Heart attack Father     Social History   Social History  . Marital status: Married    Spouse name: [name]  . Number of children: 3  . Years of education: 40   Occupational History  . Retired    Social History Main Topics  . Smoking status: Former Smoker    Packs/day: 2.00    Years: 50.00    Types: Cigarettes    Quit date: 11/07/2013  . Smokeless tobacco:  Never Used  . Alcohol use No  . Drug use: No     Comment: "I do not inhale"  . Sexual activity: Not Asked   Other Topics Concern  . None   Social History Narrative   Lives with wife   Caffeine use: 4 cups /day (coffee)    Review of Systems  Currently denies fever, headache, chest pain, SOB, cough, abd pain, nausea, vomiting, back pain.  Vital Signs: BP (!) 143/73   Pulse 94   Temp 97.9 F (36.6 C) (Oral)   Resp 20   Ht 5\' 10"  (1.778 m)   Wt 230 lb (104.3 kg)   SpO2 95%   BMI 33.00 kg/m   Physical Exam  Alert and oriented.  Heart normal rate and rhythm, no MGR noted.  Lung sounds diminished in all fields, CTAB, no  adventitious sounds.  Normoactive BS in all four quadrants, no TTP.  Mallampati Score:     Imaging: No results found.  Labs:  CBC:  Recent Labs  05/07/17 1038 05/14/17 1250 05/21/17 1302 05/23/17 1300  WBC 9.2 6.2 5.3 6.3  HGB 8.5* 8.2* 7.9* 7.7*  HCT 26.9* 25.7* 25.0* 24.7*  PLT 360 312 267 253    COAGS:  Recent Labs  08/29/16 1256 08/31/16 1045  01/26/17 0416 01/27/17 0657 01/28/17 0703 04/06/17 1129  INR 1.03  --   < > 2.36 1.68 1.24 0.97  APTT 32 33  --   --   --   --  32  < > = values in this interval not displayed.  BMP:  Recent Labs  01/26/17 0416 01/27/17 0657 01/29/17 0816 04/06/17 1129  04/30/17 1229 05/07/17 1038 05/14/17 1250 05/21/17 1302  NA 140 141 138 139  < > 138 140 141 139  K 3.2* 4.0 3.7 3.9  < > 3.8 3.7 3.8 3.9  CL 103 108 106 104  --   --   --   --   --   CO2 30 28 27 26   < > 26 27 27 27   GLUCOSE 130* 155* 156* 132*  < > 188* 203* 129 152*  BUN 16 18 10 15   < > 15.0 15.5 16.4 15.8  CALCIUM 8.7* 8.4* 8.5* 9.4  < > 9.1 9.6 9.8 9.5  CREATININE 1.31* 1.30* 1.45* 1.46*  < > 1.6* 1.5* 1.4* 1.4*  GFRNONAA 52* 53* 46* 46*  --   --   --   --   --   GFRAA >60 >60 54* 53*  --   --   --   --   --   < > = values in this interval not displayed.  LIVER FUNCTION TESTS:  Recent Labs  04/30/17 1229  05/07/17 1038 05/14/17 1250 05/21/17 1302  BILITOT <0.22 0.25 0.24 <0.22  AST 12 14 13 12   ALT 10 10 12 13   ALKPHOS 101 93 86 83  PROT 6.9 6.8 6.8 6.5  ALBUMIN 3.7 3.7 3.8 3.7    TUMOR MARKERS: No results for input(s): AFPTM, CEA, CA199, CHROMGRNA in the last 8760 hours.  Assessment and Plan: Stage III A right lung cancer here for port a cath placement.  Port a cath to be placed today.   Risks and benefits discussed with the patient/wife including, but not limited to bleeding, infection, pneumothorax, or fibrin sheath development and need for additional procedures. All of the patient's questions were answered, patient is agreeable to proceed.  Consent signed and in chart. Pt also scheduled for blood transfusion at cancer center post procedure.    Thank you for this interesting consult.  I greatly enjoyed meeting JOHNAVON MCCLAFFERTY and look forward to participating in their care.  A copy of this report was sent to the requesting provider on this date.  Electronically Signed: D. Rowe Robert, PA-C / Quincy Sheehan, PA-S 05/23/2017, 1:25 PM   I spent a total of 15 Minutes in face to face in clinical consultation, greater than 50% of which was counseling/coordinating care for port a cath placement.

## 2017-05-23 NOTE — Discharge Instructions (Signed)
Moderate Conscious Sedation, Adult, Care After °These instructions provide you with information about caring for yourself after your procedure. Your health care provider may also give you more specific instructions. Your treatment has been planned according to current medical practices, but problems sometimes occur. Call your health care provider if you have any problems or questions after your procedure. °What can I expect after the procedure? °After your procedure, it is common: °· To feel sleepy for several hours. °· To feel clumsy and have poor balance for several hours. °· To have poor judgment for several hours. °· To vomit if you eat too soon. ° °Follow these instructions at home: °For at least 24 hours after the procedure: ° °· Do not: °? Participate in activities where you could fall or become injured. °? Drive. °? Use heavy machinery. °? Drink alcohol. °? Take sleeping pills or medicines that cause drowsiness. °? Make important decisions or sign legal documents. °? Take care of children on your own. °· Rest. °Eating and drinking °· Follow the diet recommended by your health care provider. °· If you vomit: °? Drink water, juice, or soup when you can drink without vomiting. °? Make sure you have little or no nausea before eating solid foods. °General instructions °· Have a responsible adult stay with you until you are awake and alert. °· Take over-the-counter and prescription medicines only as told by your health care provider. °· If you smoke, do not smoke without supervision. °· Keep all follow-up visits as told by your health care provider. This is important. °Contact a health care provider if: °· You keep feeling nauseous or you keep vomiting. °· You feel light-headed. °· You develop a rash. °· You have a fever. °Get help right away if: °· You have trouble breathing. °This information is not intended to replace advice given to you by your health care provider. Make sure you discuss any questions you have  with your health care provider. °Document Released: 08/20/2013 Document Revised: 04/03/2016 Document Reviewed: 02/19/2016 °Elsevier Interactive Patient Education © 2018 [employer] ° ° °Implanted Port Home Guide °An implanted port is a type of central line that is placed under the skin. Central lines are used to provide IV access when treatment or nutrition needs to be given through a person’s veins. Implanted ports are used for long-term IV access. An implanted port may be placed because: °· You need IV medicine that would be irritating to the small veins in your hands or arms. °· You need long-term IV medicines, such as antibiotics. °· You need IV nutrition for a long period. °· You need frequent blood draws for lab tests. °· You need dialysis. ° °Implanted ports are usually placed in the chest area, but they can also be placed in the upper arm, the abdomen, or the leg. An implanted port has two main parts: °· Reservoir. The reservoir is round and will appear as a small, raised area under your skin. The reservoir is the part where a needle is inserted to give medicines or draw blood. °· Catheter. The catheter is a thin, flexible tube that extends from the reservoir. The catheter is placed into a large vein. Medicine that is inserted into the reservoir goes into the catheter and then into the vein. ° °How will I care for my incision site? °Do not get the incision site wet. Bathe or shower as directed by your health care provider. °How is my port accessed? °Special steps must be taken to access the   port: °· Before the port is accessed, a numbing cream can be placed on the skin. This helps numb the skin over the port site. °· Your health care provider uses a sterile technique to access the port. °? Your health care provider must put on a mask and sterile gloves. °? The skin over your port is cleaned carefully with an antiseptic and allowed to dry. °? The port is gently pinched between sterile gloves, and a needle  is inserted into the port. °· Only "non-coring" port needles should be used to access the port. Once the port is accessed, a blood return should be checked. This helps ensure that the port is in the vein and is not clogged. °· If your port needs to remain accessed for a constant infusion, a clear (transparent) bandage will be placed over the needle site. The bandage and needle will need to be changed every week, or as directed by your health care provider. °· Keep the bandage covering the needle clean and dry. Do not get it wet. Follow your health care provider’s instructions on how to take a shower or bath while the port is accessed. °· If your port does not need to stay accessed, no bandage is needed over the port. ° °What is flushing? °Flushing helps keep the port from getting clogged. Follow your health care provider’s instructions on how and when to flush the port. Ports are usually flushed with saline solution or a medicine called heparin. The need for flushing will depend on how the port is used. °· If the port is used for intermittent medicines or blood draws, the port will need to be flushed: °? After medicines have been given. °? After blood has been drawn. °? As part of routine maintenance. °· If a constant infusion is running, the port may not need to be flushed. ° °How long will my port stay implanted? °The port can stay in for as long as your health care provider thinks it is needed. When it is time for the port to come out, surgery will be done to remove it. The procedure is similar to the one performed when the port was put in. °When should I seek immediate medical care? °When you have an implanted port, you should seek immediate medical care if: °· You notice a bad smell coming from the incision site. °· You have swelling, redness, or drainage at the incision site. °· You have more swelling or pain at the port site or the surrounding area. °· You have a fever that is not controlled with  medicine. ° °This information is not intended to replace advice given to you by your health care provider. Make sure you discuss any questions you have with your health care provider. °Document Released: 10/30/2005 Document Revised: 04/06/2016 Document Reviewed: 07/07/2013 °Elsevier Interactive Patient Education © 2017 [employer] ° ° °Implanted Port Insertion, Care After °This sheet gives you information about how to care for yourself after your procedure. Your health care provider may also give you more specific instructions. If you have problems or questions, contact your health care provider. °What can I expect after the procedure? °After your procedure, it is common to have: °· Discomfort at the port insertion site. °· Bruising on the skin over the port. This should improve over 3-4 days. ° °Follow these instructions at home: °Port care °· After your port is placed, you will get a manufacturer's information card. The card has information about your port. Keep this card with   you at all times. °· Take care of the port as told by your health care provider. Ask your health care provider if you or a family member can get training for taking care of the port at home. A home health care nurse may also take care of the port. °· Make sure to remember what type of port you have. °Incision care °· Follow instructions from your health care provider about how to take care of your port insertion site. Make sure you: °? Wash your hands with soap and water before you change your bandage (dressing). If soap and water are not available, use hand sanitizer. °? Change your dressing as told by your health care provider. °? Leave stitches (sutures), skin glue, or adhesive strips in place. These skin closures may need to stay in place for 2 weeks or longer. If adhesive strip edges start to loosen and curl up, you may trim the loose edges. Do not remove adhesive strips completely unless your health care provider tells you to do  that. °· Check your port insertion site every day for signs of infection. Check for: °? More redness, swelling, or pain. °? More fluid or blood. °? Warmth. °? Pus or a bad smell. °General instructions °· Do not take baths, swim, or use a hot tub until your health care provider approves. °· Do not lift anything that is heavier than 10 lb (4.5 kg) for a week, or as told by your health care provider. °· Ask your health care provider when it is okay to: °? Return to work or school. °? Resume usual physical activities or sports. °· Do not drive for 24 hours if you were given a medicine to help you relax (sedative). °· Take over-the-counter and prescription medicines only as told by your health care provider. °· Wear a medical alert bracelet in case of an emergency. This will tell any health care providers that you have a port. °· Keep all follow-up visits as told by your health care provider. This is important. °Contact a health care provider if: °· You cannot flush your port with saline as directed, or you cannot draw blood from the port. °· You have a fever or chills. °· You have more redness, swelling, or pain around your port insertion site. °· You have more fluid or blood coming from your port insertion site. °· Your port insertion site feels warm to the touch. °· You have pus or a bad smell coming from the port insertion site. °Get help right away if: °· You have chest pain or shortness of breath. °· You have bleeding from your port that you cannot control. °Summary °· Take care of the port as told by your health care provider. °· Change your dressing as told by your health care provider. °· Keep all follow-up visits as told by your health care provider. °This information is not intended to replace advice given to you by your health care provider. Make sure you discuss any questions you have with your health care provider. °Document Released: 08/20/2013 Document Revised: 09/20/2016 Document Reviewed:  09/20/2016 °Elsevier Interactive Patient Education © 2017 [employer] ° ° °

## 2017-05-24 ENCOUNTER — Ambulatory Visit (HOSPITAL_BASED_OUTPATIENT_CLINIC_OR_DEPARTMENT_OTHER): Payer: Medicare HMO

## 2017-05-24 ENCOUNTER — Ambulatory Visit (HOSPITAL_BASED_OUTPATIENT_CLINIC_OR_DEPARTMENT_OTHER): Payer: Medicare HMO | Admitting: Internal Medicine

## 2017-05-24 ENCOUNTER — Encounter: Payer: Self-pay | Admitting: Internal Medicine

## 2017-05-24 VITALS — BP 111/61 | HR 97 | Temp 97.8°F | Resp 16 | Ht 70.0 in | Wt 231.9 lb

## 2017-05-24 DIAGNOSIS — D649 Anemia, unspecified: Secondary | ICD-10-CM | POA: Diagnosis not present

## 2017-05-24 DIAGNOSIS — D6481 Anemia due to antineoplastic chemotherapy: Secondary | ICD-10-CM

## 2017-05-24 DIAGNOSIS — Z5111 Encounter for antineoplastic chemotherapy: Secondary | ICD-10-CM

## 2017-05-24 DIAGNOSIS — C3411 Malignant neoplasm of upper lobe, right bronchus or lung: Secondary | ICD-10-CM

## 2017-05-24 DIAGNOSIS — R1310 Dysphagia, unspecified: Secondary | ICD-10-CM | POA: Diagnosis not present

## 2017-05-24 DIAGNOSIS — C3491 Malignant neoplasm of unspecified part of right bronchus or lung: Secondary | ICD-10-CM

## 2017-05-24 DIAGNOSIS — T451X5A Adverse effect of antineoplastic and immunosuppressive drugs, initial encounter: Secondary | ICD-10-CM

## 2017-05-24 MED ORDER — ACETAMINOPHEN 325 MG PO TABS
ORAL_TABLET | ORAL | Status: AC
  Filled 2017-05-24: qty 2

## 2017-05-24 MED ORDER — DIPHENHYDRAMINE HCL 25 MG PO CAPS
25.0000 mg | ORAL_CAPSULE | Freq: Once | ORAL | Status: AC
  Administered 2017-05-24: 25 mg via ORAL

## 2017-05-24 MED ORDER — DIPHENHYDRAMINE HCL 25 MG PO CAPS
ORAL_CAPSULE | ORAL | Status: AC
  Filled 2017-05-24: qty 1

## 2017-05-24 MED ORDER — HEPARIN SOD (PORK) LOCK FLUSH 100 UNIT/ML IV SOLN
500.0000 [IU] | Freq: Every day | INTRAVENOUS | Status: AC | PRN
Start: 2017-05-24 — End: 2017-05-24
  Administered 2017-05-24: 500 [IU]
  Filled 2017-05-24: qty 5

## 2017-05-24 MED ORDER — SODIUM CHLORIDE 0.9% FLUSH
10.0000 mL | INTRAVENOUS | Status: AC | PRN
  Administered 2017-05-24: 10 mL
  Filled 2017-05-24: qty 10

## 2017-05-24 MED ORDER — ACETAMINOPHEN 325 MG PO TABS
650.0000 mg | ORAL_TABLET | Freq: Once | ORAL | Status: AC
  Administered 2017-05-24: 650 mg via ORAL

## 2017-05-24 MED ORDER — SODIUM CHLORIDE 0.9 % IV SOLN
250.0000 mL | Freq: Once | INTRAVENOUS | Status: AC
  Administered 2017-05-24: 250 mL via INTRAVENOUS

## 2017-05-24 NOTE — Progress Notes (Signed)
[place] [phone]   [phone]  OFFICE PROGRESS NOTE  Rodney Graves, DO [address] [place] [address]  DIAGNOSIS: Stage IIIA (T1a, N2, M0) non-small cell lung cancer questionable for adenocarcinoma presented with right upper lobe lung nodule in addition to mediastinal lymphadenopathy diagnosed in May 2018.  PRIOR THERAPY: None.  CURRENT THERAPY: Concurrent chemoradiation with weekly carboplatin for AUC of 2 and paclitaxel 45 MG/M2. Status post 4 cycles.  INTERVAL HISTORY: Rodney Orozco 76 y.o. male returns to the clinic today for follow-up visit accompanied by his wife. The patient is feeling fine today was no specific complaints except for fatigue. He has been tolerating his current treatment with concurrent chemoradiation fairly well except for mild odynophagia. He is currently on treatment with Carafate. He denied having any chest pain, shortness of breath, cough or hemoptysis. He denied having any nausea, vomiting, diarrhea or constipation. He is here today for evaluation and recommendation regarding his condition.  MEDICAL HISTORY: Past Medical History:  Diagnosis Date  . AAA (abdominal aortic aneurysm) ([place]) 2017  . Asthma   . Cardiomyopathy ([place])   . Dementia   . Dyslipidemia   . GI bleed   . Goals of care, counseling/discussion 04/20/2017  . HCAP (healthcare-associated pneumonia) 12/2013   01/06/2014  . Hypertension   . Mixed hyperlipidemia   . On home oxygen therapy    2L during the day  . OSA on CPAP    actually BIPAP  . Osteoarthritis   . Paroxysmal atrial fibrillation ([facility])    takes Warfarin  . Pneumonia December 2014   Had hemoptysis and admitted at [facility].  . Pulmonary embolism ([place]) 12/2014   S/P knee replacement/notes 08/31/2016  . TIA (transient ischemic attack)   . Type II diabetes mellitus ([place])   . Vitamin D deficiency     ALLERGIES:  is allergic to codeine; tramadol; warfarin and  related; lipitor [atorvastatin]; and penicillins.  MEDICATIONS:  Current Outpatient Prescriptions  Medication Sig Dispense Refill  . acetaminophen (TYLENOL) 500 MG tablet Take 1,000 mg by mouth 2 (two) times daily as needed for headache.    Marland Kitchen amLODipine (NORVASC) 5 MG tablet Take 1 tablet (5 mg total) by mouth daily. 30 tablet 12  . aspirin EC 81 MG tablet Take 1 tablet (81 mg total) by mouth daily. 90 tablet 3  . clopidogrel (PLAVIX) 75 MG tablet Take 1 tablet (75 mg total) by mouth daily. 30 tablet 12  . Diphenhyd-Hydrocort-Nystatin (FIRST-DUKES MOUTHWASH MT) Take 10 mLs by mouth 4 (four) times daily.    Marland Kitchen donepezil (ARICEPT) 10 MG tablet Take 1 tablet (10 mg total) by mouth at bedtime. 30 tablet 12  . famotidine (PEPCID) 20 MG tablet Take 20 mg by mouth 2 (two) times daily.     Marland Kitchen FeFum-FePoly-FA-B Cmp-C-Biot (INTEGRA PLUS) CAPS Take 1 capsule by mouth every morning. 30 capsule 2  . flintstones complete (FLINTSTONES) 60 MG chewable tablet Chew 1 tablet by mouth 2 (two) times daily.    . furosemide (LASIX) 40 MG tablet Take 1 tablet (40 mg total) by mouth daily. 30 tablet   . losartan (COZAAR) 100 MG tablet Take 100 mg by mouth daily.    . metFORMIN (GLUCOPHAGE) 1000 MG tablet Take 1,000 mg by mouth 2 (two) times daily.     . metoprolol tartrate (LOPRESSOR) 25 MG tablet Take 25 mg by mouth 2 (two) times daily.     Marland Kitchen NOVOLIN  N RELION 100 UNIT/ML injection Inject 0.05 mLs (5 Units total) into the skin 2 (two) times daily. (Patient taking differently: Inject 10 Units into the skin 2 (two) times daily. ) 10 mL 11  . OXYGEN Inhale 2 L into the lungs at bedtime. 2lpm 24/7  DME- [employer]     . VICTOZA 18 MG/3ML SOPN Inject 1.2 mg into the skin every morning.     . nitroGLYCERIN (NITROSTAT) 0.4 MG SL tablet Place 1 tablet (0.4 mg total) under the tongue every 5 (five) minutes as needed for chest pain. MAX 3 dose (Patient not taking: Reported on 04/19/2017) 25 tablet 3  . prochlorperazine (COMPAZINE) 10 MG  tablet Take 1 tablet (10 mg total) by mouth every 6 (six) hours as needed for nausea or vomiting. (Patient not taking: Reported on 05/24/2017) 30 tablet 0   No current facility-administered medications for this visit.     SURGICAL HISTORY:  Past Surgical History:  Procedure Laterality Date  . ABDOMINAL AORTIC ANEURYSM REPAIR  08/31/2016  . ABDOMINAL AORTIC ENDOVASCULAR STENT GRAFT N/A 08/31/2016   Procedure: ABDOMINAL AORTIC ENDOVASCULAR STENT GRAFT and Open Exposure left common femoral artery;  Surgeon: Angelia Mould, MD;  Location: [facility];  Service: Vascular;  Laterality: N/A;  . ABDOMINAL SURGERY  1999   "for aneurysm"  . ABDOMINAL SURGERY     bleeding PUD in setting of aspirin powders. REMOTE. also H.pylori positive per epic notes  . BACK SURGERY    . CARDIAC CATHETERIZATION N/A 12/05/2016   Procedure: Right/Left Heart Cath and Coronary Angiography;  Surgeon: Jettie Booze, MD;  Location: [place] [facility] CV LAB;  Service: Cardiovascular;  Laterality: N/A;  . CARDIAC CATHETERIZATION N/A 12/05/2016   Procedure: Coronary Stent Intervention;  Surgeon: Jettie Booze, MD;  Location: [place] CV LAB;  Service: Cardiovascular;  Laterality: N/A;  . COLONOSCOPY N/A 01/28/2017   Procedure: COLONOSCOPY;  Surgeon: Rogene Houston, MD;  [facility];  Service: Endoscopy;  Laterality: N/A;  . COLONOSCOPY WITH ESOPHAGOGASTRODUODENOSCOPY (EGD)  2004   Dr. Amedeo Plenty: antral gastritis, hypersplastic rectal polyp, few scattered diverticula  . ESOPHAGOGASTRODUODENOSCOPY N/A 01/27/2017   Procedure: ESOPHAGOGASTRODUODENOSCOPY (EGD);  Surgeon: Rogene Houston, MD;  [facility];  Service: Endoscopy;  Laterality: N/A;  . IR FLUORO GUIDE PORT INSERTION LEFT  05/23/2017  . IR US GUIDE VASC ACCESS LEFT  05/23/2017  . JOINT REPLACEMENT    . [name]  . TOTAL KNEE ARTHROPLASTY Bilateral Feb 2016 - Dec 2016  . VIDEO BRONCHOSCOPY WITH ENDOBRONCHIAL ULTRASOUND N/A  04/11/2017   Procedure: VIDEO BRONCHOSCOPY WITH ENDOBRONCHIAL ULTRASOUND;  Surgeon: Prescott Gum, Collier Salina, MD;  Location: [facility];  Service: Thoracic;  Laterality: N/A;    REVIEW OF SYSTEMS:  Constitutional: positive for fatigue Eyes: negative Ears, nose, mouth, throat, and face: negative Respiratory: negative Cardiovascular: negative Gastrointestinal: positive for odynophagia Genitourinary:negative Integument/breast: negative Hematologic/lymphatic: negative Musculoskeletal:negative Neurological: negative Behavioral/Psych: negative Endocrine: negative Allergic/Immunologic: negative   PHYSICAL EXAMINATION: General appearance: alert, cooperative, fatigued and no distress Head: Normocephalic, without obvious abnormality, atraumatic Neck: no adenopathy, no JVD, supple, symmetrical, trachea midline and thyroid not enlarged, symmetric, no tenderness/mass/nodules Lymph nodes: Cervical, supraclavicular, and axillary nodes normal. Resp: clear to auscultation bilaterally Back: symmetric, no curvature. ROM normal. No CVA tenderness. Cardio: regular rate and rhythm, S1, S2 normal, no murmur, click, rub or gallop GI: soft, non-tender; bowel sounds normal; no masses,  no organomegaly Extremities: extremities normal, atraumatic, no cyanosis or edema Neurologic: Alert  and oriented X 3, normal strength and tone. Normal symmetric reflexes. Normal coordination and gait  ECOG PERFORMANCE STATUS: 1 - Symptomatic but completely ambulatory  Blood pressure 111/61, pulse 97, temperature 97.8 F (36.6 C), temperature source Oral, resp. rate 16, height 5\' 10"  (1.778 m), weight 231 lb 14.4 oz (105.2 kg), SpO2 95 %.  LABORATORY DATA: Lab Results  Component Value Date   WBC 6.3 05/23/2017   HGB 7.7 (L) 05/23/2017   HCT 24.7 (L) 05/23/2017   MCV 88.2 05/23/2017   PLT 253 05/23/2017      Chemistry      Component Value Date/Time   NA 139 05/21/2017 1302   K 3.9 05/21/2017 1302   CL 104 04/06/2017 1129    CO2 27 05/21/2017 1302   BUN 15.8 05/21/2017 1302   CREATININE 1.4 (H) 05/21/2017 1302      Component Value Date/Time   CALCIUM 9.5 05/21/2017 1302   ALKPHOS 83 05/21/2017 1302   AST 12 05/21/2017 1302   ALT 13 05/21/2017 1302   BILITOT <0.22 05/21/2017 1302       RADIOGRAPHIC STUDIES: Ir US Guide Vasc Access Left  Result Date: 05/24/2017 CLINICAL DATA:  Right lung adenocarcinoma, stage IIIA. Port-A-Cath needed for chemotherapy. EXAM: IMPLANTED PORT A CATH PLACEMENT WITH ULTRASOUND AND FLUOROSCOPIC GUIDANCE ANESTHESIA/SEDATION: 2.0 mg IV Versed; 100 mcg IV Fentanyl Total Moderate Sedation Time:  46 minutes The patient's level of consciousness and physiologic status were continuously monitored during the procedure by Radiology nursing. Additional Medications: 1 g IV vancomycin. Vancomycin was given within two hours of incision. Vancomycin was given due to an antibiotic allergy. FLUOROSCOPY TIME:  1 minutes and 54 seconds.  62.4 mGy. PROCEDURE: The procedure, risks, benefits, and alternatives were explained to the patient. Questions regarding the procedure were encouraged and answered. The patient understands and consents to the procedure. A time-out was performed prior to initiating the procedure. Ultrasound was utilized to confirm patency of the left internal jugular vein. The left neck and chest were prepped with chlorhexidine in a sterile fashion, and a sterile drape was applied covering the operative field. Maximum barrier sterile technique with sterile gowns and gloves were used for the procedure. Local anesthesia was provided with 1% lidocaine. After creating a small venotomy incision, a 21 gauge needle was advanced into the left internal jugular vein under direct, real-time ultrasound guidance. Ultrasound image documentation was performed. After securing guidewire access, an 8 Fr dilator was placed. A J-wire was kinked to measure appropriate catheter length. A subcutaneous port pocket was then  created along the upper chest wall utilizing sharp and blunt dissection. Portable cautery was utilized. The pocket was irrigated with sterile saline. A single lumen power injectable port was chosen for placement. The 8 Fr catheter was tunneled from the port pocket site to the venotomy incision. The port was placed in the pocket. External catheter was trimmed to appropriate length based on guidewire measurement. At the venotomy, an 8 Fr peel-away sheath was placed over a guidewire. The catheter was then placed through the sheath and the sheath removed. Final catheter positioning was confirmed and documented with a fluoroscopic spot image. The port was accessed with a needle and aspirated and flushed with heparinized saline. The access needle was removed. The venotomy and port pocket incisions were closed with subcutaneous 3-0 Monocryl and subcuticular 4-0 Vicryl. Dermabond was applied to both incisions. COMPLICATIONS: COMPLICATIONS None FINDINGS: After catheter placement, the tip lies at the cavo-atrial junction. The catheter aspirates normally and is  ready for immediate use. IMPRESSION: Placement of single lumen port a cath via left internal jugular vein. The catheter tip lies at the cavo-atrial junction. A power injectable port a cath was placed and is ready for immediate use. Electronically Signed   By: Aletta Edouard M.D.   On: 05/24/2017 12:53   Ir Fluoro Guide Port Insertion Left  Result Date: 05/24/2017 CLINICAL DATA:  Right lung adenocarcinoma, stage IIIA. Port-A-Cath needed for chemotherapy. EXAM: IMPLANTED PORT A CATH PLACEMENT WITH ULTRASOUND AND FLUOROSCOPIC GUIDANCE ANESTHESIA/SEDATION: 2.0 mg IV Versed; 100 mcg IV Fentanyl Total Moderate Sedation Time:  46 minutes The patient's level of consciousness and physiologic status were continuously monitored during the procedure by Radiology nursing. Additional Medications: 1 g IV vancomycin. Vancomycin was given within two hours of incision. Vancomycin  was given due to an antibiotic allergy. FLUOROSCOPY TIME:  1 minutes and 54 seconds.  62.4 mGy. PROCEDURE: The procedure, risks, benefits, and alternatives were explained to the patient. Questions regarding the procedure were encouraged and answered. The patient understands and consents to the procedure. A time-out was performed prior to initiating the procedure. Ultrasound was utilized to confirm patency of the left internal jugular vein. The left neck and chest were prepped with chlorhexidine in a sterile fashion, and a sterile drape was applied covering the operative field. Maximum barrier sterile technique with sterile gowns and gloves were used for the procedure. Local anesthesia was provided with 1% lidocaine. After creating a small venotomy incision, a 21 gauge needle was advanced into the left internal jugular vein under direct, real-time ultrasound guidance. Ultrasound image documentation was performed. After securing guidewire access, an 8 Fr dilator was placed. A J-wire was kinked to measure appropriate catheter length. A subcutaneous port pocket was then created along the upper chest wall utilizing sharp and blunt dissection. Portable cautery was utilized. The pocket was irrigated with sterile saline. A single lumen power injectable port was chosen for placement. The 8 Fr catheter was tunneled from the port pocket site to the venotomy incision. The port was placed in the pocket. External catheter was trimmed to appropriate length based on guidewire measurement. At the venotomy, an 8 Fr peel-away sheath was placed over a guidewire. The catheter was then placed through the sheath and the sheath removed. Final catheter positioning was confirmed and documented with a fluoroscopic spot image. The port was accessed with a needle and aspirated and flushed with heparinized saline. The access needle was removed. The venotomy and port pocket incisions were closed with subcutaneous 3-0 Monocryl and subcuticular 4-0  Vicryl. Dermabond was applied to both incisions. COMPLICATIONS: COMPLICATIONS None FINDINGS: After catheter placement, the tip lies at the cavo-atrial junction. The catheter aspirates normally and is ready for immediate use. IMPRESSION: Placement of single lumen port a cath via left internal jugular vein. The catheter tip lies at the cavo-atrial junction. A power injectable port a cath was placed and is ready for immediate use. Electronically Signed   By: Aletta Edouard M.D.   On: 05/24/2017 12:53    ASSESSMENT AND PLAN: This is a very pleasant 76 years old white male with a stage IIIa non-small cell lung cancer. The patient is currently undergoing a course of concurrent chemoradiation with weekly carboplatin and paclitaxel is status post 4 cycles and has been tolerating the treatment well except for odynophagia. I recommended for the patient to continue his treatment as scheduled. He is currently receiving his radiotherapy in [facility]. For odynophagia, I advised the patient to  continue on Carafate and he may need pain medication with hydrocodone if needed. For the chemotherapy-induced anemia, we will arrange for the patient to receive 2 units of PRBCs transfusion today. I will see him back for follow-up visit in 2 weeks for reevaluation and management of any adverse effect of his treatment. The patient was advised to call immediately if he has any concerning symptoms in the interval. The patient voices understanding of current disease status and treatment options and is in agreement with the current care plan. All questions were answered. The patient knows to call the clinic with any problems, questions or concerns. We can certainly see the patient much sooner if necessary.  I spent 15 minutes counseling the patient face to face. The total time spent in the appointment was 25 minutes.  Disclaimer: This note was dictated with voice recognition software. Similar sounding words can  inadvertently be transcribed and may not be corrected upon review.

## 2017-05-24 NOTE — Patient Instructions (Addendum)

## 2017-05-25 LAB — TYPE AND SCREEN
ABO/RH(D): A NEG
ANTIBODY SCREEN: NEGATIVE
UNIT DIVISION: 0
Unit division: 0

## 2017-05-25 LAB — BPAM RBC
BLOOD PRODUCT EXPIRATION DATE: 201807252359
Blood Product Expiration Date: 201807252359
ISSUE DATE / TIME: 201807121330
ISSUE DATE / TIME: 201807121330
UNIT TYPE AND RH: 600
Unit Type and Rh: 600

## 2017-05-28 ENCOUNTER — Other Ambulatory Visit (HOSPITAL_BASED_OUTPATIENT_CLINIC_OR_DEPARTMENT_OTHER): Payer: Medicare HMO

## 2017-05-28 ENCOUNTER — Ambulatory Visit (HOSPITAL_BASED_OUTPATIENT_CLINIC_OR_DEPARTMENT_OTHER): Payer: Medicare HMO

## 2017-05-28 DIAGNOSIS — C3491 Malignant neoplasm of unspecified part of right bronchus or lung: Secondary | ICD-10-CM

## 2017-05-28 DIAGNOSIS — Z5111 Encounter for antineoplastic chemotherapy: Secondary | ICD-10-CM

## 2017-05-28 DIAGNOSIS — C3411 Malignant neoplasm of upper lobe, right bronchus or lung: Secondary | ICD-10-CM | POA: Diagnosis not present

## 2017-05-28 LAB — COMPREHENSIVE METABOLIC PANEL
ALT: 14 U/L (ref 0–55)
AST: 15 U/L (ref 5–34)
Albumin: 3.8 g/dL (ref 3.5–5.0)
Alkaline Phosphatase: 87 U/L (ref 40–150)
Anion Gap: 8 mEq/L (ref 3–11)
BILIRUBIN TOTAL: 0.26 mg/dL (ref 0.20–1.20)
BUN: 13.2 mg/dL (ref 7.0–26.0)
CHLORIDE: 104 meq/L (ref 98–109)
CO2: 25 meq/L (ref 22–29)
Calcium: 9.4 mg/dL (ref 8.4–10.4)
Creatinine: 1.4 mg/dL — ABNORMAL HIGH (ref 0.7–1.3)
EGFR: 49 mL/min/{1.73_m2} — AB (ref >90)
GLUCOSE: 184 mg/dL — AB (ref 70–140)
Potassium: 3.7 mEq/L (ref 3.5–5.1)
SODIUM: 138 meq/L (ref 136–145)
TOTAL PROTEIN: 6.7 g/dL (ref 6.4–8.3)

## 2017-05-28 LAB — CBC WITH DIFFERENTIAL/PLATELET
BASO%: 0.6 % (ref 0.0–2.0)
Basophils Absolute: 0 10*3/uL (ref 0.0–0.1)
EOS ABS: 0.2 10*3/uL (ref 0.0–0.5)
EOS%: 3.2 % (ref 0.0–7.0)
HCT: 31.8 % — ABNORMAL LOW (ref 38.4–49.9)
HGB: 10 g/dL — ABNORMAL LOW (ref 13.0–17.1)
LYMPH%: 6.1 % — AB (ref 14.0–49.0)
MCH: 28.5 pg (ref 27.2–33.4)
MCHC: 31.4 g/dL — AB (ref 32.0–36.0)
MCV: 90.6 fL (ref 79.3–98.0)
MONO#: 0.5 10*3/uL (ref 0.1–0.9)
MONO%: 7.9 % (ref 0.0–14.0)
NEUT%: 82.2 % — AB (ref 39.0–75.0)
NEUTROS ABS: 5.1 10*3/uL (ref 1.5–6.5)
Platelets: 168 10*3/uL (ref 140–400)
RBC: 3.51 10*6/uL — AB (ref 4.20–5.82)
RDW: 22.6 % — ABNORMAL HIGH (ref 11.0–14.6)
WBC: 6.2 10*3/uL (ref 4.0–10.3)
lymph#: 0.4 10*3/uL — ABNORMAL LOW (ref 0.9–3.3)

## 2017-05-28 MED ORDER — PALONOSETRON HCL INJECTION 0.25 MG/5ML
INTRAVENOUS | Status: AC
  Filled 2017-05-28: qty 5

## 2017-05-28 MED ORDER — SODIUM CHLORIDE 0.9 % IV SOLN
Freq: Once | INTRAVENOUS | Status: AC
  Administered 2017-05-28: 14:00:00 via INTRAVENOUS

## 2017-05-28 MED ORDER — PALONOSETRON HCL INJECTION 0.25 MG/5ML
0.2500 mg | Freq: Once | INTRAVENOUS | Status: AC
  Administered 2017-05-28: 0.25 mg via INTRAVENOUS

## 2017-05-28 MED ORDER — SODIUM CHLORIDE 0.9 % IV SOLN
162.8000 mg | Freq: Once | INTRAVENOUS | Status: AC
  Administered 2017-05-28: 160 mg via INTRAVENOUS
  Filled 2017-05-28: qty 16

## 2017-05-28 MED ORDER — FAMOTIDINE IN NACL 20-0.9 MG/50ML-% IV SOLN
20.0000 mg | Freq: Once | INTRAVENOUS | Status: AC
  Administered 2017-05-28: 20 mg via INTRAVENOUS

## 2017-05-28 MED ORDER — HEPARIN SOD (PORK) LOCK FLUSH 100 UNIT/ML IV SOLN
500.0000 [IU] | Freq: Once | INTRAVENOUS | Status: AC | PRN
  Administered 2017-05-28: 500 [IU]
  Filled 2017-05-28: qty 5

## 2017-05-28 MED ORDER — SODIUM CHLORIDE 0.9 % IV SOLN
50.0000 mg | Freq: Once | INTRAVENOUS | Status: AC
  Administered 2017-05-28: 50 mg via INTRAVENOUS
  Filled 2017-05-28: qty 1

## 2017-05-28 MED ORDER — SODIUM CHLORIDE 0.9 % IV SOLN
20.0000 mg | Freq: Once | INTRAVENOUS | Status: AC
  Administered 2017-05-28: 20 mg via INTRAVENOUS
  Filled 2017-05-28: qty 2

## 2017-05-28 MED ORDER — FAMOTIDINE IN NACL 20-0.9 MG/50ML-% IV SOLN
INTRAVENOUS | Status: AC
  Filled 2017-05-28: qty 50

## 2017-05-28 MED ORDER — SODIUM CHLORIDE 0.9 % IV SOLN
45.0000 mg/m2 | Freq: Once | INTRAVENOUS | Status: AC
  Administered 2017-05-28: 102 mg via INTRAVENOUS
  Filled 2017-05-28: qty 17

## 2017-05-28 MED ORDER — SODIUM CHLORIDE 0.9% FLUSH
10.0000 mL | INTRAVENOUS | Status: DC | PRN
  Administered 2017-05-28: 10 mL
  Filled 2017-05-28: qty 10

## 2017-05-28 NOTE — Patient Instructions (Signed)
[facility] Discharge Instructions for Patients Receiving Chemotherapy  Today you received the following chemotherapy agents :  Taxol,  Carboplatin.  To help prevent nausea and vomiting after your treatment, we encourage you to take your nausea medication as prescribed.   If you develop nausea and vomiting that is not controlled by your nausea medication, call the clinic.   BELOW ARE SYMPTOMS THAT SHOULD BE REPORTED IMMEDIATELY:  *FEVER GREATER THAN 100.5 F  *CHILLS WITH OR WITHOUT FEVER  NAUSEA AND VOMITING THAT IS NOT CONTROLLED WITH YOUR NAUSEA MEDICATION  *UNUSUAL SHORTNESS OF BREATH  *UNUSUAL BRUISING OR BLEEDING  TENDERNESS IN MOUTH AND THROAT WITH OR WITHOUT PRESENCE OF ULCERS  *URINARY PROBLEMS  *BOWEL PROBLEMS  UNUSUAL RASH Items with * indicate a potential emergency and should be followed up as soon as possible.  Feel free to call the clinic you have any questions or concerns. The clinic phone number is [phone].  Please show the CHEMO ALERT CARD at check-in to the Emergency Department and triage nurse.   

## 2017-06-04 ENCOUNTER — Ambulatory Visit (HOSPITAL_BASED_OUTPATIENT_CLINIC_OR_DEPARTMENT_OTHER): Payer: Medicare HMO | Admitting: Internal Medicine

## 2017-06-04 ENCOUNTER — Telehealth: Payer: Self-pay | Admitting: Internal Medicine

## 2017-06-04 ENCOUNTER — Encounter: Payer: Self-pay | Admitting: Internal Medicine

## 2017-06-04 ENCOUNTER — Ambulatory Visit (HOSPITAL_BASED_OUTPATIENT_CLINIC_OR_DEPARTMENT_OTHER): Payer: Medicare HMO

## 2017-06-04 ENCOUNTER — Other Ambulatory Visit (HOSPITAL_BASED_OUTPATIENT_CLINIC_OR_DEPARTMENT_OTHER): Payer: Medicare HMO

## 2017-06-04 VITALS — BP 133/75 | HR 85 | Temp 98.0°F | Resp 18 | Ht 70.0 in | Wt 228.8 lb

## 2017-06-04 DIAGNOSIS — C3491 Malignant neoplasm of unspecified part of right bronchus or lung: Secondary | ICD-10-CM

## 2017-06-04 DIAGNOSIS — C3411 Malignant neoplasm of upper lobe, right bronchus or lung: Secondary | ICD-10-CM

## 2017-06-04 DIAGNOSIS — T451X5A Adverse effect of antineoplastic and immunosuppressive drugs, initial encounter: Secondary | ICD-10-CM

## 2017-06-04 DIAGNOSIS — D6481 Anemia due to antineoplastic chemotherapy: Secondary | ICD-10-CM

## 2017-06-04 DIAGNOSIS — Z5111 Encounter for antineoplastic chemotherapy: Secondary | ICD-10-CM

## 2017-06-04 LAB — COMPREHENSIVE METABOLIC PANEL
ALBUMIN: 3.9 g/dL (ref 3.5–5.0)
ALK PHOS: 84 U/L (ref 40–150)
ALT: 12 U/L (ref 0–55)
AST: 16 U/L (ref 5–34)
Anion Gap: 12 mEq/L — ABNORMAL HIGH (ref 3–11)
BILIRUBIN TOTAL: 0.26 mg/dL (ref 0.20–1.20)
BUN: 18 mg/dL (ref 7.0–26.0)
CO2: 21 mEq/L — ABNORMAL LOW (ref 22–29)
Calcium: 9.2 mg/dL (ref 8.4–10.4)
Chloride: 107 mEq/L (ref 98–109)
Creatinine: 1.4 mg/dL — ABNORMAL HIGH (ref 0.7–1.3)
EGFR: 49 mL/min/{1.73_m2} — ABNORMAL LOW (ref >90)
GLUCOSE: 173 mg/dL — AB (ref 70–140)
Potassium: 3.8 mEq/L (ref 3.5–5.1)
SODIUM: 140 meq/L (ref 136–145)
TOTAL PROTEIN: 7.1 g/dL (ref 6.4–8.3)

## 2017-06-04 LAB — CBC WITH DIFFERENTIAL/PLATELET
BASO%: 0.5 % (ref 0.0–2.0)
Basophils Absolute: 0 10*3/uL (ref 0.0–0.1)
EOS ABS: 0.2 10*3/uL (ref 0.0–0.5)
EOS%: 4.9 % (ref 0.0–7.0)
HCT: 33.3 % — ABNORMAL LOW (ref 38.4–49.9)
HEMOGLOBIN: 10.8 g/dL — AB (ref 13.0–17.1)
LYMPH%: 6.8 % — ABNORMAL LOW (ref 14.0–49.0)
MCH: 29.3 pg (ref 27.2–33.4)
MCHC: 32.5 g/dL (ref 32.0–36.0)
MCV: 90 fL (ref 79.3–98.0)
MONO#: 0.3 10*3/uL (ref 0.1–0.9)
MONO%: 6.1 % (ref 0.0–14.0)
NEUT#: 3.8 10*3/uL (ref 1.5–6.5)
NEUT%: 81.7 % — ABNORMAL HIGH (ref 39.0–75.0)
Platelets: 149 10*3/uL (ref 140–400)
RBC: 3.7 10*6/uL — ABNORMAL LOW (ref 4.20–5.82)
RDW: 24.9 % — AB (ref 11.0–14.6)
WBC: 4.7 10*3/uL (ref 4.0–10.3)
lymph#: 0.3 10*3/uL — ABNORMAL LOW (ref 0.9–3.3)

## 2017-06-04 MED ORDER — SODIUM CHLORIDE 0.9 % IV SOLN
50.0000 mg | Freq: Once | INTRAVENOUS | Status: AC
  Administered 2017-06-04: 50 mg via INTRAVENOUS
  Filled 2017-06-04: qty 1

## 2017-06-04 MED ORDER — SODIUM CHLORIDE 0.9 % IV SOLN
20.0000 mg | Freq: Once | INTRAVENOUS | Status: AC
  Administered 2017-06-04: 20 mg via INTRAVENOUS
  Filled 2017-06-04: qty 2

## 2017-06-04 MED ORDER — SODIUM CHLORIDE 0.9 % IV SOLN
45.0000 mg/m2 | Freq: Once | INTRAVENOUS | Status: AC
  Administered 2017-06-04: 102 mg via INTRAVENOUS
  Filled 2017-06-04: qty 17

## 2017-06-04 MED ORDER — FAMOTIDINE IN NACL 20-0.9 MG/50ML-% IV SOLN
20.0000 mg | Freq: Once | INTRAVENOUS | Status: AC
Start: 2017-06-04 — End: 2017-06-04
  Administered 2017-06-04: 20 mg via INTRAVENOUS

## 2017-06-04 MED ORDER — SODIUM CHLORIDE 0.9 % IV SOLN
Freq: Once | INTRAVENOUS | Status: AC
  Administered 2017-06-04: 14:00:00 via INTRAVENOUS

## 2017-06-04 MED ORDER — CARBOPLATIN CHEMO INJECTION 450 MG/45ML
162.8000 mg | Freq: Once | INTRAVENOUS | Status: AC
  Administered 2017-06-04: 160 mg via INTRAVENOUS
  Filled 2017-06-04: qty 16

## 2017-06-04 MED ORDER — HEPARIN SOD (PORK) LOCK FLUSH 100 UNIT/ML IV SOLN
500.0000 [IU] | Freq: Once | INTRAVENOUS | Status: AC | PRN
  Administered 2017-06-04: 500 [IU]
  Filled 2017-06-04: qty 5

## 2017-06-04 MED ORDER — PALONOSETRON HCL INJECTION 0.25 MG/5ML
INTRAVENOUS | Status: AC
  Filled 2017-06-04: qty 5

## 2017-06-04 MED ORDER — FAMOTIDINE IN NACL 20-0.9 MG/50ML-% IV SOLN
INTRAVENOUS | Status: AC
  Filled 2017-06-04: qty 50

## 2017-06-04 MED ORDER — DIPHENHYDRAMINE HCL 50 MG/ML IJ SOLN
INTRAMUSCULAR | Status: AC
  Filled 2017-06-04: qty 1

## 2017-06-04 MED ORDER — PALONOSETRON HCL INJECTION 0.25 MG/5ML
0.2500 mg | Freq: Once | INTRAVENOUS | Status: AC
  Administered 2017-06-04: 0.25 mg via INTRAVENOUS

## 2017-06-04 MED ORDER — SODIUM CHLORIDE 0.9% FLUSH
10.0000 mL | INTRAVENOUS | Status: DC | PRN
  Administered 2017-06-04: 10 mL
  Filled 2017-06-04: qty 10

## 2017-06-04 NOTE — Patient Instructions (Signed)
[facility] Discharge Instructions for Patients Receiving Chemotherapy  Today you received the following chemotherapy agents taxol/carboplatin   To help prevent nausea and vomiting after your treatment, we encourage you to take your nausea medication as directed  If you develop nausea and vomiting that is not controlled by your nausea medication, call the clinic.   BELOW ARE SYMPTOMS THAT SHOULD BE REPORTED IMMEDIATELY:  *FEVER GREATER THAN 100.5 F  *CHILLS WITH OR WITHOUT FEVER  NAUSEA AND VOMITING THAT IS NOT CONTROLLED WITH YOUR NAUSEA MEDICATION  *UNUSUAL SHORTNESS OF BREATH  *UNUSUAL BRUISING OR BLEEDING  TENDERNESS IN MOUTH AND THROAT WITH OR WITHOUT PRESENCE OF ULCERS  *URINARY PROBLEMS  *BOWEL PROBLEMS  UNUSUAL RASH Items with * indicate a potential emergency and should be followed up as soon as possible.  Feel free to call the clinic you have any questions or concerns. The clinic phone number is [phone].  Please show the [place] at check-in to the Emergency Department and triage nurse.

## 2017-06-04 NOTE — Progress Notes (Signed)
[place] [phone]   [phone]  OFFICE PROGRESS NOTE  Rodney Graves, DO [address] [place] [address]  DIAGNOSIS: Stage IIIA (T1a, N2, M0) non-small cell lung cancer questionable for adenocarcinoma presented with right upper lobe lung nodule in addition to mediastinal lymphadenopathy diagnosed in May 2018.  PRIOR THERAPY: None.  CURRENT THERAPY: Concurrent chemoradiation with weekly carboplatin for AUC of 2 and paclitaxel 45 MG/M2. Status post 5 cycles.  INTERVAL HISTORY: Rodney Orozco 76 y.o. male returns to the clinic today for follow-up visit accompanied by his wife. The patient is feeling fine today with no specific complaints. He continues to tolerated the course of concurrent chemoradiation fairly well. He denied having any significant dysphagia or odynophagia but he has mild epigastric pain earlier today and he was getting pain medication by radiation oncology. He is expected to complete the course of concurrent radiotherapy on 06/19/2017. He was receiving his radiation in [facility]. He denied having any significant weight loss or night sweats. He has no nausea, vomiting, diarrhea or constipation. He denied having any significant chest pain, shortness of breath, cough or hemoptysis. He is here today for evaluation before starting cycle #6 of his treatment.  MEDICAL HISTORY: Past Medical History:  Diagnosis Date  . AAA (abdominal aortic aneurysm) ([place]) 2017  . Asthma   . Cardiomyopathy ([place])   . Dementia   . Dyslipidemia   . GI bleed   . Goals of care, counseling/discussion 04/20/2017  . HCAP (healthcare-associated pneumonia) 12/2013   01/06/2014  . Hypertension   . Mixed hyperlipidemia   . On home oxygen therapy    2L during the day  . OSA on CPAP    actually BIPAP  . Osteoarthritis   . Paroxysmal atrial fibrillation ([facility])    takes Warfarin  . Pneumonia December 2014   Had hemoptysis and admitted at [facility].  . Pulmonary embolism ([place]) 12/2014   S/P knee replacement/notes 08/31/2016  . TIA (transient ischemic attack)   . Type II diabetes mellitus ([place])   . Vitamin D deficiency     ALLERGIES:  is allergic to codeine; tramadol; warfarin and related; lipitor [atorvastatin]; and penicillins.  MEDICATIONS:  Current Outpatient Prescriptions  Medication Sig Dispense Refill  . acetaminophen (TYLENOL) 500 MG tablet Take 1,000 mg by mouth 2 (two) times daily as needed for headache.    Marland Kitchen amLODipine (NORVASC) 5 MG tablet Take 1 tablet (5 mg total) by mouth daily. 30 tablet 12  . aspirin EC 81 MG tablet Take 1 tablet (81 mg total) by mouth daily. 90 tablet 3  . clopidogrel (PLAVIX) 75 MG tablet Take 1 tablet (75 mg total) by mouth daily. 30 tablet 12  . Diphenhyd-Hydrocort-Nystatin (FIRST-DUKES MOUTHWASH MT) Take 10 mLs by mouth 4 (four) times daily.    Marland Kitchen donepezil (ARICEPT) 10 MG tablet Take 1 tablet (10 mg total) by mouth at bedtime. 30 tablet 12  . famotidine (PEPCID) 20 MG tablet Take 20 mg by mouth 2 (two) times daily.     Marland Kitchen FeFum-FePoly-FA-B Cmp-C-Biot (INTEGRA PLUS) CAPS Take 1 capsule by mouth every morning. 30 capsule 2  . flintstones complete (FLINTSTONES) 60 MG chewable tablet Chew 1 tablet by mouth 2 (two) times daily.    . furosemide (LASIX) 40 MG tablet Take 1 tablet (40 mg total) by mouth daily. 30 tablet   . losartan (COZAAR) 100 MG tablet Take 100 mg by mouth daily.    Marland Kitchen  metFORMIN (GLUCOPHAGE) 1000 MG tablet Take 1,000 mg by mouth 2 (two) times daily.     . metoprolol tartrate (LOPRESSOR) 25 MG tablet Take 25 mg by mouth 2 (two) times daily.     . nitroGLYCERIN (NITROSTAT) 0.4 MG SL tablet Place 1 tablet (0.4 mg total) under the tongue every 5 (five) minutes as needed for chest pain. MAX 3 dose (Patient not taking: Reported on 04/19/2017) 25 tablet 3  . NOVOLIN N RELION 100 UNIT/ML injection Inject 0.05 mLs (5 Units total) into the skin 2 (two) times daily. (Patient taking  differently: Inject 10 Units into the skin 2 (two) times daily. ) 10 mL 11  . OXYGEN Inhale 2 L into the lungs at bedtime. 2lpm 24/7  DME- [employer]     . prochlorperazine (COMPAZINE) 10 MG tablet Take 1 tablet (10 mg total) by mouth every 6 (six) hours as needed for nausea or vomiting. (Patient not taking: Reported on 05/24/2017) 30 tablet 0  . VICTOZA 18 MG/3ML SOPN Inject 1.2 mg into the skin every morning.      No current facility-administered medications for this visit.     SURGICAL HISTORY:  Past Surgical History:  Procedure Laterality Date  . ABDOMINAL AORTIC ANEURYSM REPAIR  08/31/2016  . ABDOMINAL AORTIC ENDOVASCULAR STENT GRAFT N/A 08/31/2016   Procedure: ABDOMINAL AORTIC ENDOVASCULAR STENT GRAFT and Open Exposure left common femoral artery;  Surgeon: Angelia Mould, MD;  Location: [facility] OR;  Service: Vascular;  Laterality: N/A;  . ABDOMINAL SURGERY  1999   "for aneurysm"  . ABDOMINAL SURGERY     bleeding PUD in setting of aspirin powders. REMOTE. also H.pylori positive per epic notes  . BACK SURGERY    . CARDIAC CATHETERIZATION N/A 12/05/2016   Procedure: Right/Left Heart Cath and Coronary Angiography;  Surgeon: Jettie Booze, MD;  Location: [facility];  Service: Cardiovascular;  Laterality: N/A;  . CARDIAC CATHETERIZATION N/A 12/05/2016   Procedure: Coronary Stent Intervention;  Surgeon: Jettie Booze, MD;  Location: [facility];  Service: Cardiovascular;  Laterality: N/A;  . COLONOSCOPY N/A 01/28/2017   Procedure: COLONOSCOPY;  Surgeon: Rogene Houston, MD;  [facility];  Service: Endoscopy;  Laterality: N/A;  . COLONOSCOPY WITH ESOPHAGOGASTRODUODENOSCOPY (EGD)  2004   Dr. Amedeo Plenty: antral gastritis, hypersplastic rectal polyp, few scattered diverticula  . ESOPHAGOGASTRODUODENOSCOPY N/A 01/27/2017   Procedure: ESOPHAGOGASTRODUODENOSCOPY (EGD);  Surgeon: Rogene Houston, MD;  [facility];  Service: Endoscopy;  Laterality: N/A;  . IR  FLUORO GUIDE PORT INSERTION LEFT  05/23/2017  . IR US GUIDE VASC ACCESS LEFT  05/23/2017  . JOINT REPLACEMENT    . [place]  . TOTAL KNEE ARTHROPLASTY Bilateral Feb 2016 - Dec 2016  . VIDEO BRONCHOSCOPY WITH ENDOBRONCHIAL ULTRASOUND N/A 04/11/2017   Procedure: VIDEO BRONCHOSCOPY WITH ENDOBRONCHIAL ULTRASOUND;  Surgeon: Prescott Gum, Collier [place], MD;  Location: [place];  Service: Thoracic;  Laterality: N/A;    REVIEW OF SYSTEMS:  A comprehensive review of systems was negative except for: Constitutional: positive for fatigue Gastrointestinal: positive for abdominal pain   PHYSICAL EXAMINATION: General appearance: alert, cooperative, fatigued and no distress Head: Normocephalic, without obvious abnormality, atraumatic Neck: no adenopathy, no JVD, supple, symmetrical, trachea midline and thyroid not enlarged, symmetric, no tenderness/mass/nodules Lymph nodes: Cervical, supraclavicular, and axillary nodes normal. Resp: clear to auscultation bilaterally Back: symmetric, no curvature. ROM normal. No CVA tenderness. Cardio: regular rate and rhythm, S1, S2 normal, no murmur, click, rub  or gallop GI: soft, non-tender; bowel sounds normal; no masses,  no organomegaly Extremities: extremities normal, atraumatic, no cyanosis or edema  ECOG PERFORMANCE STATUS: 1 - Symptomatic but completely ambulatory  Blood pressure 133/75, pulse 85, temperature 98 F (36.7 C), temperature source Oral, resp. rate 18, height 5\' 10"  (1.778 m), weight 228 lb 12.8 oz (103.8 kg), SpO2 97 %.  LABORATORY DATA: Lab Results  Component Value Date   WBC 4.7 06/04/2017   HGB 10.8 (L) 06/04/2017   HCT 33.3 (L) 06/04/2017   MCV 90.0 06/04/2017   PLT 149 06/04/2017      Chemistry      Component Value Date/Time   NA 138 05/28/2017 1307   K 3.7 05/28/2017 1307   CL 104 04/06/2017 1129   CO2 25 05/28/2017 1307   BUN 13.2 05/28/2017 1307   CREATININE 1.4 (H) 05/28/2017 1307      Component Value Date/Time   CALCIUM  9.4 05/28/2017 1307   ALKPHOS 87 05/28/2017 1307   AST 15 05/28/2017 1307   ALT 14 05/28/2017 1307   BILITOT 0.26 05/28/2017 1307       RADIOGRAPHIC STUDIES: Ir US Guide Vasc Access Left  Result Date: 05/24/2017 CLINICAL DATA:  Right lung adenocarcinoma, stage IIIA. Port-A-Cath needed for chemotherapy. EXAM: IMPLANTED PORT A CATH PLACEMENT WITH ULTRASOUND AND FLUOROSCOPIC GUIDANCE ANESTHESIA/SEDATION: 2.0 mg IV Versed; 100 mcg IV Fentanyl Total Moderate Sedation Time:  46 minutes The patient's level of consciousness and physiologic status were continuously monitored during the procedure by Radiology nursing. Additional Medications: 1 g IV vancomycin. Vancomycin was given within two hours of incision. Vancomycin was given due to an antibiotic allergy. FLUOROSCOPY TIME:  1 minutes and 54 seconds.  62.4 mGy. PROCEDURE: The procedure, risks, benefits, and alternatives were explained to the patient. Questions regarding the procedure were encouraged and answered. The patient understands and consents to the procedure. A time-out was performed prior to initiating the procedure. Ultrasound was utilized to confirm patency of the left internal jugular vein. The left neck and chest were prepped with chlorhexidine in a sterile fashion, and a sterile drape was applied covering the operative field. Maximum barrier sterile technique with sterile gowns and gloves were used for the procedure. Local anesthesia was provided with 1% lidocaine. After creating a small venotomy incision, a 21 gauge needle was advanced into the left internal jugular vein under direct, real-time ultrasound guidance. Ultrasound image documentation was performed. After securing guidewire access, an 8 Fr dilator was placed. A J-wire was kinked to measure appropriate catheter length. A subcutaneous port pocket was then created along the upper chest wall utilizing sharp and blunt dissection. Portable cautery was utilized. The pocket was irrigated with  sterile saline. A single lumen power injectable port was chosen for placement. The 8 Fr catheter was tunneled from the port pocket site to the venotomy incision. The port was placed in the pocket. External catheter was trimmed to appropriate length based on guidewire measurement. At the venotomy, an 8 Fr peel-away sheath was placed over a guidewire. The catheter was then placed through the sheath and the sheath removed. Final catheter positioning was confirmed and documented with a fluoroscopic spot image. The port was accessed with a needle and aspirated and flushed with heparinized saline. The access needle was removed. The venotomy and port pocket incisions were closed with subcutaneous 3-0 Monocryl and subcuticular 4-0 Vicryl. Dermabond was applied to both incisions. COMPLICATIONS: COMPLICATIONS None FINDINGS: After catheter placement, the tip lies at the cavo-atrial junction.  The catheter aspirates normally and is ready for immediate use. IMPRESSION: Placement of single lumen port a cath via left internal jugular vein. The catheter tip lies at the cavo-atrial junction. A power injectable port a cath was placed and is ready for immediate use. Electronically Signed   By: Aletta Edouard M.D.   On: 05/24/2017 12:53   Ir Fluoro Guide Port Insertion Left  Result Date: 05/24/2017 CLINICAL DATA:  Right lung adenocarcinoma, stage IIIA. Port-A-Cath needed for chemotherapy. EXAM: IMPLANTED PORT A CATH PLACEMENT WITH ULTRASOUND AND FLUOROSCOPIC GUIDANCE ANESTHESIA/SEDATION: 2.0 mg IV Versed; 100 mcg IV Fentanyl Total Moderate Sedation Time:  46 minutes The patient's level of consciousness and physiologic status were continuously monitored during the procedure by Radiology nursing. Additional Medications: 1 g IV vancomycin. Vancomycin was given within two hours of incision. Vancomycin was given due to an antibiotic allergy. FLUOROSCOPY TIME:  1 minutes and 54 seconds.  62.4 mGy. PROCEDURE: The procedure, risks,  benefits, and alternatives were explained to the patient. Questions regarding the procedure were encouraged and answered. The patient understands and consents to the procedure. A time-out was performed prior to initiating the procedure. Ultrasound was utilized to confirm patency of the left internal jugular vein. The left neck and chest were prepped with chlorhexidine in a sterile fashion, and a sterile drape was applied covering the operative field. Maximum barrier sterile technique with sterile gowns and gloves were used for the procedure. Local anesthesia was provided with 1% lidocaine. After creating a small venotomy incision, a 21 gauge needle was advanced into the left internal jugular vein under direct, real-time ultrasound guidance. Ultrasound image documentation was performed. After securing guidewire access, an 8 Fr dilator was placed. A J-wire was kinked to measure appropriate catheter length. A subcutaneous port pocket was then created along the upper chest wall utilizing sharp and blunt dissection. Portable cautery was utilized. The pocket was irrigated with sterile saline. A single lumen power injectable port was chosen for placement. The 8 Fr catheter was tunneled from the port pocket site to the venotomy incision. The port was placed in the pocket. External catheter was trimmed to appropriate length based on guidewire measurement. At the venotomy, an 8 Fr peel-away sheath was placed over a guidewire. The catheter was then placed through the sheath and the sheath removed. Final catheter positioning was confirmed and documented with a fluoroscopic spot image. The port was accessed with a needle and aspirated and flushed with heparinized saline. The access needle was removed. The venotomy and port pocket incisions were closed with subcutaneous 3-0 Monocryl and subcuticular 4-0 Vicryl. Dermabond was applied to both incisions. COMPLICATIONS: COMPLICATIONS None FINDINGS: After catheter placement, the tip  lies at the cavo-atrial junction. The catheter aspirates normally and is ready for immediate use. IMPRESSION: Placement of single lumen port a cath via left internal jugular vein. The catheter tip lies at the cavo-atrial junction. A power injectable port a cath was placed and is ready for immediate use. Electronically Signed   By: Aletta Edouard M.D.   On: 05/24/2017 12:53    ASSESSMENT AND PLAN:  This is a very pleasant 76 years old white male with a stage IIIa non-small cell lung cancer and currently undergoing a course of concurrent chemoradiation with weekly carboplatin and paclitaxel status post 5 cycles. Has been tolerating this treatment well except for mild odynophagia and recent epigastric pain. He received pain medication from his radiation oncologist in [place]. I recommended for the patient to proceed with the last  2 doses of his treatment as a scheduled. I will see him back for follow-up visit in 6 weeks for evaluation after repeating CT scan of the chest for restaging of his disease. He was advised to call immediately if he has any concerning symptoms in the interval. The patient voices understanding of current disease status and treatment options and is in agreement with the current care plan. All questions were answered. The patient knows to call the clinic with any problems, questions or concerns. We can certainly see the patient much sooner if necessary. I spent 10 minutes counseling the patient face to face. The total time spent in the appointment was 15 minutes.  Disclaimer: This note was dictated with voice recognition software. Similar sounding words can inadvertently be transcribed and may not be corrected upon review.

## 2017-06-04 NOTE — Telephone Encounter (Signed)
Scheduled appt per 7/23 los - patient to get new schedule in the treatment area -. Unable to schedule appt while in scheduling due to RN in chart.

## 2017-06-05 ENCOUNTER — Ambulatory Visit: Payer: Medicare HMO | Admitting: Internal Medicine

## 2017-06-11 ENCOUNTER — Ambulatory Visit (HOSPITAL_BASED_OUTPATIENT_CLINIC_OR_DEPARTMENT_OTHER): Payer: Medicare HMO

## 2017-06-11 ENCOUNTER — Other Ambulatory Visit (HOSPITAL_BASED_OUTPATIENT_CLINIC_OR_DEPARTMENT_OTHER): Payer: Medicare HMO

## 2017-06-11 VITALS — BP 145/85 | HR 90 | Temp 97.9°F | Resp 18

## 2017-06-11 DIAGNOSIS — C3491 Malignant neoplasm of unspecified part of right bronchus or lung: Secondary | ICD-10-CM

## 2017-06-11 DIAGNOSIS — C3411 Malignant neoplasm of upper lobe, right bronchus or lung: Secondary | ICD-10-CM

## 2017-06-11 DIAGNOSIS — Z5111 Encounter for antineoplastic chemotherapy: Secondary | ICD-10-CM | POA: Diagnosis not present

## 2017-06-11 LAB — CBC WITH DIFFERENTIAL/PLATELET
BASO%: 0.9 % (ref 0.0–2.0)
Basophils Absolute: 0 10*3/uL (ref 0.0–0.1)
EOS%: 4.7 % (ref 0.0–7.0)
Eosinophils Absolute: 0.2 10*3/uL (ref 0.0–0.5)
HEMATOCRIT: 32.4 % — AB (ref 38.4–49.9)
HEMOGLOBIN: 10.3 g/dL — AB (ref 13.0–17.1)
LYMPH#: 0.4 10*3/uL — AB (ref 0.9–3.3)
LYMPH%: 11.5 % — ABNORMAL LOW (ref 14.0–49.0)
MCH: 29.3 pg (ref 27.2–33.4)
MCHC: 31.8 g/dL — ABNORMAL LOW (ref 32.0–36.0)
MCV: 92.3 fL (ref 79.3–98.0)
MONO#: 0.5 10*3/uL (ref 0.1–0.9)
MONO%: 14.9 % — ABNORMAL HIGH (ref 0.0–14.0)
NEUT#: 2.2 10*3/uL (ref 1.5–6.5)
NEUT%: 68 % (ref 39.0–75.0)
PLATELETS: 199 10*3/uL (ref 140–400)
RBC: 3.51 10*6/uL — ABNORMAL LOW (ref 4.20–5.82)
RDW: 22.2 % — ABNORMAL HIGH (ref 11.0–14.6)
WBC: 3.2 10*3/uL — ABNORMAL LOW (ref 4.0–10.3)

## 2017-06-11 LAB — COMPREHENSIVE METABOLIC PANEL
ALBUMIN: 3.6 g/dL (ref 3.5–5.0)
ALK PHOS: 86 U/L (ref 40–150)
ALT: 10 U/L (ref 0–55)
ANION GAP: 7 meq/L (ref 3–11)
AST: 14 U/L (ref 5–34)
BILIRUBIN TOTAL: 0.29 mg/dL (ref 0.20–1.20)
BUN: 16.3 mg/dL (ref 7.0–26.0)
CALCIUM: 9.3 mg/dL (ref 8.4–10.4)
CO2: 28 mEq/L (ref 22–29)
CREATININE: 1.5 mg/dL — AB (ref 0.7–1.3)
Chloride: 105 mEq/L (ref 98–109)
EGFR: 47 mL/min/{1.73_m2} — ABNORMAL LOW (ref >90)
Glucose: 168 mg/dl — ABNORMAL HIGH (ref 70–140)
Potassium: 4 mEq/L (ref 3.5–5.1)
Sodium: 140 mEq/L (ref 136–145)
TOTAL PROTEIN: 6.6 g/dL (ref 6.4–8.3)

## 2017-06-11 MED ORDER — HEPARIN SOD (PORK) LOCK FLUSH 100 UNIT/ML IV SOLN
500.0000 [IU] | Freq: Once | INTRAVENOUS | Status: DC | PRN
  Filled 2017-06-11: qty 5

## 2017-06-11 MED ORDER — SODIUM CHLORIDE 0.9 % IV SOLN
50.0000 mg | Freq: Once | INTRAVENOUS | Status: AC
  Administered 2017-06-11: 50 mg via INTRAVENOUS
  Filled 2017-06-11: qty 1

## 2017-06-11 MED ORDER — SODIUM CHLORIDE 0.9 % IV SOLN
45.0000 mg/m2 | Freq: Once | INTRAVENOUS | Status: AC
  Administered 2017-06-11: 102 mg via INTRAVENOUS
  Filled 2017-06-11: qty 17

## 2017-06-11 MED ORDER — FAMOTIDINE IN NACL 20-0.9 MG/50ML-% IV SOLN
INTRAVENOUS | Status: AC
  Filled 2017-06-11: qty 50

## 2017-06-11 MED ORDER — FAMOTIDINE IN NACL 20-0.9 MG/50ML-% IV SOLN
20.0000 mg | Freq: Once | INTRAVENOUS | Status: AC
  Administered 2017-06-11: 20 mg via INTRAVENOUS

## 2017-06-11 MED ORDER — SODIUM CHLORIDE 0.9 % IV SOLN
Freq: Once | INTRAVENOUS | Status: AC
  Administered 2017-06-11: 14:00:00 via INTRAVENOUS

## 2017-06-11 MED ORDER — SODIUM CHLORIDE 0.9 % IV SOLN
162.8000 mg | Freq: Once | INTRAVENOUS | Status: AC
  Administered 2017-06-11: 160 mg via INTRAVENOUS
  Filled 2017-06-11: qty 16

## 2017-06-11 MED ORDER — PALONOSETRON HCL INJECTION 0.25 MG/5ML
0.2500 mg | Freq: Once | INTRAVENOUS | Status: AC
  Administered 2017-06-11: 0.25 mg via INTRAVENOUS

## 2017-06-11 MED ORDER — PALONOSETRON HCL INJECTION 0.25 MG/5ML
INTRAVENOUS | Status: AC
  Filled 2017-06-11: qty 5

## 2017-06-11 MED ORDER — SODIUM CHLORIDE 0.9 % IV SOLN
20.0000 mg | Freq: Once | INTRAVENOUS | Status: AC
  Administered 2017-06-11: 20 mg via INTRAVENOUS
  Filled 2017-06-11: qty 2

## 2017-06-11 MED ORDER — SODIUM CHLORIDE 0.9% FLUSH
10.0000 mL | INTRAVENOUS | Status: DC | PRN
  Filled 2017-06-11: qty 10

## 2017-06-11 NOTE — Patient Instructions (Signed)
[facility] Discharge Instructions for Patients Receiving Chemotherapy  Today you received the following chemotherapy agents Taxol and Carboplatin. To help prevent nausea and vomiting after your treatment, we encourage you to take your nausea medication as directed.  If you develop nausea and vomiting that is not controlled by your nausea medication, call the clinic.   BELOW ARE SYMPTOMS THAT SHOULD BE REPORTED IMMEDIATELY:  *FEVER GREATER THAN 100.5 F  *CHILLS WITH OR WITHOUT FEVER  NAUSEA AND VOMITING THAT IS NOT CONTROLLED WITH YOUR NAUSEA MEDICATION  *UNUSUAL SHORTNESS OF BREATH  *UNUSUAL BRUISING OR BLEEDING  TENDERNESS IN MOUTH AND THROAT WITH OR WITHOUT PRESENCE OF ULCERS  *URINARY PROBLEMS  *BOWEL PROBLEMS  UNUSUAL RASH Items with * indicate a potential emergency and should be followed up as soon as possible.  Feel free to call the clinic you have any questions or concerns. The clinic phone number is [phone].  Please show the CHEMO ALERT CARD at check-in to the Emergency Department and triage nurse.    

## 2017-07-04 ENCOUNTER — Other Ambulatory Visit: Payer: Self-pay | Admitting: Neurology

## 2017-07-04 DIAGNOSIS — R413 Other amnesia: Secondary | ICD-10-CM

## 2017-07-12 ENCOUNTER — Other Ambulatory Visit (HOSPITAL_BASED_OUTPATIENT_CLINIC_OR_DEPARTMENT_OTHER): Payer: Medicare HMO

## 2017-07-12 ENCOUNTER — Ambulatory Visit (HOSPITAL_COMMUNITY)
Admission: RE | Admit: 2017-07-12 | Discharge: 2017-07-12 | Disposition: A | Discharge status: Home / Self Care | Payer: Medicare HMO | Source: Ambulatory Visit | Attending: Internal Medicine | Admitting: Internal Medicine

## 2017-07-12 ENCOUNTER — Encounter: Payer: Self-pay | Admitting: Internal Medicine

## 2017-07-12 DIAGNOSIS — J439 Emphysema, unspecified: Secondary | ICD-10-CM | POA: Insufficient documentation

## 2017-07-12 DIAGNOSIS — I700 Atherosclerosis of aorta: Secondary | ICD-10-CM | POA: Insufficient documentation

## 2017-07-12 DIAGNOSIS — C3491 Malignant neoplasm of unspecified part of right bronchus or lung: Secondary | ICD-10-CM

## 2017-07-12 DIAGNOSIS — Z5111 Encounter for antineoplastic chemotherapy: Secondary | ICD-10-CM | POA: Diagnosis not present

## 2017-07-12 DIAGNOSIS — C3411 Malignant neoplasm of upper lobe, right bronchus or lung: Secondary | ICD-10-CM | POA: Diagnosis not present

## 2017-07-12 DIAGNOSIS — D6481 Anemia due to antineoplastic chemotherapy: Secondary | ICD-10-CM | POA: Insufficient documentation

## 2017-07-12 DIAGNOSIS — I2510 Atherosclerotic heart disease of native coronary artery without angina pectoris: Secondary | ICD-10-CM | POA: Insufficient documentation

## 2017-07-12 DIAGNOSIS — T451X5A Adverse effect of antineoplastic and immunosuppressive drugs, initial encounter: Secondary | ICD-10-CM | POA: Insufficient documentation

## 2017-07-12 LAB — CBC WITH DIFFERENTIAL/PLATELET
BASO%: 0.8 % (ref 0.0–2.0)
BASOS ABS: 0.1 10*3/uL (ref 0.0–0.1)
EOS ABS: 0.7 10*3/uL — AB (ref 0.0–0.5)
EOS%: 8.2 % — ABNORMAL HIGH (ref 0.0–7.0)
HCT: 34.2 % — ABNORMAL LOW (ref 38.4–49.9)
HGB: 11.4 g/dL — ABNORMAL LOW (ref 13.0–17.1)
LYMPH%: 8.5 % — AB (ref 14.0–49.0)
MCH: 30.1 pg (ref 27.2–33.4)
MCHC: 33.2 g/dL (ref 32.0–36.0)
MCV: 90.7 fL (ref 79.3–98.0)
MONO#: 0.7 10*3/uL (ref 0.1–0.9)
MONO%: 7.5 % (ref 0.0–14.0)
NEUT#: 6.5 10*3/uL (ref 1.5–6.5)
NEUT%: 75 % (ref 39.0–75.0)
PLATELETS: 285 10*3/uL (ref 140–400)
RBC: 3.78 10*6/uL — AB (ref 4.20–5.82)
RDW: 23 % — ABNORMAL HIGH (ref 11.0–14.6)
WBC: 8.7 10*3/uL (ref 4.0–10.3)
lymph#: 0.7 10*3/uL — ABNORMAL LOW (ref 0.9–3.3)

## 2017-07-12 LAB — COMPREHENSIVE METABOLIC PANEL
ALK PHOS: 100 U/L (ref 40–150)
ALT: 9 U/L (ref 0–55)
ANION GAP: 9 meq/L (ref 3–11)
AST: 13 U/L (ref 5–34)
Albumin: 3.5 g/dL (ref 3.5–5.0)
BILIRUBIN TOTAL: 0.25 mg/dL (ref 0.20–1.20)
BUN: 17.4 mg/dL (ref 7.0–26.0)
CO2: 29 meq/L (ref 22–29)
CREATININE: 1.5 mg/dL — AB (ref 0.7–1.3)
Calcium: 10 mg/dL (ref 8.4–10.4)
Chloride: 105 mEq/L (ref 98–109)
EGFR: 46 mL/min/{1.73_m2} — AB (ref >90)
GLUCOSE: 178 mg/dL — AB (ref 70–140)
POTASSIUM: 4 meq/L (ref 3.5–5.1)
SODIUM: 144 meq/L (ref 136–145)
Total Protein: 7.3 g/dL (ref 6.4–8.3)

## 2017-07-12 MED ORDER — IOPAMIDOL (ISOVUE-300) INJECTION 61%
INTRAVENOUS | Status: AC
  Filled 2017-07-12: qty 75

## 2017-07-12 MED ORDER — IOPAMIDOL (ISOVUE-300) INJECTION 61%
75.0000 mL | Freq: Once | INTRAVENOUS | Status: AC | PRN
  Administered 2017-07-12: 60 mL via INTRAVENOUS

## 2017-07-12 NOTE — Progress Notes (Signed)
Patient and spouse came in to inquire about financial assistance.  Reviewed my previous notes and billing. Advised them there are no foundations that have funds available for his diagnosis and he is no longer in treatment. Asked if they have completed application for the [employer]. Tjhey state no. Advised this could help free up money to allow money for medical bills as well as the one-time $400 [place] that has not been used. They verbalized understanding.  Mentioned [employer] program to them as well as contacting billing to set up payment arrangements. They verbalized understanding.   They have my card for any additional financial questions or concerns.

## 2017-07-17 ENCOUNTER — Telehealth: Payer: Self-pay | Admitting: Internal Medicine

## 2017-07-17 ENCOUNTER — Encounter: Payer: Self-pay | Admitting: Internal Medicine

## 2017-07-17 ENCOUNTER — Ambulatory Visit (HOSPITAL_BASED_OUTPATIENT_CLINIC_OR_DEPARTMENT_OTHER): Payer: Medicare HMO | Admitting: Internal Medicine

## 2017-07-17 VITALS — BP 152/69 | HR 86 | Temp 98.2°F | Resp 18 | Wt 222.7 lb

## 2017-07-17 DIAGNOSIS — Z5111 Encounter for antineoplastic chemotherapy: Secondary | ICD-10-CM

## 2017-07-17 DIAGNOSIS — C3491 Malignant neoplasm of unspecified part of right bronchus or lung: Secondary | ICD-10-CM

## 2017-07-17 DIAGNOSIS — J449 Chronic obstructive pulmonary disease, unspecified: Secondary | ICD-10-CM

## 2017-07-17 DIAGNOSIS — C3411 Malignant neoplasm of upper lobe, right bronchus or lung: Secondary | ICD-10-CM

## 2017-07-17 NOTE — Telephone Encounter (Signed)
Scheduled appt per  9/4 los - Gave patient AVS and calender per los. Central radiology to contact patient with ct schedule.

## 2017-07-17 NOTE — Progress Notes (Signed)
[place] [phone]   [phone]  OFFICE PROGRESS NOTE  [name], DO [address] [place] [address]  DIAGNOSIS: Stage IIIA (T1a, N2, M0) non-small cell lung cancer questionable for adenocarcinoma presented with right upper lobe lung nodule in addition to mediastinal lymphadenopathy diagnosed in May 2018.  PRIOR THERAPY: Concurrent chemoradiation with weekly carboplatin for AUC of 2 and paclitaxel 45 MG/M2. Status post 7 cycles. Last dose was given 06/11/2017.  CURRENT THERAPY: Observation.  INTERVAL HISTORY: Rodney Orozco 76 y.o. male returns to the clinic today for follow-up visit accompanied by his wife and daughter. The patient is feeling fine today with no specific complaints. He is recovering from the course of concurrent chemoradiation fairly well. He denied having any current chest pain, shortness of breath, cough or hemoptysis. He denied having any fever or chills. He has no nausea, vomiting, diarrhea or constipation. The patient denied having any headache or visual changes. He lost several pounds during the course of concurrent chemoradiation but he is gaining his weight back. He had repeat CT scan of the chest performed recently and he is here for evaluation and discussion of his scan results.  MEDICAL HISTORY: Past Medical History:  Diagnosis Date  . AAA (abdominal aortic aneurysm) ([place]) 2017  . Asthma   . Cardiomyopathy ([place])   . Dementia   . Dyslipidemia   . GI bleed   . Goals of care, counseling/discussion 04/20/2017  . HCAP (healthcare-associated pneumonia) 12/2013   01/06/2014  . Hypertension   . Mixed hyperlipidemia   . On home oxygen therapy    2L during the day  . OSA on CPAP    actually BIPAP  . Osteoarthritis   . Paroxysmal atrial fibrillation ([facility])    takes Warfarin  . Pneumonia December 2014   Had hemoptysis and admitted at [facility].  . Pulmonary embolism ([place]) 12/2014   S/P knee  replacement/notes 08/31/2016  . TIA (transient ischemic attack)   . Type II diabetes mellitus ([place])   . Vitamin D deficiency     ALLERGIES:  is allergic to codeine; tramadol; warfarin and related; lipitor [atorvastatin]; and penicillins.  MEDICATIONS:  Current Outpatient Prescriptions  Medication Sig Dispense Refill  . acetaminophen (TYLENOL) 500 MG tablet Take 1,000 mg by mouth 2 (two) times daily as needed for headache.    Marland Kitchen amLODipine (NORVASC) 5 MG tablet Take 1 tablet (5 mg total) by mouth daily. 30 tablet 12  . aspirin EC 81 MG tablet Take 1 tablet (81 mg total) by mouth daily. 90 tablet 3  . clopidogrel (PLAVIX) 75 MG tablet Take 1 tablet (75 mg total) by mouth daily. 30 tablet 12  . Diphenhyd-Hydrocort-Nystatin (FIRST-DUKES MOUTHWASH MT) Take 10 mLs by mouth 4 (four) times daily.    Marland Kitchen donepezil (ARICEPT) 10 MG tablet TAKE ONE TABLET BY MOUTH AT BEDTIME 90 tablet 4  . famotidine (PEPCID) 20 MG tablet Take 20 mg by mouth 2 (two) times daily.     Marland Kitchen FeFum-FePoly-FA-B Cmp-C-Biot (INTEGRA PLUS) CAPS Take 1 capsule by mouth every morning. 30 capsule 2  . flintstones complete (FLINTSTONES) 60 MG chewable tablet Chew 1 tablet by mouth 2 (two) times daily.    . furosemide (LASIX) 40 MG tablet Take 1 tablet (40 mg total) by mouth daily. 30 tablet   . losartan (COZAAR) 100 MG tablet Take 100 mg by mouth daily.    . metFORMIN (GLUCOPHAGE) 1000 MG tablet Take 1,000  mg by mouth 2 (two) times daily.     . metoprolol tartrate (LOPRESSOR) 25 MG tablet Take 25 mg by mouth 2 (two) times daily.     . nitroGLYCERIN (NITROSTAT) 0.4 MG SL tablet Place 1 tablet (0.4 mg total) under the tongue every 5 (five) minutes as needed for chest pain. MAX 3 dose 25 tablet 3  . NOVOLIN N RELION 100 UNIT/ML injection Inject 0.05 mLs (5 Units total) into the skin 2 (two) times daily. (Patient taking differently: Inject 10 Units into the skin 2 (two) times daily. ) 10 mL 11  . oxyCODONE (OXY IR/ROXICODONE) 5 MG immediate  release tablet Take 5 mg by mouth every 6 (six) hours as needed.    . OXYGEN Inhale 2 L into the lungs at bedtime. 2lpm 24/7  DME- [facility]     . prochlorperazine (COMPAZINE) 10 MG tablet Take 1 tablet (10 mg total) by mouth every 6 (six) hours as needed for nausea or vomiting. 30 tablet 0  . VICTOZA 18 MG/3ML SOPN Inject 1.2 mg into the skin every morning.      No current facility-administered medications for this visit.     SURGICAL HISTORY:  Past Surgical History:  Procedure Laterality Date  . ABDOMINAL AORTIC ANEURYSM REPAIR  08/31/2016  . ABDOMINAL AORTIC ENDOVASCULAR STENT GRAFT N/A 08/31/2016   Procedure: ABDOMINAL AORTIC ENDOVASCULAR STENT GRAFT and Open Exposure left common femoral artery;  Surgeon: Angelia Mould, MD;  Location: [facility];  Service: Vascular;  Laterality: N/A;  . ABDOMINAL SURGERY  1999   "for aneurysm"  . ABDOMINAL SURGERY     bleeding PUD in setting of aspirin powders. REMOTE. also H.pylori positive per epic notes  . BACK SURGERY    . CARDIAC CATHETERIZATION N/A 12/05/2016   Procedure: Right/Left Heart Cath and Coronary Angiography;  Surgeon: Jettie Booze, MD;  Location: [place] LAB;  Service: Cardiovascular;  Laterality: N/A;  . CARDIAC CATHETERIZATION N/A 12/05/2016   Procedure: Coronary Stent Intervention;  Surgeon: Jettie Booze, MD;  Location: [place];  Service: Cardiovascular;  Laterality: N/A;  . COLONOSCOPY N/A 01/28/2017   Procedure: COLONOSCOPY;  Surgeon: Rogene Houston, MD;  Location[facility];  Service: Endoscopy;  Laterality: N/A;  . COLONOSCOPY WITH ESOPHAGOGASTRODUODENOSCOPY (EGD)  2004   Dr. Amedeo Plenty: antral gastritis, hypersplastic rectal polyp, few scattered diverticula  . ESOPHAGOGASTRODUODENOSCOPY N/A 01/27/2017   Procedure: ESOPHAGOGASTRODUODENOSCOPY (EGD);  Surgeon: Rogene Houston, MD;  Location: [facility];  Service: Endoscopy;  Laterality: N/A;  . IR FLUORO GUIDE PORT INSERTION LEFT  05/23/2017  . IR  US GUIDE VASC ACCESS LEFT  05/23/2017  . JOINT REPLACEMENT    . [place]  . TOTAL KNEE ARTHROPLASTY Bilateral Feb 2016 - Dec 2016  . VIDEO BRONCHOSCOPY WITH ENDOBRONCHIAL ULTRASOUND N/A 04/11/2017   Procedure: VIDEO BRONCHOSCOPY WITH ENDOBRONCHIAL ULTRASOUND;  Surgeon: Prescott Gum, Collier Salina, MD;  Location: [facility];  Service: Thoracic;  Laterality: N/A;    REVIEW OF SYSTEMS:  Constitutional: positive for fatigue and weight loss Eyes: negative Ears, nose, mouth, throat, and face: negative Respiratory: positive for dyspnea on exertion Cardiovascular: negative Gastrointestinal: negative Genitourinary:negative Integument/breast: negative Hematologic/lymphatic: negative Musculoskeletal:negative Neurological: negative Behavioral/Psych: negative Endocrine: negative Allergic/Immunologic: negative   PHYSICAL EXAMINATION: General appearance: alert, cooperative, fatigued and no distress Head: Normocephalic, without obvious abnormality, atraumatic Neck: no adenopathy, no JVD, supple, symmetrical, trachea midline and thyroid not enlarged, symmetric, no tenderness/mass/nodules Lymph nodes: Cervical, supraclavicular, and axillary nodes normal.  Resp: clear to auscultation bilaterally Back: symmetric, no curvature. ROM normal. No CVA tenderness. Cardio: regular rate and rhythm, S1, S2 normal, no murmur, click, rub or gallop GI: soft, non-tender; bowel sounds normal; no masses,  no organomegaly Extremities: extremities normal, atraumatic, no cyanosis or edema Neurologic: Alert and oriented X 3, normal strength and tone. Normal symmetric reflexes. Normal coordination and gait  ECOG PERFORMANCE STATUS: 1 - Symptomatic but completely ambulatory  Blood pressure (!) 152/69, pulse 86, temperature 98.2 F (36.8 C), temperature source Oral, resp. rate 18, weight 222 lb 11.2 oz (101 kg), SpO2 96 %.  LABORATORY DATA: Lab Results  Component Value Date   WBC 8.7 07/12/2017   HGB 11.4 (L)  07/12/2017   HCT 34.2 (L) 07/12/2017   MCV 90.7 07/12/2017   PLT 285 07/12/2017      Chemistry      Component Value Date/Time   NA 144 07/12/2017 0957   K 4.0 07/12/2017 0957   CL 104 04/06/2017 1129   CO2 29 07/12/2017 0957   BUN 17.4 07/12/2017 0957   CREATININE 1.5 (H) 07/12/2017 0957      Component Value Date/Time   CALCIUM 10.0 07/12/2017 0957   ALKPHOS 100 07/12/2017 0957   AST 13 07/12/2017 0957   ALT 9 07/12/2017 0957   BILITOT 0.25 07/12/2017 0957       RADIOGRAPHIC STUDIES: Ct Chest W Contrast  Result Date: 07/12/2017 CLINICAL DATA:  Stage IIIA non-small cell lung cancer.  Follow-up. EXAM: CT CHEST WITH CONTRAST TECHNIQUE: Multidetector CT imaging of the chest was performed during intravenous contrast administration. CONTRAST:  36mL ISOVUE-300 IOPAMIDOL (ISOVUE-300) INJECTION 61% COMPARISON:  PET-CT from 03/28/2017 FINDINGS: Cardiovascular: The heart size appears normal. No pericardial effusion. Aortic atherosclerosis. Calcification involving the LAD, left circumflex and RCA coronary artery noted. Mediastinum/Nodes: The trachea appears patent and is midline. Normal appearance of the esophagus. Right paratracheal lymph node measures 1 cm, image 45 of series 2. Previously 2.1 cm. Pre-vascular lymph node Measures 1.1 cm, image 54 of series 2. Previously 1.6 cm. Sub- carinal node measures 1.3 cm, image 71 of series 2. Previously 1.5 cm. Lungs/Pleura: Pleural calcifications overlying the right lung are again noted and appears similar to previous exam. Moderate changes of centrilobular and paraseptal emphysema. Index nodule within the right midlung measures 1 cm, image 61 of series 5. Previously 1.1 cm. The index spiculated nodule within the right upper lobe measures 1 cm, image 48 of series 5. Previously 1.8 cm. Upper Abdomen: No acute abnormality. Musculoskeletal: There is degenerative disc disease identified within the thoracic spine. No aggressive lytic or sclerotic bone lesions.  IMPRESSION: 1. Interval response to therapy. The index spiculated nodule within the right upper lobe has decreased in size when compared with initial staging PET-CT from 03/28/2017. There has also been interval decrease in size of right paratracheal and left prevascular nodal metastases. Stable sub- carinal lymph node. 2. Right mid lung nodule measuring 1 cm is stable when compared with previous exam. 3. No new or progressive disease identified. 4. Aortic Atherosclerosis (ICD10-I70.0) and Emphysema (ICD10-J43.9). Multi vessel coronary artery calcifications noted. Electronically Signed   By: Kerby Moors M.D.   On: 07/12/2017 14:22    ASSESSMENT AND PLAN:  This is a very pleasant 76 years old white male with a stage IIIA non-small cell lung cancer, favoring adenocarcinoma.  The patient underwent a course of concurrent chemoradiation with weekly carboplatin and paclitaxel status post 7 cycles. Has been tolerating this treatment well except for mild  odynophagia and weight loss. He had repeat CT scan of the chest performed recently. I personally and independently reviewed the scan images and discuss the results with the patient and his family. I gave the patient the option of continuous observation and close monitoring versus consideration of consolidation immunotherapy with Imfinzi (Durvalumab) 10 MG/KG every 2 weeks. I discussed with the patient the benefit of the treatment with immunotherapy as well as the adverse effects. The patient and his family made a decision not to proceed with consolidation treatment at this point. He would like to continue on observation. I will see him back for follow-up visit in 3 months for reevaluation with repeat CT scan of the chest for restaging of his disease. He was advised to call immediately if he has any concerning symptoms in the interval. The patient voices understanding of current disease status and treatment options and is in agreement with the current care  plan. All questions were answered. The patient knows to call the clinic with any problems, questions or concerns. We can certainly see the patient much sooner if necessary.  Disclaimer: This note was dictated with voice recognition software. Similar sounding words can inadvertently be transcribed and may not be corrected upon review.

## 2017-07-26 ENCOUNTER — Ambulatory Visit: Payer: Medicare HMO | Admitting: Adult Health

## 2017-07-31 ENCOUNTER — Ambulatory Visit (INDEPENDENT_AMBULATORY_CARE_PROVIDER_SITE_OTHER): Payer: Medicare HMO | Admitting: Internal Medicine

## 2017-07-31 ENCOUNTER — Encounter (INDEPENDENT_AMBULATORY_CARE_PROVIDER_SITE_OTHER): Payer: Self-pay | Admitting: Internal Medicine

## 2017-07-31 VITALS — BP 120/80 | HR 73 | Temp 97.2°F | Resp 18 | Ht 70.0 in | Wt 216.1 lb

## 2017-07-31 DIAGNOSIS — D500 Iron deficiency anemia secondary to blood loss (chronic): Secondary | ICD-10-CM

## 2017-07-31 NOTE — Progress Notes (Signed)
Presenting complaint;  Follow-up for anemia and GI bleed.  Subjective:  Rodney Orozco is 76 year old Caucasian male who is here for scheduled visit accompanied by his wife Rodney Orozco type. He was last seen on 03/13/2017 when he was doing well. He was supposed to have iron studies but he has not done so. He states he is been busy with chemotherapy and radiation for his lung carcinoma. He'll last cycle of chemotherapy on 05/30/2017 and he finished radiation about a week later. He decided not to proceed with immunotherapy. He states he has had blood transfusion twice since his last visit. He received a unit of PRBCs last month. He is aware that his hemoglobin dropped due to chemotherapy. He did not experience nausea vomiting hematemesis melena or rectal bleeding. He says his appetite is getting better but not back to normal. He said he lost weight down to 212 pounds. He skin for back. He has noted increasing difficulty breathing since his last treatment. His wife states he is using oxygen more often. He experienced dysphagia while receiving radiation therapy but he is not having any more difficulty. He also denies frequent heartburn.    Current Medications: Outpatient Encounter Prescriptions as of 07/31/2017  Medication Sig  . acetaminophen (TYLENOL) 500 MG tablet Take 1,000 mg by mouth 2 (two) times daily as needed for headache.  Marland Kitchen amLODipine (NORVASC) 5 MG tablet Take 1 tablet (5 mg total) by mouth daily.  Marland Kitchen aspirin EC 81 MG tablet Take 1 tablet (81 mg total) by mouth daily.  . clopidogrel (PLAVIX) 75 MG tablet Take 1 tablet (75 mg total) by mouth daily.  Marland Kitchen donepezil (ARICEPT) 10 MG tablet TAKE ONE TABLET BY MOUTH AT BEDTIME  . famotidine (PEPCID) 20 MG tablet Take 20 mg by mouth 2 (two) times daily.   . flintstones complete (FLINTSTONES) 60 MG chewable tablet Chew 1 tablet by mouth 2 (two) times daily.  . furosemide (LASIX) 40 MG tablet Take 1 tablet (40 mg total) by mouth daily.  Marland Kitchen losartan (COZAAR) 100  MG tablet Take 100 mg by mouth daily.  . metFORMIN (GLUCOPHAGE) 1000 MG tablet Take 1,000 mg by mouth 2 (two) times daily.   . metoprolol tartrate (LOPRESSOR) 25 MG tablet Take 25 mg by mouth 2 (two) times daily.   . nitroGLYCERIN (NITROSTAT) 0.4 MG SL tablet Place 1 tablet (0.4 mg total) under the tongue every 5 (five) minutes as needed for chest pain. MAX 3 dose  . NOVOLIN N RELION 100 UNIT/ML injection Inject 0.05 mLs (5 Units total) into the skin 2 (two) times daily. (Patient taking differently: Inject 10 Units into the skin 2 (two) times daily. )  . oxyCODONE (OXY IR/ROXICODONE) 5 MG immediate release tablet Take 5 mg by mouth every 6 (six) hours as needed.  . OXYGEN Inhale 2 L into the lungs at bedtime. 2lpm 24/7  DME- [facility]   . prochlorperazine (COMPAZINE) 10 MG tablet Take 1 tablet (10 mg total) by mouth every 6 (six) hours as needed for nausea or vomiting.  Marland Kitchen VICTOZA 18 MG/3ML SOPN Inject 1.2 mg into the skin every morning.   . [DISCONTINUED] Diphenhyd-Hydrocort-Nystatin (FIRST-DUKES MOUTHWASH MT) Take 10 mLs by mouth 4 (four) times daily.  . [DISCONTINUED] FeFum-FePoly-FA-B Cmp-C-Biot (INTEGRA PLUS) CAPS Take 1 capsule by mouth every morning. (Patient not taking: Reported on 07/31/2017)   No facility-administered encounter medications on file as of 07/31/2017.      Objective: Blood pressure 120/80, pulse 73, temperature (!) 97.2 F (36.2 C), temperature source Oral,  resp. rate 18, height 5\' 10"  (1.778 m), weight 216 lb 1.6 oz (98 kg). Patient is alert and in no acute distress. Conjunctiva is pink. Sclera is nonicteric Oropharyngeal mucosa is normal. No neck masses or thyromegaly noted. Cardiac exam with regular rhythm normal S1 and S2. No murmur or gallop noted. Lungs are clear to auscultation but intensity of breath sounds is diminished. Abdomen is full but soft and nontender without organomegaly or masses. No LE edema or clubbing noted.  Labs/studies Results: CBC from  07/12/2017  WBC 8.7, H&H 11.4 and 34.2 and platelet count 285K. MCV 90.7.    Assessment:  #1. Anemia. He was diagnosed with anemia secondary to GI bleed when he was hospitalized about 6 months ago and underwent EGD and colonoscopy. Source of bleeding was felt to be colonic diverticulosis. He has not experienced overt GI bleed since then. He has required transfusion twice since his last visit for anemia resulting from chemotherapy. Recent hemoglobin was 11.4 which is the best hemoglobin he has had in over 6 months.  #2. History of lung carcinoma. He has finished chemoradiation. He did experience dysphagia while he was receiving radiation therapy but it has resolved. He is under care of Dr. Earlie Server.   Plan:  Hemoccult 1. Patient will call if he has rectal bleeding or melena. Office visit in one year.

## 2017-07-31 NOTE — Patient Instructions (Addendum)
Hemoccult 1. Notify if you have rectal bleeding or tarry stool.

## 2017-09-03 ENCOUNTER — Other Ambulatory Visit: Payer: Self-pay | Admitting: *Deleted

## 2017-09-03 DIAGNOSIS — Z9889 Other specified postprocedural states: Secondary | ICD-10-CM

## 2017-09-03 DIAGNOSIS — I6523 Occlusion and stenosis of bilateral carotid arteries: Secondary | ICD-10-CM

## 2017-10-16 ENCOUNTER — Other Ambulatory Visit (HOSPITAL_BASED_OUTPATIENT_CLINIC_OR_DEPARTMENT_OTHER): Payer: Medicare HMO

## 2017-10-16 ENCOUNTER — Ambulatory Visit (HOSPITAL_COMMUNITY)
Admission: RE | Admit: 2017-10-16 | Discharge: 2017-10-16 | Disposition: A | Discharge status: Home / Self Care | Payer: Medicare HMO | Source: Ambulatory Visit | Attending: Internal Medicine | Admitting: Internal Medicine

## 2017-10-16 DIAGNOSIS — Z5111 Encounter for antineoplastic chemotherapy: Secondary | ICD-10-CM

## 2017-10-16 DIAGNOSIS — I700 Atherosclerosis of aorta: Secondary | ICD-10-CM | POA: Insufficient documentation

## 2017-10-16 DIAGNOSIS — J439 Emphysema, unspecified: Secondary | ICD-10-CM | POA: Diagnosis not present

## 2017-10-16 DIAGNOSIS — J449 Chronic obstructive pulmonary disease, unspecified: Secondary | ICD-10-CM

## 2017-10-16 DIAGNOSIS — I2584 Coronary atherosclerosis due to calcified coronary lesion: Secondary | ICD-10-CM | POA: Diagnosis not present

## 2017-10-16 DIAGNOSIS — I2510 Atherosclerotic heart disease of native coronary artery without angina pectoris: Secondary | ICD-10-CM | POA: Insufficient documentation

## 2017-10-16 DIAGNOSIS — C3411 Malignant neoplasm of upper lobe, right bronchus or lung: Secondary | ICD-10-CM | POA: Diagnosis not present

## 2017-10-16 DIAGNOSIS — C3491 Malignant neoplasm of unspecified part of right bronchus or lung: Secondary | ICD-10-CM

## 2017-10-16 DIAGNOSIS — C3490 Malignant neoplasm of unspecified part of unspecified bronchus or lung: Secondary | ICD-10-CM | POA: Diagnosis not present

## 2017-10-16 LAB — CBC WITH DIFFERENTIAL/PLATELET
BASO%: 0.5 % (ref 0.0–2.0)
Basophils Absolute: 0 10*3/uL (ref 0.0–0.1)
EOS ABS: 1 10*3/uL — AB (ref 0.0–0.5)
EOS%: 11.7 % — AB (ref 0.0–7.0)
HCT: 34.9 % — ABNORMAL LOW (ref 38.4–49.9)
HGB: 11.4 g/dL — ABNORMAL LOW (ref 13.0–17.1)
LYMPH%: 9 % — AB (ref 14.0–49.0)
MCH: 29.7 pg (ref 27.2–33.4)
MCHC: 32.8 g/dL (ref 32.0–36.0)
MCV: 90.6 fL (ref 79.3–98.0)
MONO#: 0.6 10*3/uL (ref 0.1–0.9)
MONO%: 6.7 % (ref 0.0–14.0)
NEUT%: 72.1 % (ref 39.0–75.0)
NEUTROS ABS: 6.1 10*3/uL (ref 1.5–6.5)
Platelets: 303 10*3/uL (ref 140–400)
RBC: 3.85 10*6/uL — AB (ref 4.20–5.82)
RDW: 15.8 % — ABNORMAL HIGH (ref 11.0–14.6)
WBC: 8.5 10*3/uL (ref 4.0–10.3)
lymph#: 0.8 10*3/uL — ABNORMAL LOW (ref 0.9–3.3)

## 2017-10-16 LAB — COMPREHENSIVE METABOLIC PANEL
ALT: 12 U/L (ref 0–55)
AST: 14 U/L (ref 5–34)
Albumin: 3.9 g/dL (ref 3.5–5.0)
Alkaline Phosphatase: 115 U/L (ref 40–150)
Anion Gap: 10 mEq/L (ref 3–11)
BUN: 12.7 mg/dL (ref 7.0–26.0)
CO2: 28 meq/L (ref 22–29)
Calcium: 9.5 mg/dL (ref 8.4–10.4)
Chloride: 104 mEq/L (ref 98–109)
Creatinine: 1.6 mg/dL — ABNORMAL HIGH (ref 0.7–1.3)
EGFR: 42 mL/min/{1.73_m2} — AB (ref >60)
GLUCOSE: 162 mg/dL — AB (ref 70–140)
POTASSIUM: 3.8 meq/L (ref 3.5–5.1)
SODIUM: 142 meq/L (ref 136–145)
TOTAL PROTEIN: 7.4 g/dL (ref 6.4–8.3)
Total Bilirubin: 0.29 mg/dL (ref 0.20–1.20)

## 2017-10-16 MED ORDER — IOPAMIDOL (ISOVUE-300) INJECTION 61%
INTRAVENOUS | Status: AC
  Filled 2017-10-16: qty 75

## 2017-10-16 MED ORDER — IOPAMIDOL (ISOVUE-300) INJECTION 61%
75.0000 mL | Freq: Once | INTRAVENOUS | Status: AC | PRN
  Administered 2017-10-16: 60 mL via INTRAVENOUS

## 2017-10-18 ENCOUNTER — Telehealth: Payer: Self-pay | Admitting: Internal Medicine

## 2017-10-18 ENCOUNTER — Encounter: Payer: Self-pay | Admitting: *Deleted

## 2017-10-18 ENCOUNTER — Ambulatory Visit (HOSPITAL_BASED_OUTPATIENT_CLINIC_OR_DEPARTMENT_OTHER): Payer: Medicare HMO

## 2017-10-18 ENCOUNTER — Ambulatory Visit (HOSPITAL_BASED_OUTPATIENT_CLINIC_OR_DEPARTMENT_OTHER): Payer: Medicare HMO | Admitting: Internal Medicine

## 2017-10-18 ENCOUNTER — Encounter: Payer: Self-pay | Admitting: Internal Medicine

## 2017-10-18 DIAGNOSIS — C3411 Malignant neoplasm of upper lobe, right bronchus or lung: Secondary | ICD-10-CM

## 2017-10-18 DIAGNOSIS — C3490 Malignant neoplasm of unspecified part of unspecified bronchus or lung: Secondary | ICD-10-CM

## 2017-10-18 DIAGNOSIS — I480 Paroxysmal atrial fibrillation: Secondary | ICD-10-CM | POA: Diagnosis not present

## 2017-10-18 LAB — RESEARCH LABS

## 2017-10-18 NOTE — Progress Notes (Signed)
[place] [phone]   [phone]  OFFICE PROGRESS NOTE  [name], DO [address] [place] [id number]  DIAGNOSIS: Stage IIIA (T1a, N2, M0) non-small cell lung cancer questionable for adenocarcinoma presented with right upper lobe lung nodule in addition to mediastinal lymphadenopathy diagnosed in May 2018.  PRIOR THERAPY: Concurrent chemoradiation with weekly carboplatin for AUC of 2 and paclitaxel 45 MG/M2. Status post 7 cycles. Last dose was given 06/11/2017.  CURRENT THERAPY: Observation.  INTERVAL HISTORY: Rodney Orozco 76 y.o. male returns to the clinic for follow-up visit accompanied by his wife.  The patient denied having any recent chest pain but has shortness breath with exertion with no cough or hemoptysis.  Has no significant weight loss or night sweats.  He has no nausea, vomiting, diarrhea or constipation.  He has been in observation for the last few months.  He had a repeat CT scan of the chest performed recently and is here for evaluation and discussion of his discuss results.  MEDICAL HISTORY: Past Medical History:  Diagnosis Date  . AAA (abdominal aortic aneurysm) ([facility]) 2017  . Asthma   . Cardiomyopathy ([place])   . Dementia   . Dyslipidemia   . GI bleed   . Goals of care, counseling/discussion 04/20/2017  . HCAP (healthcare-associated pneumonia) 12/2013   01/06/2014  . Hypertension   . Mixed hyperlipidemia   . On home oxygen therapy    2L during the day  . OSA on CPAP    actually BIPAP  . Osteoarthritis   . Paroxysmal atrial fibrillation ([facility])    takes Warfarin  . Pneumonia December 2014   Had hemoptysis and admitted at [facility].  . Pulmonary embolism ([place]) 12/2014   S/P knee replacement/notes 08/31/2016  . TIA (transient ischemic attack)   . Type II diabetes mellitus ([place])   . Vitamin D deficiency     ALLERGIES:  is allergic to codeine; tramadol; warfarin and related; lipitor [atorvastatin];  and penicillins.  MEDICATIONS:  Current Outpatient Medications  Medication Sig Dispense Refill  . acetaminophen (TYLENOL) 500 MG tablet Take 1,000 mg by mouth 2 (two) times daily as needed for headache.    Marland Kitchen amLODipine (NORVASC) 5 MG tablet Take 1 tablet (5 mg total) by mouth daily. 30 tablet 12  . aspirin EC 81 MG tablet Take 1 tablet (81 mg total) by mouth daily. 90 tablet 3  . clopidogrel (PLAVIX) 75 MG tablet Take 1 tablet (75 mg total) by mouth daily. 30 tablet 12  . donepezil (ARICEPT) 10 MG tablet TAKE ONE TABLET BY MOUTH AT BEDTIME 90 tablet 4  . famotidine (PEPCID) 20 MG tablet Take 20 mg by mouth 2 (two) times daily.     . flintstones complete (FLINTSTONES) 60 MG chewable tablet Chew 1 tablet by mouth 2 (two) times daily.    . furosemide (LASIX) 40 MG tablet Take 1 tablet (40 mg total) by mouth daily. 30 tablet   . losartan (COZAAR) 100 MG tablet Take 100 mg by mouth daily.    . metFORMIN (GLUCOPHAGE) 1000 MG tablet Take 1,000 mg by mouth 2 (two) times daily.     . metoprolol tartrate (LOPRESSOR) 25 MG tablet Take 25 mg by mouth 2 (two) times daily.     . nitroGLYCERIN (NITROSTAT) 0.4 MG SL tablet Place 1 tablet (0.4 mg total) under the tongue every 5 (five) minutes as needed for chest pain. MAX 3 dose 25 tablet 3  .  NOVOLIN N RELION 100 UNIT/ML injection Inject 0.05 mLs (5 Units total) into the skin 2 (two) times daily. (Patient taking differently: Inject 10 Units into the skin 2 (two) times daily. ) 10 mL 11  . oxyCODONE (OXY IR/ROXICODONE) 5 MG immediate release tablet Take 5 mg by mouth every 6 (six) hours as needed.    . OXYGEN Inhale 2 L into the lungs at bedtime. 2lpm 24/7  DME- [employer]     . prochlorperazine (COMPAZINE) 10 MG tablet Take 1 tablet (10 mg total) by mouth every 6 (six) hours as needed for nausea or vomiting. 30 tablet 0  . VICTOZA 18 MG/3ML SOPN Inject 1.2 mg into the skin every morning.      No current facility-administered medications for this visit.      SURGICAL HISTORY:  Past Surgical History:  Procedure Laterality Date  . ABDOMINAL AORTIC ANEURYSM REPAIR  08/31/2016  . ABDOMINAL AORTIC ENDOVASCULAR STENT GRAFT N/A 08/31/2016   Procedure: ABDOMINAL AORTIC ENDOVASCULAR STENT GRAFT and Open Exposure left common femoral artery;  Surgeon: Angelia Mould, MD;  Location: [facility] OR;  Service: Vascular;  Laterality: N/A;  . ABDOMINAL SURGERY  1999   "for aneurysm"  . ABDOMINAL SURGERY     bleeding PUD in setting of aspirin powders. REMOTE. also H.pylori positive per epic notes  . BACK SURGERY    . CARDIAC CATHETERIZATION N/A 12/05/2016   Procedure: Right/Left Heart Cath and Coronary Angiography;  Surgeon: Jettie Booze, MD;  Location: [facility];  Service: Cardiovascular;  Laterality: N/A;  . CARDIAC CATHETERIZATION N/A 12/05/2016   Procedure: Coronary Stent Intervention;  Surgeon: Jettie Booze, MD;  Location: [facility];  Service: Cardiovascular;  Laterality: N/A;  . COLONOSCOPY N/A 01/28/2017   Procedure: COLONOSCOPY;  Surgeon: Rogene Houston, MD;  Location: [facility];  Service: Endoscopy;  Laterality: N/A;  . COLONOSCOPY WITH ESOPHAGOGASTRODUODENOSCOPY (EGD)  2004   Dr. Amedeo Plenty: antral gastritis, hypersplastic rectal polyp, few scattered diverticula  . ESOPHAGOGASTRODUODENOSCOPY N/A 01/27/2017   Procedure: ESOPHAGOGASTRODUODENOSCOPY (EGD);  Surgeon: Rogene Houston, MD;  Location: [facility];  Service: Endoscopy;  Laterality: N/A;  . IR FLUORO GUIDE PORT INSERTION LEFT  05/23/2017  . IR US GUIDE VASC ACCESS LEFT  05/23/2017  . JOINT REPLACEMENT    . [place]  . TOTAL KNEE ARTHROPLASTY Bilateral Feb 2016 - Dec 2016  . VIDEO BRONCHOSCOPY WITH ENDOBRONCHIAL ULTRASOUND N/A 04/11/2017   Procedure: VIDEO BRONCHOSCOPY WITH ENDOBRONCHIAL ULTRASOUND;  Surgeon: Prescott Gum, Collier Salina, MD;  Location: [facility];  Service: Thoracic;  Laterality: N/A;    REVIEW OF SYSTEMS:  A comprehensive review of systems  was negative except for: Constitutional: positive for fatigue Respiratory: positive for dyspnea on exertion   PHYSICAL EXAMINATION: General appearance: alert, cooperative, fatigued and no distress Head: Normocephalic, without obvious abnormality, atraumatic Neck: no adenopathy, no JVD, supple, symmetrical, trachea midline and thyroid not enlarged, symmetric, no tenderness/mass/nodules Lymph nodes: Cervical, supraclavicular, and axillary nodes normal. Resp: clear to auscultation bilaterally Back: symmetric, no curvature. ROM normal. No CVA tenderness. Cardio: regular rate and rhythm, S1, S2 normal, no murmur, click, rub or gallop GI: soft, non-tender; bowel sounds normal; no masses,  no organomegaly Extremities: extremities normal, atraumatic, no cyanosis or edema  ECOG PERFORMANCE STATUS: 1 - Symptomatic but completely ambulatory  Blood pressure (!) 158/81, pulse 79, temperature (!) 97.5 F (36.4 C), temperature source Oral, resp. rate 19, height 5\' 10"  (1.778 m), weight 223 lb 12.8  oz (101.5 kg), SpO2 96 %.  LABORATORY DATA: Lab Results  Component Value Date   WBC 8.5 10/16/2017   HGB 11.4 (L) 10/16/2017   HCT 34.9 (L) 10/16/2017   MCV 90.6 10/16/2017   PLT 303 10/16/2017      Chemistry      Component Value Date/Time   NA 142 10/16/2017 0917   K 3.8 10/16/2017 0917   CL 104 04/06/2017 1129   CO2 28 10/16/2017 0917   BUN 12.7 10/16/2017 0917   CREATININE 1.6 (H) 10/16/2017 0917      Component Value Date/Time   CALCIUM 9.5 10/16/2017 0917   ALKPHOS 115 10/16/2017 0917   AST 14 10/16/2017 0917   ALT 12 10/16/2017 0917   BILITOT 0.29 10/16/2017 0917       RADIOGRAPHIC STUDIES: Ct Chest W Contrast  Result Date: 10/16/2017 CLINICAL DATA:  Followup lung cancer. EXAM: CT CHEST WITH CONTRAST TECHNIQUE: Multidetector CT imaging of the chest was performed during intravenous contrast administration. CONTRAST:  11mL ISOVUE-300 IOPAMIDOL (ISOVUE-300) INJECTION 61% COMPARISON:   07/12/2017 FINDINGS: Cardiovascular: The heart size appears mildly enlarged. Aortic atherosclerosis noted. Calcification within the RCA, LAD and left circumflex coronary artery noted. No pericardial effusion. Mediastinum/Nodes: The trachea appears patent and is midline. Normal appearance of the esophagus. 11 mm right paratracheal lymph node is identified, image 49 of series 2. Previously 10 mm. Index pre-vascular node measures 9 mm, image 54 of series 2. Previously 11 mm. Index sub- carinal lymph node measures 12 mm, image 72 of series 2. Previously 13 mm. No enlarged axillary or supraclavicular lymph nodes. Lungs/Pleura: Moderate to advanced changes of centrilobular emphysema. Pleural calcifications overlying the right lung are again noted. Similar to previous exam. The index nodule within the right midlung Measures 11 mm, image 64 of series 7. Previously 10 mm. Index spiculated nodule in the right upper lobe Measures 1.2 cm, image 50 of series 7. Previously 1 cm. No new lesions identified. Upper Abdomen: Gallstones.  No acute abnormality noted. Musculoskeletal: Degenerative disc disease noted within the thoracic spine. No aggressive lytic or sclerotic bone lesions. IMPRESSION: 1. Stable to slight increase in size (on the order of 1-2 mm) of index nodules within the right lung. No significant change in the appearance of borderline mediastinal lymph nodes. 2. No new sites of disease identified. 3. Aortic Atherosclerosis (ICD10-I70.0) and Emphysema (ICD10-J43.9). Three vessel coronary artery calcifications noted. Electronically Signed   By: Kerby Moors M.D.   On: 10/16/2017 16:55    ASSESSMENT AND PLAN:  This is a very pleasant 76 years old white male with a stage IIIA non-small cell lung cancer, favoring adenocarcinoma.  The patient underwent a course of concurrent chemoradiation with weekly carboplatin and paclitaxel status post 7 cycles. The patient is currently on observation. He is feeling fine with no  specific complaints except for mild fatigue and shortness of breath with exertion. He had a repeat CT scan of the chest performed recently. Has a scan showed no evidence for disease progression.  I discussed the scan results with the patient and his wife I recommended for him to continue on observation with repeat CT scan of the chest in 3 months. The patient was advised to call immediately if he has any concerning symptoms in the interval. The patient voices understanding of current disease status and treatment options and is in agreement with the current care plan. All questions were answered. The patient knows to call the clinic with any problems, questions or concerns. We  can certainly see the patient much sooner if necessary.  Disclaimer: This note was dictated with voice recognition software. Similar sounding words can inadvertently be transcribed and may not be corrected upon review.

## 2017-10-18 NOTE — Telephone Encounter (Signed)
Scheduled appt per 12/6 los - Gave patient AVS and calender per los 

## 2017-11-30 ENCOUNTER — Inpatient Hospital Stay (HOSPITAL_COMMUNITY)
Admission: EM | Admit: 2017-11-30 | Discharge: 2017-11-30 | DRG: 054 | Disposition: A | Discharge status: Home / Self Care | Payer: Medicare Other | Attending: Family Medicine | Admitting: Family Medicine

## 2017-11-30 ENCOUNTER — Encounter (HOSPITAL_COMMUNITY): Payer: Self-pay | Admitting: Emergency Medicine

## 2017-11-30 ENCOUNTER — Emergency Department (HOSPITAL_COMMUNITY): Payer: Medicare Other

## 2017-11-30 ENCOUNTER — Other Ambulatory Visit: Payer: Self-pay

## 2017-11-30 ENCOUNTER — Telehealth: Payer: Self-pay | Admitting: Medical Oncology

## 2017-11-30 DIAGNOSIS — G911 Obstructive hydrocephalus: Secondary | ICD-10-CM | POA: Diagnosis not present

## 2017-11-30 DIAGNOSIS — Z8249 Family history of ischemic heart disease and other diseases of the circulatory system: Secondary | ICD-10-CM

## 2017-11-30 DIAGNOSIS — Z794 Long term (current) use of insulin: Secondary | ICD-10-CM

## 2017-11-30 DIAGNOSIS — G4733 Obstructive sleep apnea (adult) (pediatric): Secondary | ICD-10-CM | POA: Diagnosis not present

## 2017-11-30 DIAGNOSIS — G919 Hydrocephalus, unspecified: Secondary | ICD-10-CM

## 2017-11-30 DIAGNOSIS — C3411 Malignant neoplasm of upper lobe, right bronchus or lung: Secondary | ICD-10-CM | POA: Diagnosis present

## 2017-11-30 DIAGNOSIS — I110 Hypertensive heart disease with heart failure: Secondary | ICD-10-CM | POA: Diagnosis present

## 2017-11-30 DIAGNOSIS — I480 Paroxysmal atrial fibrillation: Secondary | ICD-10-CM | POA: Diagnosis present

## 2017-11-30 DIAGNOSIS — C3490 Malignant neoplasm of unspecified part of unspecified bronchus or lung: Secondary | ICD-10-CM

## 2017-11-30 DIAGNOSIS — Z8673 Personal history of transient ischemic attack (TIA), and cerebral infarction without residual deficits: Secondary | ICD-10-CM

## 2017-11-30 DIAGNOSIS — E782 Mixed hyperlipidemia: Secondary | ICD-10-CM | POA: Diagnosis present

## 2017-11-30 DIAGNOSIS — G936 Cerebral edema: Secondary | ICD-10-CM | POA: Diagnosis present

## 2017-11-30 DIAGNOSIS — E872 Acidosis, unspecified: Secondary | ICD-10-CM | POA: Diagnosis present

## 2017-11-30 DIAGNOSIS — K219 Gastro-esophageal reflux disease without esophagitis: Secondary | ICD-10-CM | POA: Diagnosis present

## 2017-11-30 DIAGNOSIS — Z7902 Long term (current) use of antithrombotics/antiplatelets: Secondary | ICD-10-CM

## 2017-11-30 DIAGNOSIS — Z87891 Personal history of nicotine dependence: Secondary | ICD-10-CM | POA: Diagnosis not present

## 2017-11-30 DIAGNOSIS — I429 Cardiomyopathy, unspecified: Secondary | ICD-10-CM | POA: Diagnosis not present

## 2017-11-30 DIAGNOSIS — Z9221 Personal history of antineoplastic chemotherapy: Secondary | ICD-10-CM

## 2017-11-30 DIAGNOSIS — G939 Disorder of brain, unspecified: Secondary | ICD-10-CM

## 2017-11-30 DIAGNOSIS — Z923 Personal history of irradiation: Secondary | ICD-10-CM

## 2017-11-30 DIAGNOSIS — Z9981 Dependence on supplemental oxygen: Secondary | ICD-10-CM

## 2017-11-30 DIAGNOSIS — F0390 Unspecified dementia without behavioral disturbance: Secondary | ICD-10-CM | POA: Diagnosis present

## 2017-11-30 DIAGNOSIS — E785 Hyperlipidemia, unspecified: Secondary | ICD-10-CM | POA: Diagnosis not present

## 2017-11-30 DIAGNOSIS — Z7982 Long term (current) use of aspirin: Secondary | ICD-10-CM

## 2017-11-30 DIAGNOSIS — I5042 Chronic combined systolic (congestive) and diastolic (congestive) heart failure: Secondary | ICD-10-CM | POA: Diagnosis present

## 2017-11-30 DIAGNOSIS — C7931 Secondary malignant neoplasm of brain: Secondary | ICD-10-CM | POA: Diagnosis present

## 2017-11-30 DIAGNOSIS — R531 Weakness: Secondary | ICD-10-CM | POA: Diagnosis not present

## 2017-11-30 DIAGNOSIS — Z96653 Presence of artificial knee joint, bilateral: Secondary | ICD-10-CM | POA: Diagnosis present

## 2017-11-30 DIAGNOSIS — G9389 Other specified disorders of brain: Secondary | ICD-10-CM | POA: Diagnosis present

## 2017-11-30 DIAGNOSIS — J449 Chronic obstructive pulmonary disease, unspecified: Secondary | ICD-10-CM | POA: Diagnosis present

## 2017-11-30 DIAGNOSIS — E1169 Type 2 diabetes mellitus with other specified complication: Secondary | ICD-10-CM | POA: Diagnosis not present

## 2017-11-30 DIAGNOSIS — R918 Other nonspecific abnormal finding of lung field: Secondary | ICD-10-CM | POA: Diagnosis present

## 2017-11-30 DIAGNOSIS — Z823 Family history of stroke: Secondary | ICD-10-CM

## 2017-11-30 DIAGNOSIS — Z86711 Personal history of pulmonary embolism: Secondary | ICD-10-CM

## 2017-11-30 LAB — URINALYSIS, ROUTINE W REFLEX MICROSCOPIC
Bilirubin Urine: NEGATIVE
Glucose, UA: NEGATIVE mg/dL
HGB URINE DIPSTICK: NEGATIVE
KETONES UR: NEGATIVE mg/dL
LEUKOCYTES UA: NEGATIVE
Nitrite: NEGATIVE
PROTEIN: NEGATIVE mg/dL
Specific Gravity, Urine: 1.008 (ref 1.005–1.030)
pH: 6 (ref 5.0–8.0)

## 2017-11-30 LAB — CBC
HEMATOCRIT: 33 % — AB (ref 39.0–52.0)
HEMOGLOBIN: 10.3 g/dL — AB (ref 13.0–17.0)
MCH: 28.5 pg (ref 26.0–34.0)
MCHC: 31.2 g/dL (ref 30.0–36.0)
MCV: 91.4 fL (ref 78.0–100.0)
Platelets: 364 10*3/uL (ref 150–400)
RBC: 3.61 MIL/uL — ABNORMAL LOW (ref 4.22–5.81)
RDW: 14.5 % (ref 11.5–15.5)
WBC: 10.2 10*3/uL (ref 4.0–10.5)

## 2017-11-30 LAB — LACTIC ACID, PLASMA
LACTIC ACID, VENOUS: 3.3 mmol/L — AB (ref 0.5–1.9)
Lactic Acid, Venous: 2.5 mmol/L (ref 0.5–1.9)

## 2017-11-30 LAB — DIFFERENTIAL
Basophils Absolute: 0 10*3/uL (ref 0.0–0.1)
Basophils Relative: 0 %
EOS ABS: 0.7 10*3/uL (ref 0.0–0.7)
EOS PCT: 6 %
LYMPHS PCT: 7 %
Lymphs Abs: 0.7 10*3/uL (ref 0.7–4.0)
MONO ABS: 0.5 10*3/uL (ref 0.1–1.0)
MONOS PCT: 5 %
Neutro Abs: 8.4 10*3/uL — ABNORMAL HIGH (ref 1.7–7.7)
Neutrophils Relative %: 82 %

## 2017-11-30 LAB — COMPREHENSIVE METABOLIC PANEL
ALK PHOS: 85 U/L (ref 38–126)
ALT: 10 U/L — ABNORMAL LOW (ref 17–63)
ANION GAP: 12 (ref 5–15)
AST: 17 U/L (ref 15–41)
Albumin: 4 g/dL (ref 3.5–5.0)
BUN: 25 mg/dL — ABNORMAL HIGH (ref 6–20)
CALCIUM: 9.3 mg/dL (ref 8.9–10.3)
CO2: 25 mmol/L (ref 22–32)
Chloride: 98 mmol/L — ABNORMAL LOW (ref 101–111)
Creatinine, Ser: 1.87 mg/dL — ABNORMAL HIGH (ref 0.61–1.24)
GFR calc Af Amer: 39 mL/min — ABNORMAL LOW (ref >60)
GFR, EST NON AFRICAN AMERICAN: 34 mL/min — AB (ref >60)
Glucose, Bld: 207 mg/dL — ABNORMAL HIGH (ref 65–99)
POTASSIUM: 4.1 mmol/L (ref 3.5–5.1)
Sodium: 135 mmol/L (ref 135–145)
TOTAL PROTEIN: 7.3 g/dL (ref 6.5–8.1)
Total Bilirubin: 0.4 mg/dL (ref 0.3–1.2)

## 2017-11-30 LAB — PROTIME-INR
INR: 1.07
Prothrombin Time: 13.8 seconds (ref 11.4–15.2)

## 2017-11-30 LAB — TROPONIN I

## 2017-11-30 LAB — APTT: aPTT: 31 seconds (ref 24–36)

## 2017-11-30 MED ORDER — SODIUM CHLORIDE 0.9 % IV SOLN: INTRAVENOUS | Status: DC

## 2017-11-30 MED ORDER — DEXAMETHASONE 2 MG PO TABS: 2.0000 mg | ORAL_TABLET | Freq: Three times a day (TID) | ORAL | 0 refills | Status: AC

## 2017-11-30 MED ORDER — ALBUTEROL SULFATE (2.5 MG/3ML) 0.083% IN NEBU
5.0000 mg | INHALATION_SOLUTION | Freq: Once | RESPIRATORY_TRACT | Status: AC
  Administered 2017-11-30: 5 mg via RESPIRATORY_TRACT
  Filled 2017-11-30: qty 6

## 2017-11-30 MED ORDER — SODIUM CHLORIDE 0.9 % IV BOLUS (SEPSIS): 500.0000 mL | Freq: Once | INTRAVENOUS | Status: DC

## 2017-11-30 MED ORDER — IPRATROPIUM-ALBUTEROL 0.5-2.5 (3) MG/3ML IN SOLN
3.0000 mL | Freq: Once | RESPIRATORY_TRACT | Status: AC
  Administered 2017-11-30: 3 mL via RESPIRATORY_TRACT
  Filled 2017-11-30: qty 3

## 2017-11-30 MED ORDER — DEXAMETHASONE SODIUM PHOSPHATE 4 MG/ML IJ SOLN
4.0000 mg | Freq: Four times a day (QID) | INTRAMUSCULAR | Status: DC
  Administered 2017-11-30: 4 mg via INTRAVENOUS
  Filled 2017-11-30: qty 1

## 2017-11-30 MED ORDER — SODIUM CHLORIDE 0.9 % IV BOLUS (SEPSIS)
1000.0000 mL | Freq: Once | INTRAVENOUS | Status: AC
  Administered 2017-11-30: 1000 mL via INTRAVENOUS

## 2017-11-30 MED ORDER — SODIUM CHLORIDE 0.9 % IV BOLUS (SEPSIS)
500.0000 mL | Freq: Once | INTRAVENOUS | Status: AC
Start: 2017-11-30 — End: 2017-11-30
  Administered 2017-11-30: 500 mL via INTRAVENOUS

## 2017-11-30 NOTE — ED Notes (Signed)
To CT via stretcher   To Rad for CXR

## 2017-11-30 NOTE — Consult Note (Addendum)
Medical Consultation   Rodney Orozco  ATF:[id number]  DOB: November 06, 1941  DOA: 11/30/2017  PCP: [name], DO   Outpatient Specialists:   Julien Nordmann (oncology)   Requesting physician: Thurnell Garbe  Reason for consultation: Admission for weakness and brain mass   History of Present Illness: Rodney Orozco is an 77 y.o. male with a history of asthma, dementia, paroxysmal atrial fibrillation, type 2 diabetes, hypertension, lung cancer (stage III non-small cell lung cancer) in the right upper lobe on chemo and radiation therapy.  Patient has 2 days of worsening generalized weakness that has been gradually worsening.  No palliating or provoking factors.  Patient has been ambulating with a walker which helps with his unsteadiness.  Additionally, the patient's wife has noted that the patient has become more forgetful and unable to remember events.  She noted this at the last office visit with his oncologist, Dr. Julien Nordmann, on 10/18/17.  The patient's wife contacted Dr. Earlie Server this morning regarding the patient's symptoms, and they were directed to come to the emergency department for evaluation.    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   Review of Systems:  ROS As per HPI otherwise 10 point review of systems negative.     Past Medical History: Past Medical History:  Diagnosis Date  . AAA (abdominal aortic aneurysm) ([place]) 2017  . Asthma   . Cardiomyopathy ([place])   . Dementia   . Dyslipidemia   . GI bleed   . Goals of care, counseling/discussion 04/20/2017  . HCAP (healthcare-associated pneumonia) 12/2013   01/06/2014  . Hypertension   . Mixed hyperlipidemia   . On home oxygen therapy    2L during the day  . OSA on CPAP    actually BIPAP  . Osteoarthritis   . Paroxysmal atrial fibrillation  (HCC)    takes Warfarin  . Pneumonia December 2014   Had hemoptysis and admitted at [facility].  . Pulmonary embolism ([place]) 12/2014   S/P knee replacement/notes 08/31/2016  . TIA (transient ischemic attack)   . Type II diabetes mellitus ([place])   . Vitamin D deficiency     Past Surgical History: Past Surgical History:  Procedure Laterality Date  . ABDOMINAL AORTIC ANEURYSM REPAIR  08/31/2016  . ABDOMINAL AORTIC ENDOVASCULAR STENT GRAFT N/A 08/31/2016   Procedure: ABDOMINAL AORTIC ENDOVASCULAR STENT GRAFT and Open Exposure left common femoral artery;  Surgeon: Angelia Mould, MD;  Location: [facility] OR;  Service: Vascular;  Laterality: N/A;  . ABDOMINAL SURGERY  1999   "for aneurysm"  . ABDOMINAL SURGERY     bleeding PUD in setting of aspirin powders. REMOTE. also H.pylori positive per epic notes  . BACK SURGERY    . CARDIAC CATHETERIZATION N/A 12/05/2016   Procedure: Right/Left Heart Cath and Coronary Angiography;  Surgeon: Jettie Booze, MD;  Location: [place] [facility] LAB;  Service: Cardiovascular;  Laterality: N/A;  . CARDIAC CATHETERIZATION N/A 12/05/2016   Procedure: Coronary Stent Intervention;  Surgeon: Jettie Booze, MD;  Location: [place] [facility];  Service: Cardiovascular;  Laterality: N/A;  . COLONOSCOPY N/A 01/28/2017   Procedure: COLONOSCOPY;  Surgeon: Rogene Houston, MD;  Location: [facility];  Service: Endoscopy;  Laterality: N/A;  . COLONOSCOPY WITH ESOPHAGOGASTRODUODENOSCOPY (  EGD)  2004   Dr. Amedeo Plenty: antral gastritis, hypersplastic rectal polyp, few scattered diverticula  . ESOPHAGOGASTRODUODENOSCOPY N/A 01/27/2017   Procedure: ESOPHAGOGASTRODUODENOSCOPY (EGD);  Surgeon: Rogene Houston, MD;  Location: [facility];  Service: Endoscopy;  Laterality: N/A;  . IR FLUORO GUIDE PORT INSERTION LEFT  05/23/2017  . IR US GUIDE VASC ACCESS LEFT  05/23/2017  . JOINT REPLACEMENT    . [place]  . TOTAL KNEE ARTHROPLASTY Bilateral Feb 2016 - Dec  2016  . VIDEO BRONCHOSCOPY WITH ENDOBRONCHIAL ULTRASOUND N/A 04/11/2017   Procedure: VIDEO BRONCHOSCOPY WITH ENDOBRONCHIAL ULTRASOUND;  Surgeon: Ivin Poot, MD;  Location: [employer];  Service: Thoracic;  Laterality: N/A;     Allergies:   Allergies  Allergen Reactions  . Codeine Other (See Comments)    Hallucinations  . Tramadol Dermatitis  . Warfarin And Related     Rectal bleeding   . Lipitor [Atorvastatin] Other (See Comments)    Joint pain  . Penicillins Rash    HIves/rash Has patient had a PCN reaction causing immediate rash, facial/tongue/throat swelling, SOB or lightheadedness with hypotension: Yes Has patient had a PCN reaction causing severe rash involving mucus membranes or skin necrosis: No Has patient had a PCN reaction that required hospitalization No Has patient had a PCN reaction occurring within the last 10 years: No If all of the above answers are "NO", then may proceed with Cephalosporin use.      Social History:  reports that he quit smoking about 4 years ago. His smoking use included cigarettes. He has a 100.00 pack-year smoking history. he has never used smokeless tobacco. He reports that he does not drink alcohol or use drugs.   Family History: Family History  Problem Relation Age of Onset  . Dementia Mother   . Stroke Mother   . Heart attack Father    Physical Exam: Vitals:   11/30/17 1415 11/30/17 1514 11/30/17 1530 11/30/17 1600  BP:  128/72 137/70 (!) 123/57  Pulse: 79 80 82 84  Resp: (!) 21 14 19 17   Temp:      TempSrc:      SpO2: 100% 97% 98% 95%  Weight:      Height:        Constitutional: Elderly Caucasian male. Alert and awake, oriented x3, not in any acute distress. Eyes: PERLA, EOMI, irises appear normal, anicteric sclera,  ENMT: external ears and nose appear normal, Lips appears normal, oropharynx mucosa, tongue, posterior pharynx appear normal  Neck: neck appears normal, no masses, normal ROM, no thyromegaly, no JVD  CVS:  S1-S2 clear, no murmur rubs or gallops, no LE edema, normal pedal pulses  Respiratory:  clear to auscultation bilaterally, no wheezing, rales or rhonchi. Respiratory effort normal. No accessory muscle use.  Abdomen: soft nontender, nondistended, normal bowel sounds, no hepatosplenomegaly, no hernias  Musculoskeletal: : no cyanosis, clubbing or edema noted bilaterally Neuro: Cranial nerves II-XII intact, strength, sensation, reflexes Psych: judgement and insight appear normal, stable mood and affect, mental status Skin: no rashes or lesions or ulcers, no induration or nodules     Data reviewed:  I have personally reviewed following labs and imaging studies Labs:  CBC: Recent Labs  Lab 11/30/17 1140  WBC 10.2  NEUTROABS 8.4*  HGB 10.3*  HCT 33.0*  MCV 91.4  PLT 914    Basic Metabolic Panel: Recent Labs  Lab 11/30/17 1140  NA 135  K 4.1  CL 98*  CO2 25  GLUCOSE 207*  BUN 25*  CREATININE 1.87*  CALCIUM 9.3   GFR Estimated Creatinine Clearance: 41.5 mL/min (A) (by C-G formula based on SCr of 1.87 mg/dL (H)). Liver Function Tests: Recent Labs  Lab 11/30/17 1140  AST 17  ALT 10*  ALKPHOS 85  BILITOT 0.4  PROT 7.3  ALBUMIN 4.0   No results for input(s): LIPASE, AMYLASE in the last 168 hours. No results for input(s): AMMONIA in the last 168 hours. Coagulation profile Recent Labs  Lab 11/30/17 1140  INR 1.07    Cardiac Enzymes: Recent Labs  Lab 11/30/17 1235  TROPONINI <0.03   BNP: Invalid input(s): POCBNP CBG: No results for input(s): GLUCAP in the last 168 hours. D-Dimer No results for input(s): DDIMER in the last 72 hours. Hgb A1c No results for input(s): HGBA1C in the last 72 hours. Lipid Profile No results for input(s): CHOL, HDL, LDLCALC, TRIG, CHOLHDL, LDLDIRECT in the last 72 hours. Thyroid function studies No results for input(s): TSH, T4TOTAL, T3FREE, THYROIDAB in the last 72 hours.  Invalid input(s): FREET3 Anemia work up No results for  input(s): VITAMINB12, FOLATE, FERRITIN, TIBC, IRON, RETICCTPCT in the last 72 hours. Urinalysis    Component Value Date/Time   COLORURINE STRAW (A) 11/30/2017 1236   APPEARANCEUR CLEAR 11/30/2017 1236   LABSPEC 1.008 11/30/2017 1236   PHURINE 6.0 11/30/2017 1236   GLUCOSEU NEGATIVE 11/30/2017 1236   HGBUR NEGATIVE 11/30/2017 1236   BILIRUBINUR NEGATIVE 11/30/2017 1236   KETONESUR NEGATIVE 11/30/2017 1236   PROTEINUR NEGATIVE 11/30/2017 1236   NITRITE NEGATIVE 11/30/2017 1236   [place] 11/30/2017 1236     Microbiology No results found for this or any previous visit (from the past 240 hour(s)).     Inpatient Medications:   Scheduled Meds: . dexamethasone  4 mg Intravenous Q6H   Continuous Infusions: . sodium chloride    . sodium chloride    . sodium chloride       Radiological Exams on Admission: Dg Chest 2 View  Result Date: 11/30/2017 CLINICAL DATA:  Weakness. EXAM: CHEST  2 VIEW COMPARISON:  Radiograph of Apr 11, 2017. CT scan of October 16, 2017. FINDINGS: Stable cardiomediastinal silhouette. Interval placement of left internal jugular Port-A-Cath with distal tip in expected position of the SVC. No pneumothorax is noted. Mild bibasilar subsegmental atelectasis is noted. Calcified pleural plaques are noted on the right. Bony thorax is unremarkable. No significant pleural effusion is noted. IMPRESSION: Mild bibasilar subsegmental atelectasis. Calcified pleural plaques are noted on the right suggesting asbestos exposure. Electronically Signed   By: Marijo Conception, M.D.   On: 11/30/2017 13:44   Ct Head Wo Contrast  Result Date: 11/30/2017 CLINICAL DATA:  Difficulty walking for 2 weeks, leg weakness. Assess for stroke. History of lung cancer, stroke, dementia, hypertension, hyperlipidemia, atrial fibrillation, diabetes. EXAM: CT HEAD WITHOUT CONTRAST TECHNIQUE: Contiguous axial images were obtained from the base of the skull through the vertex without intravenous  contrast. COMPARISON:  MRI of the head April 22, 2017 FINDINGS: BRAIN: 4.4 x 3.4 centimeter LEFT cerebellar mass with 2.9 x 2.8 centimeter central density. Surrounding vasogenic edema with LEFT-to-RIGHT cerebellar midline shift and partially effaced 4th ventricle. Anterior recess of 3rd ventricle is 11 mm, previously 10 mm. Moderate ventriculomegaly, similar to slightly worse. Old small cerebellar, bilateral basal ganglia and bilateral thalamus lacunar infarcts. Patchy supratentorial white matter hypodensities no acute large vascular territory infarcts. No abnormal extra-axial fluid collections. Basal cisterns are patent. VASCULAR: Mild calcific atherosclerosis of the carotid siphons.Punctate calcification along  course of RIGHT anterior cerebral artery. SKULL: No skull fracture. Multiple vertex calvarial vascular lakes. No significant scalp soft tissue swelling. SINUSES/ORBITS: The mastoid air-cells and included paranasal sinuses are well-aerated.The included ocular globes and orbital contents are non-suspicious. OTHER: Patient is edentulous. IMPRESSION: 1. 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 Differential diagnosis includes subacute hematoma or, less likely metastasis. Recommend MRI of the brain with and without contrast for further characterization. 2. 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 3. Critical Value/emergent results were called by telephone at the time of interpretation on 11/30/2017 at 1:35 pm to Dr. Francine Graven , who verbally acknowledged these results. Electronically Signed   By: Elon Alas M.D.   On: 11/30/2017 13:38   Mr Brain Wo Contrast  Result Date: 11/30/2017 CLINICAL DATA:  Unsteady gait, altered mental status. Follow-up LEFT cerebellar mass. History of lung cancer, stroke, dementia, hypertension, hyperlipidemia, atrial fibrillation, diabetes. EXAM: MRI HEAD WITHOUT  CONTRAST TECHNIQUE: Multiplanar, multiecho pulse sequences of the brain and surrounding structures were obtained without intravenous contrast. Due to low GFR, contrast not administered. COMPARISON:  CT HEAD November 30, 2017 at 1312 hours. MRI of the head April 22, 2017. FINDINGS: Multiple sequences are moderately or moderate to severely motion degraded. BRAIN: Complex cystic 4.6 x 3.2 x 2.8 cm (transverse by AP by CC) LEFT cerebellar mass with solid central component and minimal susceptibility artifact inferior margin. No T1 shortening. Extensive surrounding vasogenic edema, LEFT-to-RIGHT cerebellar midline shift. Superior and inferior cerebellar herniation. Partial effacement of fourth ventricle with early suspected obstructive hydrocephalus. No reduced diffusion to suggest acute ischemia or hypercellular tumor. Old bilateral basal ganglia, bilateral thalamus and RIGHT cerebellar small infarcts. Known LEFT cerebellar infarcts obscured by mass. Patchy supratentorial white matter FLAIR T2 hyperintensities. No supratentorial midline shift. No abnormal extra-axial fluid collections. VASCULAR: Dolichoectatic major intracranial vascular flow voids present at skull base. SKULL AND UPPER CERVICAL SPINE: No abnormal sellar expansion. No suspicious calvarial bone marrow signal. Craniocervical junction maintained. SINUSES/ORBITS: Included paranasal sinuses are well aerated. Trace LEFT mastoid effusion. The included ocular globes and orbital contents are non-suspicious. OTHER: None. IMPRESSION: 1. Motion degraded examination. Limited noncontrast assessment due to low GFR. 2. Complex 4.6 x 3.2 x 2.8 LEFT cerebellar mass with imaging characteristics of metastasis, less likely hematoma. 3. 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 4. Moderate chronic small vessel ischemic disease and multiple old small vessel infarcts. 5. Recommend follow-up postcontrast imaging when patient better able to remain still and improved GFR.  Electronically Signed   By: Elon Alas M.D.   On: 11/30/2017 15:24   I briefly reviewed the patient's records, including prior MRI scans of the patient's head, previous notes from Dr. Julien Nordmann, as well as records from his prior hospitalizations.  Impression/Recommendations Active Problems:   DM type 2 with diabetic dyslipidemia (HCC)   Lung mass   Brain mass   Vasogenic edema (HCC)   Weakness   Lactic acid acidosis   Hydrocephalus   1.  Weakness 2.  Brain mass 3.  Vasogenic edema 4.  Hydrocephalus 5.  Lactic acidosis 6.  Lung mass 7.  Type 2 diabetes  Recommendations: I spent approximately 30 minutes reviewing with the patient and his wife the test results and the significance of the brain mass causing the midline shift and vasogenic edema and obstructive hydrocephalus.  I discussed with them the recommendations to go to [facility] for evaluation by the neurosurgeon  and possible surgery to correct the problems.  At this point, the patient does not wish for any intervention to occur: He states that he does not wish to have any further chemotherapy or radiation therapy.  I reviewed with the patient and his wife that without correction, the hydrocephalus would continue to increase and that irreversible damage can occur, as well as death.  The patient continued to wish to be able to return home without intervention.  He does have home health care and, from my discussion with the patient's wife, a hospice nurse will be visiting them once a week.  With the vasogenic edema, I did recommend starting steroids, which may help improve some of the weakness and instability that the patient is experiencing.  Dr. Thurnell Garbe spoke with neurosurgery again regarding the patient and he recommended 2 mg 3 times daily.  The patient and his wife were amenable to this action.  In regards to the patient's type 2 diabetes: Patient is currently on metformin and Novolin N.  I would recommend increasing  his Novolin N from 10 units twice daily to 15 to cover the increased dose of steroids. I recommended him following up with his primary care physician beginning of next week -this will ensure that any hyperglycemia caused by his use of steroids will be corrected early.  The patient did have a mild lactic acidosis when he first arrived.  Patient has received some IV fluids.  Recheck of his lactic acid was increased, however this was after the patient received a nebulizer treatment.  Nebulizer treatments are known to increase the patient's lactic acid level.  The patient is oxygenating well and is without cardiopulmonary distress.  Additionally, the patient does wish to return home and I do not feel that this would impede his desires.  Lastly, recommended that if he begins to develop any focal deficits, if they were to change their minds about care, or his symptoms were to acutely worsen, that they should return for continued workup.  Thank you for allowing me to participate in the care of this patient.   Time Spent: Am[place], DO [facility] 11/30/2017, 5:11 PM

## 2017-11-30 NOTE — ED Notes (Signed)
Orthostatics  Lying :  118/73, 78,18,96 per cent O2 /2L  Sitting:  117/60, 83, 20, 96 percent O2/2L   Standing 94/48, 81, 20, 96 percent O2/2L

## 2017-11-30 NOTE — Telephone Encounter (Signed)
-  cough , balance issue-Pt recently started on bactrim for new cough ( Dr Butler jan 10 th). Wife called today to report that  home health nurse saw pt yesterday and told wife pt may have had a stroke because he was unsteady on his feet. Wife then called Dr Melina Copa who told her that Rodney Orozco may have had a stroke or has brain mets and to call [facility].   So wife  called today to report this and stated on jan 5th "Rodney Orozco slept like he was in a coma, he told me his head is about to bust. It was like he was doped up. The next day he was unsteady on his feet so he started walking with a cane and he almost fell so he started using a walker.  "Per Dr Julien Nordmann I instructed wife to take pt to ED for his symptoms. She said she will take him to [facility].

## 2017-11-30 NOTE — ED Notes (Signed)
Date and time results received: 11/30/17 1:35 PM (use smartphrase ".now" to insert current time)  Test: Lactic Critical Value: 2.5  Name of Provider Notified: Thurnell Garbe  Orders Received? Or Actions Taken?: Orders Received - See Orders for details

## 2017-11-30 NOTE — ED Provider Notes (Signed)
[facility] EMERGENCY DEPARTMENT Provider Note   CSN: [id number] Arrival date & time: 11/30/17  1101     History   Chief Complaint Chief Complaint  Patient presents with  . Cerebrovascular Accident    HPI CRIMSON BEER is a 77 y.o. male.  The history is provided by the patient and the spouse. The history is limited by the condition of the patient (Hx dementia).  Cerebrovascular Accident   Pt was seen at 1235. Per pt and his wife: Pt with gradual onset and persistence of constantly "off balance" when walking since 11/17/2017. Pt's wife states pt "slept nearly all day" then woke up with a headache and walking off balance. Pt has now been walking with a walker due to his symptoms. Pt also has had a cough for the past 2 weeks. Denies fevers, no sore throat, no rash, no CP/palpitations, no abd pain, no N/V/D, no focal motor weakness, no tingling/numbness in extremities.   Past Medical History:  Diagnosis Date  . AAA (abdominal aortic aneurysm) ([place]) 2017  . Asthma   . Cardiomyopathy ([place])   . Dementia   . Dyslipidemia   . GI bleed   . Goals of care, counseling/discussion 04/20/2017  . HCAP (healthcare-associated pneumonia) 12/2013   01/06/2014  . Hypertension   . Mixed hyperlipidemia   . On home oxygen therapy    2L during the day  . OSA on CPAP    actually BIPAP  . Osteoarthritis   . Paroxysmal atrial fibrillation (HCC)    takes Warfarin  . Pneumonia December 2014   Had hemoptysis and admitted at [facility].  . Pulmonary embolism ([place]) 12/2014   S/P knee replacement/notes 08/31/2016  . TIA (transient ischemic attack)   . Type II diabetes mellitus ([place])   . Vitamin D deficiency     Patient Active Problem List   Diagnosis Date Noted  . Antineoplastic chemotherapy induced anemia 05/24/2017  . Goals of care, counseling/discussion 04/20/2017  . Encounter for antineoplastic chemotherapy 04/20/2017  . Microcytic anemia 04/20/2017  . Adenocarcinoma of right lung,  stage 3 ([place]) 04/19/2017  . Lung mass 04/12/2017  . Carotid artery disease ([place]) 02/28/2017  . CAD S/P percutaneous coronary angioplasty 02/08/2017  . Transfusion-dependent anemia   . GI bleed 01/24/2017  . Cardiomyopathy ([place])   . Elevated troponin I level   . Acute coronary syndrome ([place])   . Chest pain 12/03/2016  . Palpitations 12/03/2016  . Chronic combined systolic and diastolic CHF (congestive heart failure) ([place]) 12/03/2016  . AKI (acute kidney injury) ([place]) vs CKD 12/03/2016  . AAA (abdominal aortic aneurysm) [facility]) s/p repair in oct 2017 by dr Scot Dock 08/31/2016  . Long-term (current) use of anticoagulants 08/25/2016  . Central sleep apnea 08/08/2016  . COPD GOLD III 07/21/2016  . Paroxysmal atrial fibrillation ([place]) 07/12/2016  . Left ventricular dysfunction 07/12/2016  . Acute combined systolic and diastolic heart failure ([place]) 07/12/2016  . Cognitive changes 04/25/2016  . Gastroesophageal reflux disease without esophagitis 07/29/2015  . Morbid obesity due to excess calories ([place]) 05/13/2015  . Primary osteoarthritis of right knee 02/09/2015  . Pulmonary embolism without acute cor pulmonale ([place]) 2016 post op treated for a year 02/09/2015  . Postoperative pulmonary embolism ([place]) 01/21/2015  . DM type 2 with diabetic dyslipidemia ([place]) 08/18/2014  . Other and unspecified hyperlipidemia 04/27/2014  . Influenza B 01/07/2014  . Streptococcus pneumoniae pneumonia ([place]) 01/06/2014  . Acute respiratory failure with hypoxia ([place]) 01/06/2014  . COPD with acute exacerbation (  [place]) 01/06/2014  . Healthcare-associated pneumonia 01/06/2014  . Diabetes mellitus without complication ([place])   . Hypertension   . Dyslipidemia     Past Surgical History:  Procedure Laterality Date  . ABDOMINAL AORTIC ANEURYSM REPAIR  08/31/2016  . ABDOMINAL AORTIC ENDOVASCULAR STENT GRAFT N/A 08/31/2016   Procedure: ABDOMINAL AORTIC ENDOVASCULAR STENT GRAFT and Open Exposure left common femoral  artery;  Surgeon: Angelia Mould, MD;  Location: [facility] OR;  Service: Vascular;  Laterality: N/A;  . ABDOMINAL SURGERY  1999   "for aneurysm"  . ABDOMINAL SURGERY     bleeding PUD in setting of aspirin powders. REMOTE. also H.pylori positive per epic notes  . BACK SURGERY    . CARDIAC CATHETERIZATION N/A 12/05/2016   Procedure: Right/Left Heart Cath and Coronary Angiography;  Surgeon: Jettie Booze, MD;  Location: [place] CV LAB;  Service: Cardiovascular;  Laterality: N/A;  . CARDIAC CATHETERIZATION N/A 12/05/2016   Procedure: Coronary Stent Intervention;  Surgeon: Jettie Booze, MD;  Location: [place] CV LAB;  Service: Cardiovascular;  Laterality: N/A;  . COLONOSCOPY N/A 01/28/2017   Procedure: COLONOSCOPY;  Surgeon: Rogene Houston, MD;  Location[facility] END[facility] SUITE;  Service: Endoscopy;  Laterality: N/A;  . COLONOSCOPY WITH ESOPHAGOGASTRODUODENOSCOPY (EGD)  2004   Dr. Amedeo Plenty: antral gastritis, hypersplastic rectal polyp, few scattered diverticula  . ESOPHAGOGASTRODUODENOSCOPY N/A 01/27/2017   Procedure: ESOPHAGOGASTRODUODENOSCOPY (EGD);  Surgeon: Rogene Houston, MD;  Location: [facility] END[facility] SUITE;  Service: Endoscopy;  Laterality: N/A;  . IR FLUORO GUIDE PORT INSERTION LEFT  05/23/2017  . IR US GUIDE VASC ACCESS LEFT  05/23/2017  . JOINT REPLACEMENT    . [place]  . TOTAL KNEE ARTHROPLASTY Bilateral Feb 2016 - Dec 2016  . VIDEO BRONCHOSCOPY WITH ENDOBRONCHIAL ULTRASOUND N/A 04/11/2017   Procedure: VIDEO BRONCHOSCOPY WITH ENDOBRONCHIAL ULTRASOUND;  Surgeon: Ivin Poot, MD;  Location: [place];  Service: Thoracic;  Laterality: N/A;       Home Medications    Prior to Admission medications   Medication Sig Start Date End Date Taking? Authorizing Provider  acetaminophen (TYLENOL) 500 MG tablet Take 1,000 mg by mouth 2 (two) times daily as needed for headache.    [provider]  amLODipine (NORVASC) 5 MG tablet Take 1 tablet (5 mg total) by mouth  daily. 12/07/16   Arbutus Leas, NP  aspirin EC 81 MG tablet Take 1 tablet (81 mg total) by mouth daily. 02/08/17   Erlene Quan, PA-C  clopidogrel (PLAVIX) 75 MG tablet Take 1 tablet (75 mg total) by mouth daily. 12/06/16   Arbutus Leas, NP  donepezil (ARICEPT) 10 MG tablet TAKE ONE TABLET BY MOUTH AT BEDTIME 07/04/17   Melvenia Beam, MD  famotidine (PEPCID) 20 MG tablet Take 20 mg by mouth 2 (two) times daily.  06/30/16   [provider]  flintstones complete (FLINTSTONES) 60 MG chewable tablet Chew 1 tablet by mouth 2 (two) times daily. 03/13/17   Rehman, Mechele Dawley, MD  furosemide (LASIX) 40 MG tablet Take 1 tablet (40 mg total) by mouth daily. 01/29/17   Johnson, Clanford L, MD  losartan (COZAAR) 100 MG tablet Take 100 mg by mouth daily. 02/25/16   [provider]  metFORMIN (GLUCOPHAGE) 1000 MG tablet Take 1,000 mg by mouth 2 (two) times daily.     [provider]  metoprolol tartrate (LOPRESSOR) 25 MG tablet Take 25 mg by mouth 2 (two) times daily.  06/30/16   [provider]  nitroGLYCERIN (NITROSTAT) 0.4 MG SL tablet Place 1 tablet (0.4 mg total) under the tongue every 5 (five) minutes as needed for chest pain. MAX 3 dose 12/08/16   Lorretta Harp, MD  NOVOLIN N RELION 100 UNIT/ML injection Inject 0.05 mLs (5 Units total) into the skin 2 (two) times daily. Patient taking differently: Inject 10 Units into the skin 2 (two) times daily.  01/29/17   Johnson, Clanford L, MD  oxyCODONE (OXY IR/ROXICODONE) 5 MG immediate release tablet Take 5 mg by mouth every 6 (six) hours as needed. 06/04/17   [provider]  OXYGEN Inhale 2 L into the lungs at bedtime. 2lpm 24/7  DME- [facility] Romans     [provider]  prochlorperazine (COMPAZINE) 10 MG tablet Take 1 tablet (10 mg total) by mouth every 6 (six) hours as needed for nausea or vomiting. 04/19/17   Curt Bears, MD  VICTOZA 18 MG/3ML SOPN Inject 1.2 mg into the skin every morning.  11/15/16   [provider]    Family History Family History  Problem Relation Age of Onset  . Dementia Mother   . Stroke Mother   . Heart attack Father     Social History Social History   Tobacco Use  . Smoking status: Former Smoker    Packs/day: 2.00    Years: 50.00    Pack years: 100.00    Types: Cigarettes    Last attempt to quit: 11/07/2013    Years since quitting: 4.0  . Smokeless tobacco: Never Used  Substance Use Topics  . Alcohol use: No    Alcohol/week: 0.0 oz  . Drug use: No    Comment: "I do not inhale"     Allergies   Codeine; Tramadol; Warfarin and related; Lipitor [atorvastatin]; and Penicillins   Review of Systems Review of Systems  Unable to perform ROS: Dementia     Physical Exam Updated Vital Signs BP 125/75 (BP Location: Right Arm)   Pulse 80   Temp 98.4 F (36.9 C) (Oral)   Resp 16   Ht 5' 10.5" (1.791 m)   Wt 100.2 kg (221 lb)   SpO2 93%   BMI 31.26 kg/m    BP (!) 123/57   Pulse 84   Temp 98.4 F (36.9 C)   Resp 17   Ht 5' 10.5" (1.791 m)   Wt 100.2 kg (221 lb)   SpO2 95%   BMI 31.26 kg/m     Author: Quinn Axe, RN Service: - Author Type: Registered Nurse  Filed: 11/30/2017 1:13 PM Date of Service: 11/30/2017 1:11 PM Status: Signed  Editor: Quinn Axe, RN (Registered Nurse)    Orthostatics  Lying :  118/73, [phone] per cent O2 /2L  Sitting:  117/60, 83, 20, 96 percent O2/2L   Standing 94/48, 81, 20, 96 percent O2/2L       Physical Exam 1240: Physical examination:  Nursing notes reviewed; Vital signs and O2 SAT reviewed;  Constitutional: Well developed, Well nourished, Well hydrated, In no acute distress; Head:  Normocephalic, atraumatic; Eyes: EOMI, PERRL, No scleral icterus; ENMT: TM's clear bilat. +edemetous nasal turbinates bilat with clear rhinorrhea. Mouth and pharynx normal, Mucous membranes moist; Neck: Supple, Full range of motion, No lymphadenopathy; Cardiovascular: Regular rate and rhythm, No  gallop; Respiratory: Breath sounds diminished & equal bilaterally, scattered wheezes. No audible wheezing. Speaking full sentences with ease, Normal respiratory effort/excursion; Chest: Nontender, Movement normal; Abdomen: Soft, Nontender, Nondistended, Normal bowel sounds; Genitourinary: No CVA  tenderness; Extremities: Pulses normal, No tenderness, No edema, No calf edema or asymmetry.; Neuro: Awake, alert, mildly confused per hx dementia. Major CN grossly intact. Speech clear.  No facial droop.  No nystagmus. Grips equal. Strength 5/5 equal bilat UE's and LE's.  DTR 2/4 equal bilat UE's and LE's.  No gross sensory deficits.  Normal cerebellar testing bilat UE's (finger-nose) and LE's (heel-shin)..; Skin: Color normal, Warm, Dry.   ED Treatments / Results  Labs (all labs ordered are listed, but only abnormal results are displayed)   EKG  EKG Interpretation  Date/Time:  Friday November 30 2017 11:32:12 EST Ventricular Rate:  78 PR Interval:  158 QRS Duration: 118 QT Interval:  424 QTC Calculation: 483 R Axis:   -15 Text Interpretation:  Normal sinus rhythm with sinus arrhythmia Left ventricular hypertrophy with QRS widening Nonspecific ST and T wave abnormality Prolonged QT When compared with ECG of 04/06/2017 Nonspecific ST and T wave abnormality is now Present QT has lengthened Confirmed by Francine Graven [id number]) on 11/30/2017 12:45:12 PM       Radiology   Procedures Procedures (including critical care time)  Medications Ordered in ED Medications - No data to display   Initial Impression / Assessment and Plan / ED Course  I have reviewed the triage vital signs and the nursing notes.  Pertinent labs & imaging results that were available during my care of the patient were reviewed by me and considered in my medical decision making (see chart for details).  MDM Reviewed: previous chart, nursing note and vitals Reviewed previous: labs and ECG Interpretation: labs, ECG, x-ray  and CT scan    Results for orders placed or performed during the hospital encounter of 11/30/17  Protime-INR  Result Value Ref Range   Prothrombin Time 13.8 11.4 - 15.2 seconds   INR 1.07   APTT  Result Value Ref Range   aPTT 31 24 - 36 seconds  CBC  Result Value Ref Range   WBC 10.2 4.0 - 10.5 K/uL   RBC 3.61 (L) 4.22 - 5.81 MIL/uL   Hemoglobin 10.3 (L) 13.0 - 17.0 g/dL   HCT 33.0 (L) 39.0 - 52.0 %   MCV 91.4 78.0 - 100.0 fL   MCH 28.5 26.0 - 34.0 pg   MCHC 31.2 30.0 - 36.0 g/dL   RDW 14.5 11.5 - 15.5 %   Platelets 364 150 - 400 K/uL  Differential  Result Value Ref Range   Neutrophils Relative % 82 %   Neutro Abs 8.4 (H) 1.7 - 7.7 K/uL   Lymphocytes Relative 7 %   Lymphs Abs 0.7 0.7 - 4.0 K/uL   Monocytes Relative 5 %   Monocytes Absolute 0.5 0.1 - 1.0 K/uL   Eosinophils Relative 6 %   Eosinophils Absolute 0.7 0.0 - 0.7 K/uL   Basophils Relative 0 %   Basophils Absolute 0.0 0.0 - 0.1 K/uL  Comprehensive metabolic panel  Result Value Ref Range   Sodium 135 135 - 145 mmol/L   Potassium 4.1 3.5 - 5.1 mmol/L   Chloride 98 (L) 101 - 111 mmol/L   CO2 25 22 - 32 mmol/L   Glucose, Bld 207 (H) 65 - 99 mg/dL   BUN 25 (H) 6 - 20 mg/dL   Creatinine, Ser 1.87 (H) 0.61 - 1.24 mg/dL   Calcium 9.3 8.9 - 10.3 mg/dL   Total Protein 7.3 6.5 - 8.1 g/dL   Albumin 4.0 3.5 - 5.0 g/dL   AST 17 15 - 41 U/L  ALT 10 (L) 17 - 63 U/L   Alkaline Phosphatase 85 38 - 126 U/L   Total Bilirubin 0.4 0.3 - 1.2 mg/dL   GFR calc non Af Amer 34 (L) >60 mL/min   GFR calc Af Amer 39 (L) >60 mL/min   Anion gap 12 5 - 15  Urinalysis, Routine w reflex microscopic  Result Value Ref Range   Color, Urine STRAW (A) YELLOW   APPearance CLEAR CLEAR   Specific Gravity, Urine 1.008 1.005 - 1.030   pH 6.0 5.0 - 8.0   Glucose, UA NEGATIVE NEGATIVE mg/dL   Hgb urine dipstick NEGATIVE NEGATIVE   Bilirubin Urine NEGATIVE NEGATIVE   Ketones, ur NEGATIVE NEGATIVE mg/dL   Protein, ur NEGATIVE NEGATIVE mg/dL    Nitrite NEGATIVE NEGATIVE   Leukocytes, UA NEGATIVE NEGATIVE  Troponin I  Result Value Ref Range   Troponin I <0.03 <0.03 ng/mL  Lactic acid, plasma  Result Value Ref Range   Lactic Acid, Venous 2.5 (HH) 0.5 - 1.9 mmol/L  Lactic acid, plasma  Result Value Ref Range   Lactic Acid, Venous 3.3 (HH) 0.5 - 1.9 mmol/L   Dg Chest 2 View Result Date: 11/30/2017 CLINICAL DATA:  Weakness. EXAM: CHEST  2 VIEW COMPARISON:  Radiograph of Apr 11, 2017. CT scan of October 16, 2017. FINDINGS: Stable cardiomediastinal silhouette. Interval placement of left internal jugular Port-A-Cath with distal tip in expected position of the SVC. No pneumothorax is noted. Mild bibasilar subsegmental atelectasis is noted. Calcified pleural plaques are noted on the right. Bony thorax is unremarkable. No significant pleural effusion is noted. IMPRESSION: Mild bibasilar subsegmental atelectasis. Calcified pleural plaques are noted on the right suggesting asbestos exposure. Electronically Signed   By: Marijo Conception, M.D.   On: 11/30/2017 13:44   Ct Head Wo Contrast Result Date: 11/30/2017 CLINICAL DATA:  Difficulty walking for 2 weeks, leg weakness. Assess for stroke. History of lung cancer, stroke, dementia, hypertension, hyperlipidemia, atrial fibrillation, diabetes. EXAM: CT HEAD WITHOUT CONTRAST TECHNIQUE: Contiguous axial images were obtained from the base of the skull through the vertex without intravenous contrast. COMPARISON:  MRI of the head April 22, 2017 FINDINGS: BRAIN: 4.4 x 3.4 centimeter LEFT cerebellar mass with 2.9 x 2.8 centimeter central density. Surrounding vasogenic edema with LEFT-to-RIGHT cerebellar midline shift and partially effaced 4th ventricle. Anterior recess of 3rd ventricle is 11 mm, previously 10 mm. Moderate ventriculomegaly, similar to slightly worse. Old small cerebellar, bilateral basal ganglia and bilateral thalamus lacunar infarcts. Patchy supratentorial white matter hypodensities no acute large  vascular territory infarcts. No abnormal extra-axial fluid collections. Basal cisterns are patent. VASCULAR: Mild calcific atherosclerosis of the carotid siphons.Punctate calcification along course of RIGHT anterior cerebral artery. SKULL: No skull fracture. Multiple vertex calvarial vascular lakes. No significant scalp soft tissue swelling. SINUSES/ORBITS: The mastoid air-cells and included paranasal sinuses are well-aerated.The included ocular globes and orbital contents are non-suspicious. OTHER: Patient is edentulous. IMPRESSION: 1. 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 Differential diagnosis includes subacute hematoma or, less likely metastasis. Recommend MRI of the brain with and without contrast for further characterization. 2. 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 3. Critical Value/emergent results were called by telephone at the time of interpretation on 11/30/2017 at 1:35 pm to Dr. Francine Graven , who verbally acknowledged these results. Electronically Signed   By: Elon Alas M.D.   On: 11/30/2017 13:38   Mr Brain Wo Contrast  Result Date: 11/30/2017 CLINICAL DATA:  Unsteady gait, altered mental status. Follow-up LEFT cerebellar mass. History of lung cancer, stroke, dementia, hypertension, hyperlipidemia, atrial fibrillation, diabetes. EXAM: MRI HEAD WITHOUT CONTRAST TECHNIQUE: Multiplanar, multiecho pulse sequences of the brain and surrounding structures were obtained without intravenous contrast. Due to low GFR, contrast not administered. COMPARISON:  CT HEAD November 30, 2017 at 1312 hours. MRI of the head April 22, 2017. FINDINGS: Multiple sequences are moderately or moderate to severely motion degraded. BRAIN: Complex cystic 4.6 x 3.2 x 2.8 cm (transverse by AP by CC) LEFT cerebellar mass with solid central component and minimal susceptibility artifact inferior margin. No T1  shortening. Extensive surrounding vasogenic edema, LEFT-to-RIGHT cerebellar midline shift. Superior and inferior cerebellar herniation. Partial effacement of fourth ventricle with early suspected obstructive hydrocephalus. No reduced diffusion to suggest acute ischemia or hypercellular tumor. Old bilateral basal ganglia, bilateral thalamus and RIGHT cerebellar small infarcts. Known LEFT cerebellar infarcts obscured by mass. Patchy supratentorial white matter FLAIR T2 hyperintensities. No supratentorial midline shift. No abnormal extra-axial fluid collections. VASCULAR: Dolichoectatic major intracranial vascular flow voids present at skull base. SKULL AND UPPER CERVICAL SPINE: No abnormal sellar expansion. No suspicious calvarial bone marrow signal. Craniocervical junction maintained. SINUSES/ORBITS: Included paranasal sinuses are well aerated. Trace LEFT mastoid effusion. The included ocular globes and orbital contents are non-suspicious. OTHER: None. IMPRESSION: 1. Motion degraded examination. Limited noncontrast assessment due to low GFR. 2. Complex 4.6 x 3.2 x 2.8 LEFT cerebellar mass with imaging characteristics of metastasis, less likely hematoma. 3. 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 4. Moderate chronic small vessel ischemic disease and multiple old small vessel infarcts. 5. Recommend follow-up postcontrast imaging when patient better able to remain still and improved GFR. Electronically Signed   By: Elon Alas M.D.   On: 11/30/2017 15:24    Results for CHIBUIKE, FLEEK (MRN [id number]) as of 11/30/2017 15:32  Ref. Range 06/11/2017 13:05 07/12/2017 09:57 10/16/2017 09:17 11/30/2017 11:40  BUN Latest Ref Range: 6 - 20 mg/dL 16.3 17.4 12.7 25 (H)  Creatinine Latest Ref Range: 0.61 - 1.24 mg/dL 1.5 (H) 1.5 (H) 1.6 (H) 1.87 (H)    1600:  Denies orthostasis on VS. IVF bolus given for elevated lactic acid. Pt is afebrile with normal WBC count. Doubt sepsis.  T/C to [facility] Neurosurgery Dr. Arnoldo Morale, case  discussed, including:  HPI, pertinent PM/SHx, VS/PE, dx testing, ED course and treatment:  He has viewed the MRI images, requests to transfer to [facility] under [facility] service and Neurosurgery can consult.  1620:  T/C to [facility] Dr. Nehemiah Settle, case discussed, including:  HPI, pertinent PM/SHx, VS/PE, dx testing, ED course and treatment:  Agreeable to admit/transfer to [facility].   10:  [facility] Dr. Nehemiah Settle has evaluated pt in the ED: pt and wife have talked and they do not want to be admitted for any intervention, they are aware this decision will likely result in irreversible damage and death. Pt and his wife both are verb understanding, understanding the consequences of their decision. They are both in agreement that they do not want any intervention for his condition and want to go home now. They state they have [place], and Hospice RN is due to come to their home this week. Pt received IVF while in the ED for mildly elevated lactic acid; pt and family do not want any other intervention regarding this finding (ie: hospital admission). Pt and wife both want to go home now.   T/C to Neurosurgery Dr. Arnoldo Morale, case discussed, including  pt and wife now refusing any intervention and wanting to go home: he recommends starting decadron 2mg  PO TID, f/u with Dr. Inda Merlin (pt's Onc MD), if pt and his wife change their mind and want intervention, they can f/u with him in the office.  T/C to [facility] Dr. Nehemiah Settle regarding Neurosurgeon recommendation: states have pt increase insulin to 15 units BID while on the steroid.   1830:  IVF have infused. Pt has tol PO. Pt wants to go home with his family now. Dx and testing d/w pt and family.  Questions answered.  Verb understanding, agreeable to d/c home with outpt f/u.        Final Clinical Impressions(s) / ED Diagnoses   Final diagnoses:  None    ED Discharge Orders    None        Francine Graven, DO 12/02/17 1358

## 2017-11-30 NOTE — ED Notes (Signed)
Date and time results received: 11/30/17 4:27 PM  (use smartphrase ".now" to insert current time)  Test: Lactic Critical Value: 3.3  Name of Provider Notified: Thurnell Garbe  Orders Received? Or Actions Taken?: Orders Received - See Orders for details

## 2017-11-30 NOTE — ED Notes (Signed)
To radiology

## 2017-11-30 NOTE — ED Notes (Signed)
Dr Tonye Becket in to assess  Pt has been in [facility] for the past hour awaiting a bed

## 2017-11-30 NOTE — ED Triage Notes (Signed)
SO reports patient was seen by [facility] doctor, sent to ED for possible stroke. Onset of symptoms 2 weeks ago. Patient has been having difficulty walking.Family reports both legs are weak. Prior hx of CVA.

## 2017-11-30 NOTE — ED Notes (Signed)
Pt and wife discussed admission with Dr Nehemiah Settle  Pt desires to return home as he has told oncologist he is stopping treatment- He is not fearful of future and desires to go home to his recliner and family  Wife states she will contact hospice next week for end of life care

## 2017-11-30 NOTE — Discharge Instructions (Signed)
Take the prescription as directed. The steroid will elevate your blood sugars. Increase your insulin to 15 units twice a day while you are on the steroid.  Call your regular Oncologist on Monday to schedule a follow up appointment within the next 3 days. Call the Neurosurgeon on Monday if you change your mind regarding intervention for your cerebellar mass.  Return to the Emergency Department immediately sooner if worsening.

## 2017-12-02 LAB — URINE CULTURE: Culture: NO GROWTH

## 2018-01-02 ENCOUNTER — Telehealth: Payer: Self-pay | Admitting: Medical Oncology

## 2018-01-02 NOTE — Telephone Encounter (Signed)
Wife called that M[place]on passed away last 05-Feb-2023.

## 2018-01-11 DEATH — deceased

## 2018-01-14 ENCOUNTER — Ambulatory Visit (HOSPITAL_COMMUNITY): Payer: Medicare HMO

## 2018-01-14 ENCOUNTER — Other Ambulatory Visit: Payer: Medicare HMO

## 2018-01-16 ENCOUNTER — Ambulatory Visit: Payer: Medicare HMO | Admitting: Internal Medicine

## 2018-04-24 ENCOUNTER — Other Ambulatory Visit (HOSPITAL_COMMUNITY): Payer: Medicare HMO

## 2018-04-24 ENCOUNTER — Ambulatory Visit: Payer: Medicare HMO | Admitting: Vascular Surgery

## 2018-07-16 IMAGING — NM NM MISC PROCEDURE
6 series · 36 of 36 positions shown · non-contrast
Comparison: none

[Series 1: wbr rest · 6.40mm/px · 6 of 64 frames shown]
[frame 6/64]
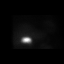
[frame 16/64]
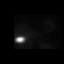
[frame 27/64]
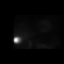
[frame 38/64]
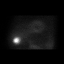
[frame 48/64]
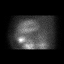
[frame 59/64]
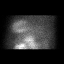

[Series 1: wbr_r-proj_st wbr rest · 6.40mm/px · 6 of 64 frames shown]
[frame 6/64]
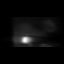
[frame 16/64]
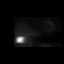
[frame 27/64]
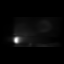
[frame 38/64]
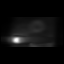
[frame 48/64]
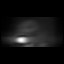
[frame 59/64]
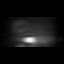

[Series 2: wbr_s-proj_st wbr stress-gsp · 6.40mm/px · 6 of 512 frames shown]
[frame 43/512]
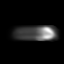
[frame 128/512]
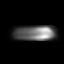
[frame 214/512]
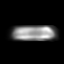
[frame 299/512]
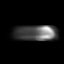
[frame 384/512]
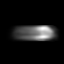
[frame 470/512]
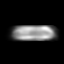

[Series 2: wbr stress-gsp · 6.40mm/px · 6 of 512 frames shown]
[frame 43/512]
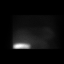
[frame 128/512]
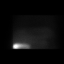
[frame 214/512]
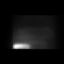
[frame 299/512]
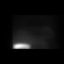
[frame 384/512]
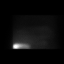
[frame 470/512]
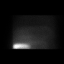

[Series 3: wbr_s-proj_st wbr stress-sum-em · 6.40mm/px · 6 of 64 frames shown]
[frame 6/64]
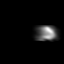
[frame 16/64]
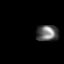
[frame 27/64]
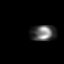
[frame 38/64]
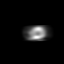
[frame 48/64]
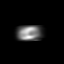
[frame 59/64]
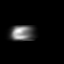

[Series 3: wbr stress-sum-em · 6.40mm/px · 6 of 64 frames shown]
[frame 6/64]
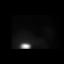
[frame 16/64]
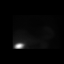
[frame 27/64]
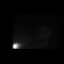
[frame 38/64]
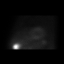
[frame 48/64]
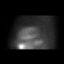
[frame 59/64]
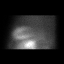

[36 of 36 positions shown; findings below may reference images not displayed]

Canned report from images found in remote index.

Refer to host system for actual result text.

## 2018-07-30 ENCOUNTER — Ambulatory Visit (INDEPENDENT_AMBULATORY_CARE_PROVIDER_SITE_OTHER): Payer: Medicare HMO | Admitting: Internal Medicine
